# Patient Record
Sex: Female | Born: 1990
Health system: Southern US, Community
[De-identification: ages and names within clinical notes are randomized; demographics above are authoritative.]

## PROBLEM LIST (undated history)

## (undated) DIAGNOSIS — Z91018 Allergy to other foods: Secondary | ICD-10-CM

## (undated) DIAGNOSIS — E282 Polycystic ovarian syndrome: Secondary | ICD-10-CM

## (undated) DIAGNOSIS — J309 Allergic rhinitis, unspecified: Secondary | ICD-10-CM

## (undated) DIAGNOSIS — A749 Chlamydial infection, unspecified: Secondary | ICD-10-CM

## (undated) DIAGNOSIS — O139 Gestational [pregnancy-induced] hypertension without significant proteinuria, unspecified trimester: Secondary | ICD-10-CM

## (undated) DIAGNOSIS — Z789 Other specified health status: Secondary | ICD-10-CM

## (undated) DIAGNOSIS — N979 Female infertility, unspecified: Secondary | ICD-10-CM

## (undated) HISTORY — DX: Allergy to other foods: Z91.018

## (undated) HISTORY — DX: Female infertility, unspecified: N97.9

## (undated) HISTORY — DX: Allergic rhinitis, unspecified: J30.9

## (undated) HISTORY — DX: Gestational (pregnancy-induced) hypertension without significant proteinuria, unspecified trimester: O13.9

---
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
## 2014-02-21 ENCOUNTER — Encounter (HOSPITAL_COMMUNITY): Payer: Self-pay | Admitting: *Deleted

## 2014-09-28 LAB — HM PAP SMEAR: HM Pap smear: NEGATIVE

## 2015-01-02 ENCOUNTER — Other Ambulatory Visit: Payer: Self-pay | Admitting: Family Medicine

## 2015-01-02 ENCOUNTER — Telehealth: Payer: Self-pay | Admitting: Family Medicine

## 2015-01-02 DIAGNOSIS — T753XXA Motion sickness, initial encounter: Secondary | ICD-10-CM

## 2015-01-02 MED ORDER — SCOPOLAMINE 1 MG/3DAYS TD PT72: 1.0000 | MEDICATED_PATCH | TRANSDERMAL | Status: DC

## 2015-01-02 NOTE — Telephone Encounter (Signed)
Pt states she is going on a 8 day cruise next week and is requesting a Rx for the sea sick patches.  [facility] [address]  ZO#[phone]/WJ

## 2015-01-02 NOTE — Telephone Encounter (Signed)
Prescription request for motion sickness. KW

## 2015-03-22 ENCOUNTER — Telehealth: Payer: Self-pay

## 2015-03-22 NOTE — Telephone Encounter (Signed)
Patient is requesting a [facility] [address] Patient is going on a cruise on Sunday.  CB [phone]

## 2015-03-23 ENCOUNTER — Other Ambulatory Visit: Payer: Self-pay | Admitting: Family Medicine

## 2015-03-23 DIAGNOSIS — T753XXA Motion sickness, initial encounter: Secondary | ICD-10-CM

## 2015-03-23 MED ORDER — SCOPOLAMINE 1 MG/3DAYS TD PT72: 1.0000 | MEDICATED_PATCH | TRANSDERMAL | Status: DC

## 2015-03-23 NOTE — Telephone Encounter (Signed)
Refill sent in

## 2015-10-12 DIAGNOSIS — L718 Other rosacea: Secondary | ICD-10-CM | POA: Insufficient documentation

## 2015-10-12 DIAGNOSIS — J309 Allergic rhinitis, unspecified: Secondary | ICD-10-CM | POA: Insufficient documentation

## 2016-05-10 ENCOUNTER — Telehealth: Payer: Self-pay | Admitting: Family Medicine

## 2016-05-10 ENCOUNTER — Other Ambulatory Visit: Payer: Self-pay | Admitting: Family Medicine

## 2016-05-10 MED ORDER — EPINEPHRINE 0.3 MG/0.3ML IJ SOAJ: 0.3000 mg | Freq: Once | INTRAMUSCULAR | 0 refills | Status: AC

## 2016-05-10 NOTE — Telephone Encounter (Signed)
Patient was advised, patient also wants to know if she would need any "entry shots" to go to [place]?

## 2016-05-10 NOTE — Telephone Encounter (Signed)
Pt advised-aa 

## 2016-05-10 NOTE — Telephone Encounter (Signed)
Please review-aa 

## 2016-05-10 NOTE — Telephone Encounter (Signed)
Pt has not been seen since 08/11/2013.  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  Pt states she is going on her honeymoon and want to take this with her just in case she needs it.  [facility] [address]  AV#[phone]/WGCB#[phone]/MW

## 2016-05-10 NOTE — Telephone Encounter (Signed)
Refill of epipen completed. Please let patient know.

## 2016-05-10 NOTE — Telephone Encounter (Signed)
[facility] need [facility] for those answers.

## 2016-09-19 ENCOUNTER — Telehealth: Payer: Self-pay | Admitting: Family Medicine

## 2016-09-19 NOTE — Telephone Encounter (Signed)
Prefer not to accept her or new patients at this time.

## 2016-09-19 NOTE — Telephone Encounter (Signed)
Pt was last seen in our office 08/11/13 and is requesting to reestablish with Nadine CountsBob.  Pt is requesting a CPE.  Pt has [employer] insurance.  Pt has not been seen at any other office.  Please advise.  QI#[phone]/LKCB#[phone]/MW

## 2016-09-19 NOTE — Telephone Encounter (Signed)
Left message/MW °

## 2016-11-01 DIAGNOSIS — Z130 Encounter for screening for diseases of the blood and blood-forming organs and certain disorders involving the immune mechanism: Secondary | ICD-10-CM | POA: Diagnosis not present

## 2016-11-01 DIAGNOSIS — Z01419 Encounter for gynecological examination (general) (routine) without abnormal findings: Secondary | ICD-10-CM | POA: Diagnosis not present

## 2016-11-01 DIAGNOSIS — Z1389 Encounter for screening for other disorder: Secondary | ICD-10-CM | POA: Diagnosis not present

## 2016-11-01 DIAGNOSIS — Z6829 Body mass index (BMI) 29.0-29.9, adult: Secondary | ICD-10-CM | POA: Diagnosis not present

## 2016-11-01 DIAGNOSIS — Z30431 Encounter for routine checking of intrauterine contraceptive device: Secondary | ICD-10-CM | POA: Diagnosis not present

## 2016-11-04 DIAGNOSIS — M9905 Segmental and somatic dysfunction of pelvic region: Secondary | ICD-10-CM | POA: Diagnosis not present

## 2016-11-04 DIAGNOSIS — M5386 Other specified dorsopathies, lumbar region: Secondary | ICD-10-CM | POA: Diagnosis not present

## 2016-11-04 DIAGNOSIS — M9903 Segmental and somatic dysfunction of lumbar region: Secondary | ICD-10-CM | POA: Diagnosis not present

## 2016-11-04 DIAGNOSIS — M9902 Segmental and somatic dysfunction of thoracic region: Secondary | ICD-10-CM | POA: Diagnosis not present

## 2016-11-05 DIAGNOSIS — M9903 Segmental and somatic dysfunction of lumbar region: Secondary | ICD-10-CM | POA: Diagnosis not present

## 2016-11-05 DIAGNOSIS — M9902 Segmental and somatic dysfunction of thoracic region: Secondary | ICD-10-CM | POA: Diagnosis not present

## 2016-11-05 DIAGNOSIS — M9905 Segmental and somatic dysfunction of pelvic region: Secondary | ICD-10-CM | POA: Diagnosis not present

## 2016-11-05 DIAGNOSIS — M5386 Other specified dorsopathies, lumbar region: Secondary | ICD-10-CM | POA: Diagnosis not present

## 2016-11-21 DIAGNOSIS — Z209 Contact with and (suspected) exposure to unspecified communicable disease: Secondary | ICD-10-CM | POA: Diagnosis not present

## 2017-02-03 ENCOUNTER — Ambulatory Visit (INDEPENDENT_AMBULATORY_CARE_PROVIDER_SITE_OTHER): Payer: BLUE CROSS/BLUE SHIELD | Admitting: Family Medicine

## 2017-02-03 ENCOUNTER — Encounter: Payer: Self-pay | Admitting: Family Medicine

## 2017-02-03 VITALS — BP 102/70 | HR 92 | Temp 97.5°F | Resp 16 | Ht 62.0 in | Wt 169.0 lb

## 2017-02-03 DIAGNOSIS — J3089 Other allergic rhinitis: Secondary | ICD-10-CM | POA: Diagnosis not present

## 2017-02-03 DIAGNOSIS — Z7689 Persons encountering health services in other specified circumstances: Secondary | ICD-10-CM | POA: Diagnosis not present

## 2017-02-03 DIAGNOSIS — F5105 Insomnia due to other mental disorder: Secondary | ICD-10-CM

## 2017-02-03 DIAGNOSIS — F99 Mental disorder, not otherwise specified: Secondary | ICD-10-CM | POA: Diagnosis not present

## 2017-02-03 DIAGNOSIS — F411 Generalized anxiety disorder: Secondary | ICD-10-CM | POA: Diagnosis not present

## 2017-02-03 DIAGNOSIS — Z23 Encounter for immunization: Secondary | ICD-10-CM | POA: Diagnosis not present

## 2017-02-03 DIAGNOSIS — G4700 Insomnia, unspecified: Secondary | ICD-10-CM | POA: Insufficient documentation

## 2017-02-03 LAB — CBC WITH DIFFERENTIAL/PLATELET
BASOS ABS: 69 {cells}/uL (ref 0–200)
Basophils Relative: 1.3 %
EOS ABS: 265 {cells}/uL (ref 15–500)
Eosinophils Relative: 5 %
HCT: 42.1 % (ref 35.0–45.0)
HEMOGLOBIN: 14.4 g/dL (ref 11.7–15.5)
Lymphs Abs: 1776 cells/uL (ref 850–3900)
MCH: 28.5 pg (ref 27.0–33.0)
MCHC: 34.2 g/dL (ref 32.0–36.0)
MCV: 83.2 fL (ref 80.0–100.0)
MPV: 9.3 fL (ref 7.5–12.5)
Monocytes Relative: 8.6 %
NEUTROS ABS: 2735 {cells}/uL (ref 1500–7800)
NEUTROS PCT: 51.6 %
Platelets: 405 10*3/uL — ABNORMAL HIGH (ref 140–400)
RBC: 5.06 10*6/uL (ref 3.80–5.10)
RDW: 12.4 % (ref 11.0–15.0)
TOTAL LYMPHOCYTE: 33.5 %
WBC mixed population: 456 cells/uL (ref 200–950)
WBC: 5.3 10*3/uL (ref 3.8–10.8)

## 2017-02-03 LAB — COMPLETE METABOLIC PANEL WITH GFR
AG Ratio: 1.4 (calc) (ref 1.0–2.5)
ALBUMIN MSPROF: 4.4 g/dL (ref 3.6–5.1)
ALKALINE PHOSPHATASE (APISO): 51 U/L (ref 33–115)
ALT: 16 U/L (ref 6–29)
AST: 17 U/L (ref 10–30)
BILIRUBIN TOTAL: 0.6 mg/dL (ref 0.2–1.2)
BUN: 11 mg/dL (ref 7–25)
CHLORIDE: 103 mmol/L (ref 98–110)
CO2: 27 mmol/L (ref 20–32)
Calcium: 9.6 mg/dL (ref 8.6–10.2)
Creat: 0.75 mg/dL (ref 0.50–1.10)
GFR, Est African American: 129 mL/min/{1.73_m2} (ref >=60)
GFR, Est Non African American: 112 mL/min/{1.73_m2} (ref >=60)
Globulin: 3.1 g/dL (calc) (ref 1.9–3.7)
Glucose, Bld: 81 mg/dL (ref 65–99)
Potassium: 4.2 mmol/L (ref 3.5–5.3)
SODIUM: 140 mmol/L (ref 135–146)
Total Protein: 7.5 g/dL (ref 6.1–8.1)

## 2017-02-03 LAB — TSH: TSH: 2.12 m[IU]/L

## 2017-02-03 MED ORDER — SERTRALINE HCL 50 MG PO TABS: 50.0000 mg | ORAL_TABLET | Freq: Every day | ORAL | 3 refills | Status: DC

## 2017-02-03 MED ORDER — TRAZODONE HCL 50 MG PO TABS: 25.0000 mg | ORAL_TABLET | Freq: Every evening | ORAL | 3 refills | Status: DC | PRN

## 2017-02-03 NOTE — Assessment & Plan Note (Signed)
Uncontrolled and severe (see GAD score) Discussed therapy and synregistic effects with medication Discussed medication options Start SSRI - Zoloft and slowly titrate dose (see AVS) Contracted for safety Discussed potential side effects F/u in 1 month and consider further dose titration

## 2017-02-03 NOTE — Patient Instructions (Signed)
Start with Zoloft  daily and increase to  daily after 1 week  Can take Trazodone as needed for sleep.  Take 0.5-1 pill 1-2 hours before bedtime as needed.   Generalized Anxiety Disorder, Adult Generalized anxiety disorder (GAD) is a mental health disorder. People with this condition constantly worry about everyday events. Unlike normal anxiety, worry related to GAD is not triggered by a specific event. These worries also do not fade or get better with time. GAD interferes with life functions, including relationships, work, and school. GAD can vary from mild to severe. People with severe GAD can have intense waves of anxiety with physical symptoms (panic attacks). What are the causes? The exact cause of GAD is not known. What increases the risk? This condition is more likely to develop in:  Women.  People who have a family history of anxiety disorders.  People who are very shy.  People who experience very stressful life events, such as the death of a loved one.  People who have a very stressful family environment.  What are the signs or symptoms? People with GAD often worry excessively about many things in their lives, such as their health and family. They may also be overly concerned about:  Doing well at work.  Being on time.  Natural disasters.  Friendships.  Physical symptoms of GAD include:  Fatigue.  Muscle tension or having muscle twitches.  Trembling or feeling shaky.  Being easily startled.  Feeling like your heart is pounding or racing.  Feeling out of breath or like you cannot take a deep breath.  Having trouble falling asleep or staying asleep.  Sweating.  Nausea, diarrhea, or irritable bowel syndrome (IBS).  Headaches.  Trouble concentrating or remembering facts.  Restlessness.  Irritability.  How is this diagnosed? Your health care provider can diagnose GAD based on your symptoms and medical history. You will also have a physical  exam. The health care provider will ask specific questions about your symptoms, including how severe they are, when they started, and if they come and go. Your health care provider may ask you about your use of alcohol or drugs, including prescription medicines. Your health care provider may refer you to a mental health specialist for further evaluation. Your health care provider will do a thorough examination and may perform additional tests to rule out other possible causes of your symptoms. To be diagnosed with GAD, a person must have anxiety that:  Is out of his or her control.  Affects several different aspects of his or her life, such as work and relationships.  Causes distress that makes him or her unable to take part in normal activities.  Includes at least three physical symptoms of GAD, such as restlessness, fatigue, trouble concentrating, irritability, muscle tension, or sleep problems.  Before your health care provider can confirm a diagnosis of GAD, these symptoms must be present more days than they are not, and they must last for six months or longer. How is this treated? The following therapies are usually used to treat GAD:  Medicine. Antidepressant medicine is usually prescribed for long-term daily control. Antianxiety medicines may be added in severe cases, especially when panic attacks occur.  Talk therapy (psychotherapy). Certain types of talk therapy can be helpful in treating GAD by providing support, education, and guidance. Options include: ? Cognitive behavioral therapy (CBT). People learn coping skills and techniques to ease their anxiety. They learn to identify unrealistic or negative thoughts and behaviors and to replace them with  positive ones. ? Acceptance and commitment therapy (ACT). This treatment teaches people how to be mindful as a way to cope with unwanted thoughts and feelings. ? Biofeedback. This process trains you to manage your body's response  (physiological response) through breathing techniques and relaxation methods. You will work with a therapist while machines are used to monitor your physical symptoms.  Stress management techniques. These include yoga, meditation, and exercise.  A mental health specialist can help determine which treatment is best for you. Some people see improvement with one type of therapy. However, other people require a combination of therapies. Follow these instructions at home:  Take over-the-counter and prescription medicines only as told by your health care provider.  Try to maintain a normal routine.  Try to anticipate stressful situations and allow extra time to manage them.  Practice any stress management or self-calming techniques as taught by your health care provider.  Do not punish yourself for setbacks or for not making progress.  Try to recognize your accomplishments, even if they are small.  Keep all follow-up visits as told by your health care provider. This is important. Contact a health care provider if:  Your symptoms do not get better.  Your symptoms get worse.  You have signs of depression, such as: ? A persistently sad, cranky, or irritable mood. ? Loss of enjoyment in activities that used to bring you joy. ? Change in weight or eating. ? Changes in sleeping habits. ? Avoiding friends or family members. ? Loss of energy for normal tasks. ? Feelings of guilt or worthlessness. Get help right away if:  You have serious thoughts about hurting yourself or others. If you ever feel like you may hurt yourself or others, or have thoughts about taking your own life, get help right away. You can go to your nearest emergency department or call:  Your local emergency services (911 in the [place]  A suicide crisis helpline, such as the National Suicide Prevention Lifeline at 904-299-7300. This is open 24 hours a day.  Summary  Generalized anxiety disorder (GAD) is a mental  health disorder that involves worry that is not triggered by a specific event.  People with GAD often worry excessively about many things in their lives, such as their health and family.  GAD may cause physical symptoms such as restlessness, trouble concentrating, sleep problems, frequent sweating, nausea, diarrhea, headaches, and trembling or muscle twitching.  A mental health specialist can help determine which treatment is best for you. Some people see improvement with one type of therapy. However, other people require a combination of therapies. This information is not intended to replace advice given to you by your health care provider. Make sure you discuss any questions you have with your health care provider. Document Released: 08/03/2012 Document Revised: 02/27/2016 Document Reviewed: 02/27/2016 Elsevier Interactive Patient Education  [employer].

## 2017-02-03 NOTE — Assessment & Plan Note (Signed)
Most likely due to anxiety Discussed sleep hygiene Trial of trazodone prn F/u in 1 month Plan in the future to d/c trazodone after anxiety well controlled with SSRI

## 2017-02-03 NOTE — Assessment & Plan Note (Signed)
Well controlled Continue prn antihistamine

## 2017-02-03 NOTE — Progress Notes (Signed)
Patient: Samantha Bailey Female    DOB: 08/08/1990   26 y.o.   MRN: [id number] Visit Date: 02/03/2017  Today's Provider: Shirlee Latch, MD   Chief Complaint  Patient presents with  . Anxiety   Subjective:      Samantha Bailey presents to reestablish care. She previously saw Samantha Arthurs, PA for acute issues. She is c/o anxiety today.  Anxiety  Presents for initial visit. 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 The problem has been gradually worsening. Symptoms include decreased concentration, excessive worry, insomnia, irritability, nervous/anxious behavior and restlessness. Patient reports no chest pain (is c/o chest heaviness), compulsions, confusion, depressed mood, dizziness, dry mouth, feeling of choking, hyperventilation, impotence, malaise, muscle tension, nausea, obsessions, palpitations, panic, shortness of breath or suicidal ideas. The severity of symptoms is mild. Exacerbated by: nursing school stress. The quality of sleep is fair (Sometimes sleep is non-restorative).   There is no history of anemia, anxiety/panic attacks, depression or suicide attempts. Past treatments include nothing.   Feels like school is overwhelming.  Small things like dog having an accident make her cry.  Lays in bed worried about upcoming events and studying.  Feels guilty any time she is not studying.  Has always had trouble concentrating, but it has never affected her life or her grades.   No Known Allergies   Current Outpatient Prescriptions:  .  Levonorgestrel 13.5 MG IUD, 13.5 mg by Intrauterine route once., Disp: , Rfl:  .  sertraline (ZOLOFT) 50 MG tablet, Take 1 tablet (50 mg total) by mouth daily., Disp: 30 tablet, Rfl: 3 .  traZODone (DESYREL) 50 MG tablet, Take 0.5-1 tablets (25-50 mg total) by mouth at bedtime as needed for sleep., Disp: 30 tablet, Rfl: 3  Review of Systems  Constitutional: Positive for irritability. Negative for activity change, appetite change, chills,  diaphoresis, fatigue, fever and unexpected weight change.  HENT: Negative.   Eyes: Negative.   Respiratory: Negative.  Negative for shortness of breath.   Cardiovascular: Negative.  Negative for chest pain (is c/o chest heaviness) and palpitations.  Gastrointestinal: Negative.  Negative for nausea.  Endocrine: Negative.   Genitourinary: Negative.  Negative for impotence.  Musculoskeletal: Negative.   Skin: Negative.   Allergic/Immunologic: Negative.   Neurological: Negative.  Negative for dizziness.  Hematological: Negative.   Psychiatric/Behavioral: Positive for decreased concentration and sleep disturbance. Negative for agitation, behavioral problems, confusion, dysphoric mood, hallucinations, self-injury and suicidal ideas. The patient is nervous/anxious and has insomnia. The patient is not hyperactive.    Past Medical History:  Diagnosis Date  . Allergic rhinitis   . Chlamydia 2008   Past Surgical History:  Procedure Laterality Date  . WISDOM TOOTH EXTRACTION  2013   no problems with anesthesia   Family History  Problem Relation Age of Onset  . Healthy Mother   . Healthy Father   . Breast cancer Neg Hx   . Cancer - Colon Neg Hx   . Cervical cancer Neg Hx     Social History  Substance Use Topics  . Smoking status: Never Smoker  . Smokeless tobacco: Never Used  . Alcohol use No   Objective:   BP 102/70 (BP Location: Left Arm, Patient Position: Sitting, Cuff Size: Normal)   Pulse 92   Temp (!) 97.5 F (36.4 C) (Oral)   Resp 16   Ht  (1.575 m)   Wt 169 lb (76.7 kg)   Breastfeeding? Unknown   BMI 30.91 kg/m  Vitals:  02/03/17 1100  BP: 102/70  Pulse: 92  Resp: 16  Temp: (!) 97.5 F (36.4 C)  TempSrc: Oral  Weight: 169 lb (76.7 kg)  Height:  (1.575 m)     Physical Exam  Constitutional: She is oriented to person, place, and time. She appears well-developed and well-nourished. No distress.  HENT:  Head: Normocephalic and atraumatic.  Right  Ear: External ear normal.  Left Ear: External ear normal.  Nose: Nose normal.  Mouth/Throat: Oropharynx is clear and moist. No oropharyngeal exudate.  Eyes: Pupils are equal, round, and reactive to light. Conjunctivae are normal. No scleral icterus.  Neck: Neck supple. No thyromegaly present.  Cardiovascular: Normal rate, regular rhythm, normal heart sounds and intact distal pulses.   No murmur heard. Pulmonary/Chest: Effort normal and breath sounds normal. No respiratory distress. She has no wheezes. She has no rales.  Abdominal: Soft. Bowel sounds are normal. She exhibits no distension. There is no tenderness. There is no rebound and no guarding.  Musculoskeletal: She exhibits no edema or deformity.  Lymphadenopathy:    She has no cervical adenopathy.  Neurological: She is alert and oriented to person, place, and time.  Skin: Skin is warm and dry. No rash noted.  Psychiatric: Her behavior is normal.  Anxious. Appropriate groom and dress. No SI/HI.  No evidence of AVH  Vitals reviewed.    Depression screen PHQ 2/9 02/03/2017  Decreased Interest 0  Down, Depressed, Hopeless 0  PHQ - 2 Score 0    GAD 7 : Generalized Anxiety Score 02/03/2017  Nervous, Anxious, on Edge 3  Control/stop worrying 3  Worry too much - different things 3  Trouble relaxing 3  Restless 1  Easily annoyed or irritable 3  Afraid - awful might happen 3  Total GAD 7 Score 19  Anxiety Difficulty Extremely difficult        Assessment & Plan:      Problem List Items Addressed This Visit      Respiratory   Allergic rhinitis    Well controlled Continue prn antihistamine        Other   GAD (generalized anxiety disorder)    Uncontrolled and severe (see GAD score) Discussed therapy and synregistic effects with medication Discussed medication options Start SSRI - Zoloft and slowly titrate dose (see AVS) Contracted for safety Discussed potential side effects F/u in 1 month and consider further dose  titration      Relevant Medications   sertraline (ZOLOFT) 50 MG tablet   traZODone (DESYREL) 50 MG tablet   Other Relevant Orders   CBC w/Diff/Platelet   COMPLETE METABOLIC PANEL WITH GFR   TSH   Insomnia    Most likely due to anxiety Discussed sleep hygiene Trial of trazodone prn F/u in 1 month Plan in the future to d/c trazodone after anxiety well controlled with SSRI       Other Visit Diagnoses    Encounter to establish care    -  Primary   Relevant Orders   CBC w/Diff/Platelet   COMPLETE METABOLIC PANEL WITH GFR   TSH   Need for immunization against influenza       Relevant Orders   Flu Vaccine QUAD 36+ mos IM (Completed)      Return in about 4 weeks (around 03/03/2017) for anxiety.      The entirety of the information documented in the History of Present Illness, Review of Systems and Physical Exam were personally obtained by me. Portions of this information were  initially documented by Martha Clan, CMA and reviewed by me for thoroughness and accuracy.     Lavon Paganini, MD  [facility]

## 2017-02-17 ENCOUNTER — Telehealth: Payer: Self-pay | Admitting: Family Medicine

## 2017-02-17 DIAGNOSIS — F321 Major depressive disorder, single episode, moderate: Secondary | ICD-10-CM

## 2017-02-17 MED ORDER — BUPROPION HCL ER (XL) 150 MG PO TB24: 150.0000 mg | ORAL_TABLET | Freq: Every day | ORAL | 0 refills | Status: DC

## 2017-02-17 NOTE — Telephone Encounter (Signed)
Can you please review 

## 2017-02-17 NOTE — Telephone Encounter (Signed)
Left message advising pt. 

## 2017-02-17 NOTE — Telephone Encounter (Signed)
Pt requesting a change from Zoloft to Wellbutrin.  Pt states the Zoloft is making her sleepy, pt states she has tried taking in the morning and at night and there is no difference in the sleepiness.    The patient states she has not taken any of the sleeping pills that were prescribed.

## 2017-02-17 NOTE — Telephone Encounter (Signed)
I am willing to switch to wellbutrin but patient will have to monitor as it may worsen anxiety.

## 2017-03-04 ENCOUNTER — Ambulatory Visit: Payer: Self-pay | Admitting: Family Medicine

## 2017-03-06 ENCOUNTER — Encounter: Payer: Self-pay | Admitting: Emergency Medicine

## 2017-03-07 ENCOUNTER — Telehealth: Payer: Self-pay

## 2017-03-07 MED ORDER — EPINEPHRINE 0.3 MG/0.3ML IJ SOAJ: 0.3000 mg | Freq: Once | INTRAMUSCULAR | 1 refills | Status: AC

## 2017-03-07 NOTE — Telephone Encounter (Signed)
Rx sent to pharmacy   

## 2017-03-07 NOTE — Telephone Encounter (Signed)
Patient called stating that she has Epi pen - usually 2 pens per box, for shell fish allergy. Prescription expired and she wanted to see if we could refill it and [facility] [address] [place]

## 2017-07-29 ENCOUNTER — Telehealth: Payer: Self-pay | Admitting: Family Medicine

## 2017-07-29 NOTE — Telephone Encounter (Signed)
[facility]

## 2017-11-04 DIAGNOSIS — Z124 Encounter for screening for malignant neoplasm of cervix: Secondary | ICD-10-CM | POA: Diagnosis not present

## 2017-11-04 DIAGNOSIS — Z01419 Encounter for gynecological examination (general) (routine) without abnormal findings: Secondary | ICD-10-CM | POA: Diagnosis not present

## 2017-11-04 DIAGNOSIS — Z130 Encounter for screening for diseases of the blood and blood-forming organs and certain disorders involving the immune mechanism: Secondary | ICD-10-CM | POA: Diagnosis not present

## 2017-11-04 DIAGNOSIS — Z30431 Encounter for routine checking of intrauterine contraceptive device: Secondary | ICD-10-CM | POA: Diagnosis not present

## 2017-11-04 DIAGNOSIS — N393 Stress incontinence (female) (male): Secondary | ICD-10-CM | POA: Diagnosis not present

## 2017-11-04 DIAGNOSIS — Z368A Encounter for antenatal screening for other genetic defects: Secondary | ICD-10-CM | POA: Diagnosis not present

## 2017-11-04 DIAGNOSIS — Z1389 Encounter for screening for other disorder: Secondary | ICD-10-CM | POA: Diagnosis not present

## 2017-11-04 LAB — HM PAP SMEAR: HM Pap smear: NEGATIVE

## 2018-02-23 DIAGNOSIS — H6123 Impacted cerumen, bilateral: Secondary | ICD-10-CM | POA: Diagnosis not present

## 2018-02-23 DIAGNOSIS — J358 Other chronic diseases of tonsils and adenoids: Secondary | ICD-10-CM | POA: Diagnosis not present

## 2018-02-23 DIAGNOSIS — H6060 Unspecified chronic otitis externa, unspecified ear: Secondary | ICD-10-CM | POA: Diagnosis not present

## 2018-02-23 DIAGNOSIS — R42 Dizziness and giddiness: Secondary | ICD-10-CM | POA: Diagnosis not present

## 2018-03-30 ENCOUNTER — Telehealth: Payer: Self-pay | Admitting: Family Medicine

## 2018-03-30 NOTE — Telephone Encounter (Signed)
This is fine, please schedule for physical and PAP as she is due.

## 2018-03-30 NOTE — Telephone Encounter (Signed)
Pt wanting to move PCP care from Dr. Leonard SchwartzB to Osvaldo AngstAdriana Pollak.  Pt feels Dr. B wasn't a good fit for her to continue PCP care.   Pt is now needing a CPE and wants to get the appointment with Adriana.  Please advise.  Thanks, [employer]

## 2018-03-30 NOTE — Telephone Encounter (Signed)
That's fine, but Ricki Rodriguezdriana will need to confirm.  Patient was supposed to f/u in 1 month last October for anxiety, of note.  Erasmo DownerBacigalupo, Keilyn Nadal M, MD, MPH [facility] 03/30/2018 3:06 PM

## 2018-03-31 NOTE — Telephone Encounter (Signed)
Patient was advised and schedule appointment on 04/08/2018.

## 2018-04-08 ENCOUNTER — Ambulatory Visit (INDEPENDENT_AMBULATORY_CARE_PROVIDER_SITE_OTHER): Payer: BLUE CROSS/BLUE SHIELD | Admitting: Physician Assistant

## 2018-04-08 ENCOUNTER — Encounter: Payer: Self-pay | Admitting: Physician Assistant

## 2018-04-08 VITALS — BP 116/78 | HR 94 | Temp 97.9°F | Wt 177.0 lb

## 2018-04-08 DIAGNOSIS — Z131 Encounter for screening for diabetes mellitus: Secondary | ICD-10-CM

## 2018-04-08 DIAGNOSIS — Z0000 Encounter for general adult medical examination without abnormal findings: Secondary | ICD-10-CM | POA: Diagnosis not present

## 2018-04-08 DIAGNOSIS — Z13220 Encounter for screening for lipoid disorders: Secondary | ICD-10-CM

## 2018-04-08 DIAGNOSIS — Z130 Encounter for screening for diseases of the blood and blood-forming organs and certain disorders involving the immune mechanism: Secondary | ICD-10-CM | POA: Diagnosis not present

## 2018-04-08 DIAGNOSIS — Z111 Encounter for screening for respiratory tuberculosis: Secondary | ICD-10-CM

## 2018-04-08 DIAGNOSIS — T7802XD Anaphylactic reaction due to shellfish (crustaceans), subsequent encounter: Secondary | ICD-10-CM

## 2018-04-08 DIAGNOSIS — Z1329 Encounter for screening for other suspected endocrine disorder: Secondary | ICD-10-CM | POA: Diagnosis not present

## 2018-04-08 DIAGNOSIS — Z23 Encounter for immunization: Secondary | ICD-10-CM | POA: Diagnosis not present

## 2018-04-08 DIAGNOSIS — Z114 Encounter for screening for human immunodeficiency virus [HIV]: Secondary | ICD-10-CM

## 2018-04-08 MED ORDER — EPINEPHRINE 0.3 MG/0.3ML IJ SOAJ: 0.3000 mg | Freq: Once | INTRAMUSCULAR | 0 refills | Status: AC

## 2018-04-08 NOTE — Progress Notes (Signed)
Patient: Samantha Bailey, Female    DOB: 1990/06/12, 27 y.o.   MRN: [id number] Visit Date: 04/08/2018  Today's Provider: Trinna Post, PA-C   Chief Complaint  Patient presents with  . Annual Exam   Subjective:    Annual physical exam Samantha Bailey is a 27 y.o. female who presents today for health maintenance and complete physical. She feels well. She reports exercising includes walking. She reports she is sleeping well.  She is living in [place]. Has been married two years. She has no children. She has an IUD placed by [facility]. She had a PAP last year from this provider as well. She reports it was normal.   She is due for tetanus and a flu shot today.   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 She will finish in July. She needs a TB test for this.   Last year when she was very stressed with nursing school she was prescribed trazodone and wellbutrin. She reports she has not needed them and is not taking them, it was situational and she feels a great deal better now.   Reports she has an allergy to shellfish, throat becomes itchy, happened when she was a little girl. She has been allergy tested. Has expired epi-pen that she has never had to use. Reports she reached out to ENT but they told her she needed new allergy testing to renew the script. Requesting epi-pen today.   -----------------------------------------------------------------   Review of Systems  Constitutional: Negative.   HENT: Negative.   Eyes: Negative.   Respiratory: Negative.   Cardiovascular: Negative.   Gastrointestinal: Negative.   Endocrine: Negative.   Genitourinary: Negative.   Musculoskeletal: Negative.   Skin: Negative.   Allergic/Immunologic: Positive for food allergies.  Neurological: Negative.   Hematological: Negative.   Psychiatric/Behavioral: Negative.     Social History She  reports that she has  never smoked. She has never used smokeless tobacco. She reports that she does not drink alcohol or use drugs. Social History   Socioeconomic History  . Marital status: Married    Spouse name: Broadus John  . Number of children: 0  . Years of education: college  . Highest education level: Not on file  Occupational History    Employer: Bartender    Comment: [employer]  . 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 Financial resource strain: Not on file  . Food insecurity:    Worry: Not on file    Inability: Not on file  . Transportation needs:    Medical: Not on file    Non-medical: Not on file  Tobacco Use  . Smoking status: Never Smoker  . Smokeless tobacco: Never Used  Substance and Sexual Activity  . Alcohol use: No  . Drug use: No  . Sexual activity: Yes    Partners: Male    Birth control/protection: I.U.D.  Lifestyle  . Physical activity:    Days per week: Not on file    Minutes per session: Not on file  . Stress: Not on file  Relationships  . Social connections:    Talks on phone: Not on file    Gets together: Not on file    Attends religious service: Not on file    Active member of club or organization: Not on file    Attends meetings of clubs  or organizations: Not on file    Relationship status: Not on file  Other Topics Concern  . Not on file  Social History Narrative  . Not on file    Patient Active Problem List   Diagnosis Date Noted  . GAD (generalized anxiety disorder) 02/03/2017  . Insomnia 02/03/2017  . Acne rosacea, papular type 10/12/2015  . Allergic rhinitis 10/12/2015    Past Surgical History:  Procedure Laterality Date  . [place] EXTRACTION  2013   no problems with anesthesia    Family History  Family Status  Relation Name Status  . Mother  Alive  . Father  Alive  . Neg Hx  (Not Specified)   Her family history includes Healthy in her father and mother.     Allergies  Allergen  Reactions  . Shellfish Allergy Swelling    Previous Medications   LEVONORGESTREL 13.5 MG IUD    13.5 mg once by Intrauterine route. Skla Placed on 12/29/2015    Patient Care Team: Paulene Floor as PCP - General (Physician Assistant)      Objective:   Vitals: BP 116/78 (BP Location: Right Arm, Patient Position: Sitting, Cuff Size: Normal)   Pulse 94   Temp 97.9 F (36.6 C) (Oral)   Wt 177 lb (80.3 kg)   BMI 32.37 kg/m    Physical Exam Constitutional:      Appearance: Normal appearance. She is obese.  HENT:     Right Ear: Tympanic membrane and ear canal normal.     Left Ear: Tympanic membrane and ear canal normal.     Mouth/Throat:     Mouth: Mucous membranes are moist.  Neck:     Musculoskeletal: Neck supple.  Cardiovascular:     Rate and Rhythm: Normal rate and regular rhythm.     Heart sounds: Normal heart sounds.  Pulmonary:     Effort: Pulmonary effort is normal.     Breath sounds: Normal breath sounds.  Skin:    General: Skin is warm and dry.  Neurological:     General: No focal deficit present.     Mental Status: She is alert and oriented to person, place, and time.  Psychiatric:        Mood and Affect: Mood normal.        Behavior: Behavior normal.      Depression Screen PHQ 2/9 Scores 04/08/2018 02/03/2017  PHQ - 2 Score 0 0  PHQ- 9 Score 0 -      Assessment & Plan:     Routine Health Maintenance and Physical Exam  Exercise Activities and Dietary recommendations Goals   None     Immunization History  Administered Date(s) Administered  . DTaP 12/08/1992, 04/09/1993, 08/02/1993, 12/16/1996  . HPV Quadrivalent 02/24/2007, 05/11/2007, 11/17/2007  . Hepatitis B 06-16-1990, 12/08/1992, 01/14/1994  . IPV 12/08/1992, 04/09/1993, 08/02/1993, 12/16/1996  . Influenza,inj,Quad PF,6+ Mos 02/03/2017, 04/08/2018  . MMR 09/13/1993, 12/16/1996, 10/11/2016  . Meningococcal Conjugate 02/24/2007  . Td 04/08/2018  . Tdap 02/24/2007    Health  Maintenance  Topic Date Due  . HIV Screening  10/09/2007  . TETANUS/TDAP  02/23/2017  . PAP-Cervical Cytology Screening  09/27/2017  . PAP SMEAR-Modifier  09/27/2017  . INFLUENZA VACCINE  11/20/2017     Discussed health benefits of physical activity, and encouraged her to engage in regular exercise appropriate for her age and condition.   1. Annual physical exam  PAP with [facility] [address] in [place]. Requesting records  to update PAP.   2. Anaphylactic shock due to shellfish, subsequent encounter  - EPINEPHrine 0.3 mg/0.3 mL IJ SOAJ injection; Inject 0.3 mLs (0.3 mg total) into the muscle once for 1 dose.  Dispense: 1 Device; Refill: 0  3. Screening for deficiency anemia  - CBC With Differential  4. Thyroid disorder screening  - TSH  5. Screening cholesterol level  - Lipid Profile  6. Encounter for screening for HIV  - HIV antibody (with reflex)  7. Screening-pulmonary TB  - QuantiFERON-TB Gold Plus  8. Diabetes mellitus screening  - Comprehensive Metabolic Panel (CMET)  9. Need for influenza vaccination  - Flu Vaccine QUAD 36+ mos IM  10. Need for vaccine for Td (tetanus-diphtheria)  - Td vaccine greater than or equal to 7yo preservative free IM  Return in about 1 year (around 04/09/2019) for cpE.  The entirety of the information documented in the History of Present Illness, Review of Systems and Physical Exam were personally obtained by me. Portions of this information were initially documented by Hurman Horn, CMA and reviewed by me for thoroughness and accuracy.       --------------------------------------------------------------------

## 2018-04-14 LAB — LIPID PANEL
Chol/HDL Ratio: 2.4 ratio (ref 0.0–4.4)
Cholesterol, Total: 127 mg/dL (ref 100–199)
HDL: 53 mg/dL (ref >39)
LDL Calculated: 56 mg/dL (ref 0–99)
Triglycerides: 88 mg/dL (ref 0–149)
VLDL Cholesterol Cal: 18 mg/dL (ref 5–40)

## 2018-04-14 LAB — CBC WITH DIFFERENTIAL
Basophils Absolute: 0.1 10*3/uL (ref 0.0–0.2)
Basos: 1 %
EOS (ABSOLUTE): 0.4 10*3/uL (ref 0.0–0.4)
Eos: 7 %
Hematocrit: 41.9 % (ref 34.0–46.6)
Hemoglobin: 13.7 g/dL (ref 11.1–15.9)
Immature Grans (Abs): 0 10*3/uL (ref 0.0–0.1)
Immature Granulocytes: 0 %
Lymphocytes Absolute: 1.9 10*3/uL (ref 0.7–3.1)
Lymphs: 30 %
MCH: 28.3 pg (ref 26.6–33.0)
MCHC: 32.7 g/dL (ref 31.5–35.7)
MCV: 87 fL (ref 79–97)
Monocytes Absolute: 0.4 10*3/uL (ref 0.1–0.9)
Monocytes: 7 %
Neutrophils Absolute: 3.6 10*3/uL (ref 1.4–7.0)
Neutrophils: 55 %
RBC: 4.84 x10E6/uL (ref 3.77–5.28)
RDW: 12.3 % (ref 12.3–15.4)
WBC: 6.3 10*3/uL (ref 3.4–10.8)

## 2018-04-14 LAB — COMPREHENSIVE METABOLIC PANEL
ALT: 13 IU/L (ref 0–32)
AST: 16 IU/L (ref 0–40)
Albumin/Globulin Ratio: 1.8 (ref 1.2–2.2)
Albumin: 4.4 g/dL (ref 3.5–5.5)
Alkaline Phosphatase: 41 IU/L (ref 39–117)
BUN/Creatinine Ratio: 10 (ref 9–23)
BUN: 8 mg/dL (ref 6–20)
Bilirubin Total: 0.4 mg/dL (ref 0.0–1.2)
CO2: 21 mmol/L (ref 20–29)
Calcium: 9.4 mg/dL (ref 8.7–10.2)
Chloride: 103 mmol/L (ref 96–106)
Creatinine, Ser: 0.8 mg/dL (ref 0.57–1.00)
GFR calc Af Amer: 119 mL/min/{1.73_m2} (ref >59)
GFR calc non Af Amer: 103 mL/min/{1.73_m2} (ref >59)
Globulin, Total: 2.5 g/dL (ref 1.5–4.5)
Glucose: 76 mg/dL (ref 65–99)
Potassium: 3.6 mmol/L (ref 3.5–5.2)
Sodium: 139 mmol/L (ref 134–144)
Total Protein: 6.9 g/dL (ref 6.0–8.5)

## 2018-04-14 LAB — QUANTIFERON-TB GOLD PLUS
QuantiFERON Mitogen Value: >10 IU/mL
QuantiFERON Nil Value: 0.02 IU/mL
QuantiFERON TB1 Ag Value: 0.02 IU/mL
QuantiFERON TB2 Ag Value: 0.02 IU/mL
QuantiFERON-TB Gold Plus: NEGATIVE

## 2018-04-14 LAB — HIV ANTIBODY (ROUTINE TESTING W REFLEX): HIV Screen 4th Generation wRfx: NONREACTIVE

## 2018-04-14 LAB — TSH: TSH: 3.34 u[IU]/mL (ref 0.450–4.500)

## 2018-04-17 ENCOUNTER — Telehealth: Payer: Self-pay

## 2018-04-17 NOTE — Telephone Encounter (Signed)
Patient advised as directed below. 

## 2018-04-17 NOTE — Telephone Encounter (Signed)
-----   Message from Malva Limesonald E Fisher, MD sent at 04/17/2018 10:46 AM EST ----- Labs all normal. Cholesterol very good. hiv and TB screening negative.

## 2018-04-17 NOTE — Telephone Encounter (Signed)
LMTCB  Thanks,  -Lemoine Goyne 

## 2018-04-18 ENCOUNTER — Encounter: Payer: Self-pay | Admitting: Physician Assistant

## 2018-04-20 ENCOUNTER — Encounter: Payer: Self-pay | Admitting: Physician Assistant

## 2018-06-10 DIAGNOSIS — Z23 Encounter for immunization: Secondary | ICD-10-CM | POA: Diagnosis not present

## 2018-06-10 DIAGNOSIS — D239 Other benign neoplasm of skin, unspecified: Secondary | ICD-10-CM | POA: Diagnosis not present

## 2018-06-10 DIAGNOSIS — D225 Melanocytic nevi of trunk: Secondary | ICD-10-CM | POA: Diagnosis not present

## 2018-06-15 DIAGNOSIS — H6123 Impacted cerumen, bilateral: Secondary | ICD-10-CM | POA: Diagnosis not present

## 2018-06-15 DIAGNOSIS — H6060 Unspecified chronic otitis externa, unspecified ear: Secondary | ICD-10-CM | POA: Diagnosis not present

## 2018-07-02 ENCOUNTER — Ambulatory Visit
Admission: EM | Admit: 2018-07-02 | Discharge: 2018-07-02 | Disposition: A | Discharge status: Home / Self Care | Payer: BLUE CROSS/BLUE SHIELD | Attending: Family Medicine | Admitting: Family Medicine

## 2018-07-02 DIAGNOSIS — R112 Nausea with vomiting, unspecified: Secondary | ICD-10-CM | POA: Diagnosis not present

## 2018-07-02 DIAGNOSIS — R69 Illness, unspecified: Secondary | ICD-10-CM

## 2018-07-02 DIAGNOSIS — J111 Influenza due to unidentified influenza virus with other respiratory manifestations: Secondary | ICD-10-CM

## 2018-07-02 LAB — POCT INFLUENZA A/B
Influenza A, POC: NEGATIVE
Influenza B, POC: NEGATIVE

## 2018-07-02 MED ORDER — IBUPROFEN 600 MG PO TABS: 600.0000 mg | ORAL_TABLET | Freq: Four times a day (QID) | ORAL | 0 refills | Status: DC | PRN

## 2018-07-02 MED ORDER — ONDANSETRON 4 MG PO TBDP: 4.0000 mg | ORAL_TABLET | Freq: Three times a day (TID) | ORAL | 0 refills | Status: DC | PRN

## 2018-07-02 NOTE — ED Triage Notes (Signed)
Pt c/o n/v, body aches and chills since yesterday

## 2018-07-02 NOTE — Discharge Instructions (Signed)
Symptoms are most likely viral, possible flu versus viral stomach bug Please alternate Tylenol and ibuprofen every 4 hours to help with fever, body aches and chills Please use Zofran as needed every 8 hours, dissolves underneath tongue, use for nausea and vomiting Push fluids, may get Pedialyte or Gatorade if not eating a lot of solids, slowly transition to bland diet-rice toast crackers soups potatoes, avoid spicy and greasy  If developing cold symptoms may use over-the-counter Zyrtec/Claritin, Flonase or Mucinex for congestion; Delsym or Robitussin for cough  Rest, drink plenty of fluids  Follow-up if symptoms not resolving, worsening, developing persistent fevers, inability to eat or drink despite use of Zofran

## 2018-07-03 NOTE — ED Provider Notes (Addendum)
[facility]    CSN: [id number] Arrival date & time: 07/02/18  1243     History   Chief Complaint Chief Complaint  Patient presents with  . Influenza    HPI Samantha Bailey is a 28 y.o. female history of allergic rhinitis, generalized anxiety disorder presenting today for evaluation of body aches and chills.  Patient states that for the past 1 to 2 days she has had chills, body aches.  She is also had nausea and vomiting.  She had one episode of vomiting this morning.  She has tried an over-the-counter nausea medicine without relief.  Patient denies any URI symptoms of congestion cough or sore throat.  Patient does states that she is currently in clinicals for nursing school and was exposed to flu earlier in the week.  She has not checked her temperature at home.  Patient does not have regular menstrual cycles that she has an IUD in place.  Denies urinary symptoms or pelvic symptoms.  Denies vaginal discharge.  Denies diarrhea or change in bowel movements.  Denies straining. Denies any recent travel, denies any known exposure to COVID-19.    HPI  Past Medical History:  Diagnosis Date  . Allergic rhinitis   . Chlamydia 2008    Patient Active Problem List   Diagnosis Date Noted  . GAD (generalized anxiety disorder) 02/03/2017  . Insomnia 02/03/2017  . Acne rosacea, papular type 10/12/2015  . Allergic rhinitis 10/12/2015    Past Surgical History:  Procedure Laterality Date  . WISDOM TOOTH EXTRACTION  2013   no problems with anesthesia    OB History    Gravida  1   Para      Term      Preterm      AB  1   Living  0     SAB  1   TAB      Ectopic      Multiple      Live Births               Home Medications    Prior to Admission medications   Medication Sig Start Date End Date Taking? Authorizing Provider  ibuprofen (ADVIL,MOTRIN) 600 MG tablet Take 1 tablet (600 mg total) by mouth every 6 (six) hours as needed. 07/02/18   Zacari Stiff  C, PA-C  Levonorgestrel 13.5 MG IUD 13.5 mg once by Intrauterine route. Skla Placed on 12/29/2015 01/22/16   [provider]  ondansetron (ZOFRAN-ODT) 4 MG disintegrating tablet Take 1 tablet (4 mg total) by mouth every 8 (eight) hours as needed for nausea or vomiting. 07/02/18   Tzion Wedel, Junius Creamer, PA-C    Family History Family History  Problem Relation Age of Onset  . Healthy Mother   . Healthy Father   . Breast cancer Neg Hx   . Cancer - Colon Neg Hx   . Cervical cancer Neg Hx     Social History Social History   Tobacco Use  . Smoking status: Never Smoker  . Smokeless tobacco: Never Used  Substance Use Topics  . Alcohol use: No  . Drug use: No     Allergies   Shellfish allergy   Review of Systems Review of Systems  Constitutional: Positive for appetite change, chills and fatigue. Negative for activity change and fever.  HENT: Negative for congestion, ear pain, rhinorrhea, sinus pressure, sore throat and trouble swallowing.   Eyes: Negative for discharge and redness.  Respiratory: Negative for cough, chest tightness  and shortness of breath.   Cardiovascular: Negative for chest pain.  Gastrointestinal: Positive for nausea and vomiting. Negative for abdominal pain and diarrhea.  Musculoskeletal: Positive for myalgias.  Skin: Negative for rash.  Neurological: Negative for dizziness, light-headedness and headaches.     Physical Exam Triage Vital Signs ED Triage Vitals [07/02/18 1302]  Enc Vitals Group     BP (!) 147/84     Pulse Rate 100     Resp 18     Temp 99.7 F (37.6 C)     Temp Source Oral     SpO2 96 %     Weight      Height      Head Circumference      Peak Flow      Pain Score 0     Pain Loc      Pain Edu?      Excl. in GC?    No data found.  Updated Vital Signs BP (!) 147/84 (BP Location: Left Arm)   Pulse 100   Temp 99.7 F (37.6 C) (Oral)   Resp 18   SpO2 96%   Visual Acuity Right Eye Distance:   Left Eye Distance:    Bilateral Distance:    Right Eye Near:   Left Eye Near:    Bilateral Near:     Physical Exam Vitals signs and nursing note reviewed.  Constitutional:      General: She is not in acute distress.    Appearance: She is well-developed.  HENT:     Head: Normocephalic and atraumatic.     Ears:     Comments: Bilateral ears without tenderness to palpation of external auricle, tragus and mastoid, EAC's without erythema or swelling, TM's with good bony landmarks and cone of light. Non erythematous.     Nose:     Comments: Nasal mucosa pink without swollen turbinates    Mouth/Throat:     Comments: Oral mucosa pink and moist, no tonsillar enlargement or exudate. Posterior pharynx patent and nonerythematous, no uvula deviation or swelling. Normal phonation.  Eyes:     Conjunctiva/sclera: Conjunctivae normal.  Neck:     Musculoskeletal: Neck supple.  Cardiovascular:     Rate and Rhythm: Normal rate and regular rhythm.     Heart sounds: No murmur.  Pulmonary:     Effort: Pulmonary effort is normal. No respiratory distress.     Breath sounds: Normal breath sounds.     Comments: Breathing comfortably at rest, CTABL, no wheezing, rales or other adventitious sounds auscultated Abdominal:     Palpations: Abdomen is soft.     Tenderness: There is abdominal tenderness.     Comments: Generalized tenderness throughout abdomen, no focal tenderness, negative rebound, negative Rovsing, negative McBurney's, negative Murphy's  Skin:    General: Skin is warm and dry.  Neurological:     Mental Status: She is alert.      UC Treatments / Results  Labs (all labs ordered are listed, but only abnormal results are displayed) Labs Reviewed  POCT INFLUENZA A/B - Normal    EKG None  Radiology No results found.  Procedures Procedures (including critical care time)  Medications Ordered in UC Medications - No data to display  Initial Impression / Assessment and Plan / UC Course  I have reviewed  the triage vital signs and the nursing notes.  Pertinent labs & imaging results that were available during my care of the patient were reviewed by me and considered in my  medical decision making (see chart for details).     Patient with influenza versus viral gastroenteritis.  No focal tenderness on abdominal exam, negative peritoneal signs, do not suspect underlying abdominal emergency.  Will continue to monitor.  Flu test negative, still possible if patient starts to develop more URI symptoms over the next few days as she is early in the course of her presentation.  Will treat symptomatically and supportively.  Tylenol and ibuprofen for fevers.  Zofran as needed for nausea and vomiting.  Push fluids, bland diet.Discussed strict return precautions. Patient verbalized understanding and is agreeable with plan.  Final Clinical Impressions(s) / UC Diagnoses   Final diagnoses:  Influenza-like illness  Nausea and vomiting, intractability of vomiting not specified, unspecified vomiting type     Discharge Instructions     Symptoms are most likely viral, possible flu versus viral stomach bug Please alternate Tylenol and ibuprofen every 4 hours to help with fever, body aches and chills Please use Zofran as needed every 8 hours, dissolves underneath tongue, use for nausea and vomiting Push fluids, may get Pedialyte or Gatorade if not eating a lot of solids, slowly transition to bland diet-rice toast crackers soups potatoes, avoid spicy and greasy  If developing cold symptoms may use over-the-counter Zyrtec/Claritin, Flonase or Mucinex for congestion; Delsym or Robitussin for cough  Rest, drink plenty of fluids  Follow-up if symptoms not resolving, worsening, developing persistent fevers, inability to eat or drink despite use of Zofran   ED Prescriptions    Medication Sig Dispense Auth. Provider   ondansetron (ZOFRAN-ODT) 4 MG disintegrating tablet Take 1 tablet (4 mg total) by mouth every 8  (eight) hours as needed for nausea or vomiting. 20 tablet Margie Urbanowicz C, PA-C   ibuprofen (ADVIL,MOTRIN) 600 MG tablet Take 1 tablet (600 mg total) by mouth every 6 (six) hours as needed. 30 tablet Allyssia Skluzacek, PaceHallie C, PA-C     Controlled Substance Prescriptions [place] Controlled Substance Registry consulted? Not Applicable   Lew DawesWieters, Sereniti Wan C, PA-C 07/03/18 1129    Abner Ardis, [place]sbury[place]ie C, [place] JerseyPA-C 07/03/18 1130

## 2018-09-16 DIAGNOSIS — M9902 Segmental and somatic dysfunction of thoracic region: Secondary | ICD-10-CM | POA: Diagnosis not present

## 2018-09-16 DIAGNOSIS — M9903 Segmental and somatic dysfunction of lumbar region: Secondary | ICD-10-CM | POA: Diagnosis not present

## 2018-09-16 DIAGNOSIS — M5386 Other specified dorsopathies, lumbar region: Secondary | ICD-10-CM | POA: Diagnosis not present

## 2018-09-16 DIAGNOSIS — M9905 Segmental and somatic dysfunction of pelvic region: Secondary | ICD-10-CM | POA: Diagnosis not present

## 2018-09-21 DIAGNOSIS — M9902 Segmental and somatic dysfunction of thoracic region: Secondary | ICD-10-CM | POA: Diagnosis not present

## 2018-09-21 DIAGNOSIS — M9905 Segmental and somatic dysfunction of pelvic region: Secondary | ICD-10-CM | POA: Diagnosis not present

## 2018-09-21 DIAGNOSIS — M9903 Segmental and somatic dysfunction of lumbar region: Secondary | ICD-10-CM | POA: Diagnosis not present

## 2018-09-21 DIAGNOSIS — M5386 Other specified dorsopathies, lumbar region: Secondary | ICD-10-CM | POA: Diagnosis not present

## 2018-09-22 DIAGNOSIS — H6123 Impacted cerumen, bilateral: Secondary | ICD-10-CM | POA: Diagnosis not present

## 2018-09-22 DIAGNOSIS — H93299 Other abnormal auditory perceptions, unspecified ear: Secondary | ICD-10-CM | POA: Diagnosis not present

## 2018-09-23 DIAGNOSIS — L813 Cafe au lait spots: Secondary | ICD-10-CM | POA: Diagnosis not present

## 2018-09-23 DIAGNOSIS — D2261 Melanocytic nevi of right upper limb, including shoulder: Secondary | ICD-10-CM | POA: Diagnosis not present

## 2018-09-23 DIAGNOSIS — D485 Neoplasm of uncertain behavior of skin: Secondary | ICD-10-CM | POA: Diagnosis not present

## 2018-09-23 DIAGNOSIS — L814 Other melanin hyperpigmentation: Secondary | ICD-10-CM | POA: Diagnosis not present

## 2018-09-23 DIAGNOSIS — D225 Melanocytic nevi of trunk: Secondary | ICD-10-CM | POA: Diagnosis not present

## 2018-09-23 DIAGNOSIS — D2239 Melanocytic nevi of other parts of face: Secondary | ICD-10-CM | POA: Diagnosis not present

## 2018-09-28 DIAGNOSIS — M9902 Segmental and somatic dysfunction of thoracic region: Secondary | ICD-10-CM | POA: Diagnosis not present

## 2018-09-28 DIAGNOSIS — M5386 Other specified dorsopathies, lumbar region: Secondary | ICD-10-CM | POA: Diagnosis not present

## 2018-09-28 DIAGNOSIS — M9905 Segmental and somatic dysfunction of pelvic region: Secondary | ICD-10-CM | POA: Diagnosis not present

## 2018-09-28 DIAGNOSIS — M9903 Segmental and somatic dysfunction of lumbar region: Secondary | ICD-10-CM | POA: Diagnosis not present

## 2018-10-05 DIAGNOSIS — Z130 Encounter for screening for diseases of the blood and blood-forming organs and certain disorders involving the immune mechanism: Secondary | ICD-10-CM | POA: Diagnosis not present

## 2018-10-05 DIAGNOSIS — R32 Unspecified urinary incontinence: Secondary | ICD-10-CM | POA: Diagnosis not present

## 2018-10-05 DIAGNOSIS — Z30431 Encounter for routine checking of intrauterine contraceptive device: Secondary | ICD-10-CM | POA: Diagnosis not present

## 2018-10-05 DIAGNOSIS — Z01419 Encounter for gynecological examination (general) (routine) without abnormal findings: Secondary | ICD-10-CM | POA: Diagnosis not present

## 2018-10-05 DIAGNOSIS — Z6831 Body mass index (BMI) 31.0-31.9, adult: Secondary | ICD-10-CM | POA: Diagnosis not present

## 2018-10-05 DIAGNOSIS — Z113 Encounter for screening for infections with a predominantly sexual mode of transmission: Secondary | ICD-10-CM | POA: Diagnosis not present

## 2018-10-06 ENCOUNTER — Telehealth: Payer: Self-pay | Admitting: Physician Assistant

## 2018-10-06 NOTE — Telephone Encounter (Signed)
-----   Message from Neva Seat sent at 10/06/2018  1:28 PM EDT ----- Please review incoming fax.

## 2018-10-06 NOTE — Telephone Encounter (Signed)
Pap results requested from [place]. They are faxing them today.

## 2018-10-06 NOTE — Telephone Encounter (Signed)
Can we please request her last 2019 PAP from [facility] to update our records? Thanks.

## 2018-11-09 ENCOUNTER — Telehealth: Payer: Self-pay | Admitting: Physician Assistant

## 2018-11-09 NOTE — Telephone Encounter (Signed)
pt called saying she had covid testing last Monday in [facility] testing site.  She has not heard anything back from them and has left messages.  She wants to know what she needs to do as far as staying quarantined  CB#  [phone]  [facility]

## 2018-11-10 NOTE — Telephone Encounter (Signed)
Patient was advised via voicemail to self quarantine for 14 days and if  [facility] department give her different advice to follow what they say to her. FYI

## 2018-11-10 NOTE — Telephone Encounter (Signed)
Agreed, thanks

## 2019-02-21 ENCOUNTER — Encounter: Payer: Self-pay | Admitting: Physician Assistant

## 2019-02-21 DIAGNOSIS — J3089 Other allergic rhinitis: Secondary | ICD-10-CM

## 2019-02-21 DIAGNOSIS — Z91013 Allergy to seafood: Secondary | ICD-10-CM

## 2019-02-22 MED ORDER — EPINEPHRINE 0.3 MG/0.3ML IJ SOAJ: 0.3000 mg | INTRAMUSCULAR | 1 refills | Status: DC | PRN

## 2019-02-23 MED FILL — EPINEPHRINE 0.3 MG AUTO-INJ: 0.3 | 60 days supply | Qty: 4 | Fill #0

## 2019-03-03 MED ORDER — LORATADINE 10 MG PO TABS: 10.0000 mg | ORAL_TABLET | Freq: Every day | ORAL | 1 refills | Status: DC

## 2019-03-03 NOTE — Addendum Note (Signed)
Addended by: Trinna Post on: 03/03/2019 08:34 AM   Modules accepted: Orders

## 2019-04-26 ENCOUNTER — Encounter: Payer: Self-pay | Admitting: Physician Assistant

## 2019-04-26 DIAGNOSIS — H9209 Otalgia, unspecified ear: Secondary | ICD-10-CM

## 2019-06-01 ENCOUNTER — Ambulatory Visit: Payer: Self-pay | Admitting: *Deleted

## 2019-06-01 NOTE — Telephone Encounter (Signed)
Patient called c/o intermittent chest pain x 3 days with one episode of heart fluttering in her chest.  Patient completely denies any chest pain, heart fluttering, nausea, SOB, sweating.   Patient calling from work to hopefully get an appointment to get checked out in the office.  All Covid [employer] questions answered by patient.  Note:  Admits to positive COVID test 10/2018.   Appointment scheduled for February 11th at 8:20 a.m., which is the earliest patient is available.   Care Advice per protocol.  Patient verbalized understanding.     Reason for Disposition . [1] Chest pain lasting < 5 minutes AND [2] NO chest pain or cardiac symptoms (e.g., breathing difficulty, sweating) now  Answer Assessment - Initial Assessment Questions 1. LOCATION: "Where does it hurt?"       Mid sternum and above.  In between the breasts.   2. RADIATION: "Does the pain go anywhere else?" (e.g., into neck, jaw, arms, back)     no 3. ONSET: "When did the chest pain begin?" (Minutes, hours or days)      Started three days ago - intermittent 4. PATTERN "Does the pain come and go, or has it been constant since it started?"  "Does it get worse with exertion?"      Intermittent.  Patient was at work when the chest pain started 5. DURATION: "How long does it last" (e.g., seconds, minutes, hours)     Intermittent - couple of minutes each time.   6. SEVERITY: "How bad is the pain?"  (e.g., Scale 1-10; mild, moderate, or severe)    - MILD (1-3): doesn't interfere with normal activities     - MODERATE (4-7): interferes with normal activities or awakens from sleep    - SEVERE (8-10): excruciating pain, unable to do any normal activities       6 was the highest pain level 7. CARDIAC RISK FACTORS: "Do you have any history of heart problems or risk factors for heart disease?" (e.g., angina, prior heart attack; diabetes, high blood pressure, high cholesterol, smoker, or strong family history of heart disease)     No,  nonsmoker, Dad has high blood pressure.  Patient's BP always runs low 8. PULMONARY RISK FACTORS: "Do you have any history of lung disease?"  (e.g., blood clots in lung, asthma, emphysema, birth control pills)     No lung disease; BCPs - no  9. CAUSE: "What do you think is causing the chest pain?"     "I do not think it is anything serious."   10. OTHER SYMPTOMS: "Do you have any other symptoms?" (e.g., dizziness, nausea, vomiting, sweating, fever, difficulty breathing, cough)       NONE - palpitations - heart fluttering x 1 during this 3 day period.   11. PREGNANCY: "Is there any chance you are pregnant?" "When was your last menstrual period?"       No  Protocols used: CHEST PAIN-A-AH

## 2019-06-03 ENCOUNTER — Other Ambulatory Visit: Payer: Self-pay

## 2019-06-03 ENCOUNTER — Ambulatory Visit (INDEPENDENT_AMBULATORY_CARE_PROVIDER_SITE_OTHER): Payer: No Typology Code available for payment source | Admitting: Physician Assistant

## 2019-06-03 ENCOUNTER — Encounter: Payer: Self-pay | Admitting: Physician Assistant

## 2019-06-03 VITALS — BP 120/76 | HR 97 | Temp 97.1°F | Wt 162.4 lb

## 2019-06-03 DIAGNOSIS — R079 Chest pain, unspecified: Secondary | ICD-10-CM | POA: Diagnosis not present

## 2019-06-03 NOTE — Progress Notes (Signed)
Patient: Samantha Bailey Female    DOB: 04/08/1991   29 y.o.   MRN: [id number] Visit Date: 06/03/2019  Today's Provider: Trinna Post, PA-C   Chief Complaint  Patient presents with  . Chest Pain   Subjective:    I, Porsha McClurkin,CMA am acting as a Education administrator for [employer].  Chest Pain  This is a new problem. The current episode started in the past 7 days. The problem occurs rarely. The pain is at a severity of 0/10. The patient is experiencing no pain. Associated symptoms include irregular heartbeat and palpitations. Pertinent negatives include no dizziness, headaches, numbness, shortness of breath or weakness.   Had several instances of squeezing chest pain on Friday and yesterday for several seconds. This happens when she was just standing there. She does not have worsening pain with activity and it does not improve with rest. She denies SOB, nausea, vomiting, radiation of pain.   Wt Readings from Last 3 Encounters:  06/03/19 162 lb 6.4 oz (73.7 kg)  04/08/18 177 lb (80.3 kg)  02/03/17 169 lb (76.7 kg)      Allergies  Allergen Reactions  . Shellfish Allergy Swelling     Current Outpatient Medications:  .  EPINEPHrine 0.3 mg/0.3 mL IJ SOAJ injection, Inject 0.3 mLs (0.3 mg total) into the muscle as needed for up to 4 doses for anaphylaxis., Disp: 4 each, Rfl: 1 .  ibuprofen (ADVIL,MOTRIN) 600 MG tablet, Take 1 tablet (600 mg total) by mouth every 6 (six) hours as needed., Disp: 30 tablet, Rfl: 0 .  Levonorgestrel 13.5 MG IUD, 13.5 mg once by Intrauterine route. Skla Placed on 12/29/2015, Disp: , Rfl:  .  loratadine (CLARITIN) 10 MG tablet, Take 1 tablet (10 mg total) by mouth daily., Disp: 90 tablet, Rfl: 1 .  ondansetron (ZOFRAN-ODT) 4 MG disintegrating tablet, Take 1 tablet (4 mg total) by mouth every 8 (eight) hours as needed for nausea or vomiting., Disp: 20 tablet, Rfl: 0  Review of Systems  Constitutional: Negative.   Respiratory: Negative for  shortness of breath.   Cardiovascular: Positive for chest pain and palpitations.  Musculoskeletal: Negative.   Neurological: Negative.  Negative for dizziness, weakness, numbness and headaches.    Social History   Tobacco Use  . Smoking status: Never Smoker  . Smokeless tobacco: Never Used  Substance Use Topics  . Alcohol use: No      Objective:   BP 120/76 (BP Location: Right Arm, Patient Position: Sitting, Cuff Size: Normal)   Pulse 97   Temp (!) 97.1 F (36.2 C) (Temporal)   Wt 162 lb 6.4 oz (73.7 kg)   BMI 29.70 kg/m  Vitals:   06/03/19 0844  BP: 120/76  Pulse: 97  Temp: (!) 97.1 F (36.2 C)  TempSrc: Temporal  Weight: 162 lb 6.4 oz (73.7 kg)  Body mass index is 29.7 kg/m.   Physical Exam Constitutional:      Appearance: She is well-developed.  Cardiovascular:     Rate and Rhythm: Normal rate and regular rhythm.     Heart sounds: Normal heart sounds.  Pulmonary:     Effort: Pulmonary effort is normal.     Breath sounds: Normal breath sounds.  Skin:    General: Skin is warm and dry.  Neurological:     Mental Status: She is alert and oriented to person, place, and time.  Psychiatric:        Mood and Affect: Mood normal.  Behavior: Behavior normal.      No results found for any visits on 06/03/19.     Assessment & Plan    1. Chest pain, unspecified type  EKG with possible RBB but patient without history of CVD, arrhythmia and EKG is otherwise normal.   - EKG 12-Lead      Trey Sailors, PA-C  [facility]

## 2019-06-03 NOTE — Patient Instructions (Signed)
Palpitations Palpitations are feelings that your heartbeat is not normal. Your heartbeat may feel like it is:  Uneven.  Faster than normal.  Fluttering.  Skipping a beat. This is usually not a serious problem. In some cases, you may need tests to rule out any serious problems. Follow these instructions at home: Pay attention to any changes in your condition. Take these actions to help manage your symptoms: Eating and drinking  Avoid: ? Coffee, tea, soft drinks, and energy drinks. ? Chocolate. ? Alcohol. ? Diet pills. Lifestyle   Try to lower your stress. These things can help you relax: ? Yoga. ? Deep breathing and meditation. ? Exercise. ? Using words and images to create positive thoughts (guided imagery). ? Using your mind to control things in your body (biofeedback).  Do not use drugs.  Get plenty of rest and sleep. Keep a regular bed time. General instructions   Take over-the-counter and prescription medicines only as told by your doctor.  Do not use any products that contain nicotine or tobacco, such as cigarettes and e-cigarettes. If you need help quitting, ask your doctor.  Keep all follow-up visits as told by your doctor. This is important. You may need more tests if palpitations do not go away or get worse. Contact a doctor if:  Your symptoms last more than 24 hours.  Your symptoms occur more often. Get help right away if you:  Have chest pain.  Feel short of breath.  Have a very bad headache.  Feel dizzy.  Pass out (faint). Summary  Palpitations are feelings that your heartbeat is uneven or faster than normal. It may feel like your heart is fluttering or skipping a beat.  Avoid food and drinks that may cause palpitations. These include caffeine, chocolate, and alcohol.  Try to lower your stress. Do not smoke or use drugs.  Get help right away if you faint or have chest pain, shortness of breath, a severe headache, or dizziness. This  information is not intended to replace advice given to you by your health care provider. Make sure you discuss any questions you have with your health care provider. Document Revised: 05/21/2017 Document Reviewed: 05/21/2017 Elsevier Patient Education  2020 Elsevier Inc.  

## 2019-06-11 MED FILL — medroxyPROGESTERone ACETATE: 150 | 90 days supply | Qty: 1 | Fill #0

## 2019-06-20 ENCOUNTER — Inpatient Hospital Stay (HOSPITAL_COMMUNITY): Payer: No Typology Code available for payment source

## 2019-06-20 ENCOUNTER — Other Ambulatory Visit: Payer: Self-pay

## 2019-06-20 ENCOUNTER — Emergency Department (HOSPITAL_COMMUNITY): Payer: No Typology Code available for payment source

## 2019-06-20 ENCOUNTER — Encounter (HOSPITAL_COMMUNITY): Payer: Self-pay | Admitting: *Deleted

## 2019-06-20 ENCOUNTER — Inpatient Hospital Stay (HOSPITAL_COMMUNITY)
Admission: EM | Admit: 2019-06-20 | Discharge: 2019-06-23 | DRG: 957 | Disposition: A | Discharge status: Home / Self Care | Payer: No Typology Code available for payment source | Attending: General Surgery | Admitting: General Surgery

## 2019-06-20 DIAGNOSIS — F10129 Alcohol abuse with intoxication, unspecified: Secondary | ICD-10-CM | POA: Diagnosis present

## 2019-06-20 DIAGNOSIS — S32019A Unspecified fracture of first lumbar vertebra, initial encounter for closed fracture: Secondary | ICD-10-CM | POA: Diagnosis present

## 2019-06-20 DIAGNOSIS — Z7982 Long term (current) use of aspirin: Secondary | ICD-10-CM | POA: Diagnosis not present

## 2019-06-20 DIAGNOSIS — Z20822 Contact with and (suspected) exposure to covid-19: Secondary | ICD-10-CM | POA: Diagnosis present

## 2019-06-20 DIAGNOSIS — S270XXA Traumatic pneumothorax, initial encounter: Secondary | ICD-10-CM | POA: Diagnosis present

## 2019-06-20 DIAGNOSIS — S37812A Contusion of adrenal gland, initial encounter: Secondary | ICD-10-CM | POA: Diagnosis present

## 2019-06-20 DIAGNOSIS — Y906 Blood alcohol level of 120-199 mg/100 ml: Secondary | ICD-10-CM | POA: Diagnosis present

## 2019-06-20 DIAGNOSIS — S32029A Unspecified fracture of second lumbar vertebra, initial encounter for closed fracture: Secondary | ICD-10-CM | POA: Diagnosis present

## 2019-06-20 DIAGNOSIS — S42111A Displaced fracture of body of scapula, right shoulder, initial encounter for closed fracture: Secondary | ICD-10-CM | POA: Diagnosis present

## 2019-06-20 DIAGNOSIS — E2749 Other adrenocortical insufficiency: Secondary | ICD-10-CM | POA: Diagnosis present

## 2019-06-20 DIAGNOSIS — R58 Hemorrhage, not elsewhere classified: Secondary | ICD-10-CM

## 2019-06-20 DIAGNOSIS — I959 Hypotension, unspecified: Secondary | ICD-10-CM | POA: Diagnosis present

## 2019-06-20 DIAGNOSIS — S27321A Contusion of lung, unilateral, initial encounter: Secondary | ICD-10-CM | POA: Diagnosis present

## 2019-06-20 DIAGNOSIS — T402X5A Adverse effect of other opioids, initial encounter: Secondary | ICD-10-CM | POA: Diagnosis present

## 2019-06-20 DIAGNOSIS — S32039A Unspecified fracture of third lumbar vertebra, initial encounter for closed fracture: Secondary | ICD-10-CM | POA: Diagnosis present

## 2019-06-20 DIAGNOSIS — K661 Hemoperitoneum: Secondary | ICD-10-CM | POA: Diagnosis present

## 2019-06-20 DIAGNOSIS — S32009A Unspecified fracture of unspecified lumbar vertebra, initial encounter for closed fracture: Secondary | ICD-10-CM

## 2019-06-20 DIAGNOSIS — S36113A Laceration of liver, unspecified degree, initial encounter: Secondary | ICD-10-CM | POA: Diagnosis present

## 2019-06-20 DIAGNOSIS — S2242XA Multiple fractures of ribs, left side, initial encounter for closed fracture: Secondary | ICD-10-CM

## 2019-06-20 DIAGNOSIS — R110 Nausea: Secondary | ICD-10-CM | POA: Diagnosis present

## 2019-06-20 DIAGNOSIS — S2232XA Fracture of one rib, left side, initial encounter for closed fracture: Secondary | ICD-10-CM | POA: Diagnosis present

## 2019-06-20 DIAGNOSIS — S36116A Major laceration of liver, initial encounter: Principal | ICD-10-CM | POA: Diagnosis present

## 2019-06-20 DIAGNOSIS — D62 Acute posthemorrhagic anemia: Secondary | ICD-10-CM | POA: Diagnosis not present

## 2019-06-20 DIAGNOSIS — S32049A Unspecified fracture of fourth lumbar vertebra, initial encounter for closed fracture: Secondary | ICD-10-CM | POA: Diagnosis present

## 2019-06-20 DIAGNOSIS — Z91013 Allergy to seafood: Secondary | ICD-10-CM

## 2019-06-20 DIAGNOSIS — J939 Pneumothorax, unspecified: Secondary | ICD-10-CM

## 2019-06-20 HISTORY — PX: IR EMBO ART  VEN HEMORR LYMPH EXTRAV  INC GUIDE ROADMAPPING: IMG5450

## 2019-06-20 HISTORY — PX: IR US GUIDE VASC ACCESS RIGHT: IMG2390

## 2019-06-20 HISTORY — PX: IR ANGIOGRAM VISCERAL SELECTIVE: IMG657

## 2019-06-20 HISTORY — PX: IR ANGIOGRAM SELECTIVE EACH ADDITIONAL VESSEL: IMG667

## 2019-06-20 LAB — I-STAT CHEM 8, ED
BUN: 9 mg/dL (ref 6–20)
BUN: 7 mg/dL (ref 6–20)
Calcium, Ion: 1.12 mmol/L — ABNORMAL LOW (ref 1.15–1.40)
Calcium, Ion: 1.02 mmol/L — ABNORMAL LOW (ref 1.15–1.40)
Chloride: 105 mmol/L (ref 98–111)
Chloride: 111 mmol/L (ref 98–111)
Creatinine, Ser: 0.8 mg/dL (ref 0.44–1.00)
Creatinine, Ser: 0.6 mg/dL (ref 0.44–1.00)
Glucose, Bld: 164 mg/dL — ABNORMAL HIGH (ref 70–99)
Glucose, Bld: 135 mg/dL — ABNORMAL HIGH (ref 70–99)
HCT: 37 % (ref 36.0–46.0)
HCT: 27 % — ABNORMAL LOW (ref 36.0–46.0)
Hemoglobin: 12.6 g/dL (ref 12.0–15.0)
Hemoglobin: 9.2 g/dL — ABNORMAL LOW (ref 12.0–15.0)
Potassium: 2.6 mmol/L — CL (ref 3.5–5.1)
Potassium: 3.1 mmol/L — ABNORMAL LOW (ref 3.5–5.1)
Sodium: 143 mmol/L (ref 135–145)
Sodium: 143 mmol/L (ref 135–145)
TCO2: 22 mmol/L (ref 22–32)
TCO2: 18 mmol/L — ABNORMAL LOW (ref 22–32)

## 2019-06-20 LAB — COMPREHENSIVE METABOLIC PANEL
ALT: 633 U/L — ABNORMAL HIGH (ref 0–44)
AST: 608 U/L — ABNORMAL HIGH (ref 15–41)
Albumin: 3.8 g/dL (ref 3.5–5.0)
Alkaline Phosphatase: 41 U/L (ref 38–126)
Anion gap: 12 (ref 5–15)
BUN: 10 mg/dL (ref 6–20)
CO2: 20 mmol/L — ABNORMAL LOW (ref 22–32)
Calcium: 8.5 mg/dL — ABNORMAL LOW (ref 8.9–10.3)
Chloride: 109 mmol/L (ref 98–111)
Creatinine, Ser: 0.77 mg/dL (ref 0.44–1.00)
GFR calc Af Amer: >60 mL/min (ref >60)
GFR calc non Af Amer: >60 mL/min (ref >60)
Glucose, Bld: 173 mg/dL — ABNORMAL HIGH (ref 70–99)
Potassium: 2.8 mmol/L — ABNORMAL LOW (ref 3.5–5.1)
Sodium: 141 mmol/L (ref 135–145)
Total Bilirubin: 0.4 mg/dL (ref 0.3–1.2)
Total Protein: 6.7 g/dL (ref 6.5–8.1)

## 2019-06-20 LAB — CBC
HCT: 38.5 % (ref 36.0–46.0)
HCT: 40.8 % (ref 36.0–46.0)
Hemoglobin: 12.4 g/dL (ref 12.0–15.0)
Hemoglobin: 13.4 g/dL (ref 12.0–15.0)
MCH: 28.1 pg (ref 26.0–34.0)
MCH: 28.3 pg (ref 26.0–34.0)
MCHC: 32.2 g/dL (ref 30.0–36.0)
MCHC: 32.8 g/dL (ref 30.0–36.0)
MCV: 87.3 fL (ref 80.0–100.0)
MCV: 86.3 fL (ref 80.0–100.0)
Platelets: 449 10*3/uL — ABNORMAL HIGH (ref 150–400)
Platelets: 148 10*3/uL — ABNORMAL LOW (ref 150–400)
RBC: 4.41 MIL/uL (ref 3.87–5.11)
RBC: 4.73 MIL/uL (ref 3.87–5.11)
RDW: 12.2 % (ref 11.5–15.5)
RDW: 12.7 % (ref 11.5–15.5)
WBC: 15.2 10*3/uL — ABNORMAL HIGH (ref 4.0–10.5)
WBC: 14.1 10*3/uL — ABNORMAL HIGH (ref 4.0–10.5)
nRBC: 0 % (ref 0.0–0.2)
nRBC: 0 % (ref 0.0–0.2)

## 2019-06-20 LAB — PROTIME-INR
INR: 1.2 (ref 0.8–1.2)
Prothrombin Time: 15.4 seconds — ABNORMAL HIGH (ref 11.4–15.2)

## 2019-06-20 LAB — URINALYSIS, ROUTINE W REFLEX MICROSCOPIC
Bacteria, UA: NONE SEEN
Bilirubin Urine: NEGATIVE
Glucose, UA: NEGATIVE mg/dL
Ketones, ur: NEGATIVE mg/dL
Leukocytes,Ua: NEGATIVE
Nitrite: NEGATIVE
Protein, ur: NEGATIVE mg/dL
Specific Gravity, Urine: 1.038 — ABNORMAL HIGH (ref 1.005–1.030)
pH: 5 (ref 5.0–8.0)

## 2019-06-20 LAB — MRSA PCR SCREENING: MRSA by PCR: NEGATIVE

## 2019-06-20 LAB — RESPIRATORY PANEL BY RT PCR (FLU A&B, COVID)
Influenza A by PCR: NEGATIVE
Influenza B by PCR: NEGATIVE
SARS Coronavirus 2 by RT PCR: NEGATIVE

## 2019-06-20 LAB — ETHANOL: Alcohol, Ethyl (B): 134 mg/dL — ABNORMAL HIGH (ref <10)

## 2019-06-20 LAB — LACTIC ACID, PLASMA: Lactic Acid, Venous: 3.6 mmol/L (ref 0.5–1.9)

## 2019-06-20 LAB — SAMPLE TO BLOOD BANK

## 2019-06-20 LAB — PREPARE RBC (CROSSMATCH)

## 2019-06-20 LAB — I-STAT BETA HCG BLOOD, ED (MC, WL, AP ONLY): I-stat hCG, quantitative: <5 m[IU]/mL (ref <5)

## 2019-06-20 LAB — CDS SEROLOGY

## 2019-06-20 LAB — ABO/RH: ABO/RH(D): A POS

## 2019-06-20 LAB — HIV ANTIBODY (ROUTINE TESTING W REFLEX): HIV Screen 4th Generation wRfx: NONREACTIVE

## 2019-06-20 MED ORDER — KCL IN DEXTROSE-NACL 40-5-0.45 MEQ/L-%-% IV SOLN
INTRAVENOUS | Status: DC
  Filled 2019-06-20 (×4): qty 1000

## 2019-06-20 MED ORDER — FENTANYL CITRATE (PF) 100 MCG/2ML IJ SOLN
INTRAMUSCULAR | Status: AC
  Filled 2019-06-20: qty 2

## 2019-06-20 MED ORDER — GELATIN ABSORBABLE 12-7 MM EX MISC
CUTANEOUS | Status: AC
  Filled 2019-06-20: qty 2

## 2019-06-20 MED ORDER — ONDANSETRON HCL 4 MG/2ML IJ SOLN
INTRAMUSCULAR | Status: AC | PRN
  Administered 2019-06-20: 4 mg via INTRAVENOUS

## 2019-06-20 MED ORDER — SODIUM CHLORIDE 0.9 % IV BOLUS
1000.0000 mL | Freq: Once | INTRAVENOUS | Status: AC
  Administered 2019-06-20: 04:00:00 1000 mL via INTRAVENOUS

## 2019-06-20 MED ORDER — SODIUM CHLORIDE 0.9 % IV SOLN: INTRAVENOUS | Status: DC

## 2019-06-20 MED ORDER — IOHEXOL 300 MG/ML  SOLN: 150.0000 mL | Freq: Once | INTRAMUSCULAR | Status: DC | PRN

## 2019-06-20 MED ORDER — MIDAZOLAM HCL 2 MG/2ML IJ SOLN
INTRAMUSCULAR | Status: AC
  Filled 2019-06-20: qty 6

## 2019-06-20 MED ORDER — FENTANYL CITRATE (PF) 100 MCG/2ML IJ SOLN
25.0000 ug | INTRAMUSCULAR | Status: DC | PRN
  Administered 2019-06-20 – 2019-06-21 (×3): 50 ug via INTRAVENOUS
  Administered 2019-06-21 – 2019-06-22 (×3): 25 ug via INTRAVENOUS
  Filled 2019-06-20 (×6): qty 2

## 2019-06-20 MED ORDER — FENTANYL CITRATE (PF) 100 MCG/2ML IJ SOLN
INTRAMUSCULAR | Status: AC | PRN
  Administered 2019-06-20: 50 ug via INTRAVENOUS

## 2019-06-20 MED ORDER — ONDANSETRON HCL 4 MG/2ML IJ SOLN
INTRAMUSCULAR | Status: AC
  Filled 2019-06-20: qty 2

## 2019-06-20 MED ORDER — PANTOPRAZOLE SODIUM 40 MG IV SOLR
40.0000 mg | Freq: Every day | INTRAVENOUS | Status: DC
  Administered 2019-06-20: 12:00:00 40 mg via INTRAVENOUS
  Filled 2019-06-20: qty 40

## 2019-06-20 MED ORDER — SODIUM CHLORIDE 0.9% IV SOLUTION: Freq: Once | INTRAVENOUS | Status: DC

## 2019-06-20 MED ORDER — IOHEXOL 300 MG/ML  SOLN
150.0000 mL | Freq: Once | INTRAMUSCULAR | Status: AC | PRN
  Administered 2019-06-20: 09:00:00 75 mL via INTRA_ARTERIAL

## 2019-06-20 MED ORDER — LIDOCAINE HCL 1 % IJ SOLN
INTRAMUSCULAR | Status: AC
  Filled 2019-06-20: qty 20

## 2019-06-20 MED ORDER — IOHEXOL 300 MG/ML  SOLN
100.0000 mL | Freq: Once | INTRAMUSCULAR | Status: AC | PRN
  Administered 2019-06-20: 100 mL via INTRAVENOUS

## 2019-06-20 MED ORDER — FENTANYL CITRATE (PF) 100 MCG/2ML IJ SOLN
INTRAMUSCULAR | Status: AC | PRN
  Administered 2019-06-20 (×2): 25 ug via INTRAVENOUS

## 2019-06-20 MED ORDER — FENTANYL CITRATE (PF) 100 MCG/2ML IJ SOLN
75.0000 ug | Freq: Once | INTRAMUSCULAR | Status: AC
  Administered 2019-06-20: 06:00:00 75 ug via INTRAVENOUS
  Filled 2019-06-20: qty 2

## 2019-06-20 MED ORDER — POTASSIUM CHLORIDE 10 MEQ/100ML IV SOLN
10.0000 meq | Freq: Once | INTRAVENOUS | Status: AC
  Administered 2019-06-20: 07:00:00 10 meq via INTRAVENOUS
  Filled 2019-06-20: qty 100

## 2019-06-20 MED ORDER — MIDAZOLAM HCL 2 MG/2ML IJ SOLN
INTRAMUSCULAR | Status: AC | PRN
  Administered 2019-06-20 (×2): 0.5 mg via INTRAVENOUS
  Administered 2019-06-20: 1 mg via INTRAVENOUS

## 2019-06-20 MED ORDER — ONDANSETRON HCL 4 MG/2ML IJ SOLN
4.0000 mg | Freq: Four times a day (QID) | INTRAMUSCULAR | Status: DC | PRN
  Administered 2019-06-20 – 2019-06-22 (×5): 4 mg via INTRAVENOUS
  Filled 2019-06-20 (×5): qty 2

## 2019-06-20 MED ORDER — SODIUM CHLORIDE 0.9 % IV BOLUS
1000.0000 mL | Freq: Once | INTRAVENOUS | Status: AC
  Administered 2019-06-20: 06:00:00 1000 mL via INTRAVENOUS

## 2019-06-20 MED ORDER — PANTOPRAZOLE SODIUM 40 MG PO TBEC
40.0000 mg | DELAYED_RELEASE_TABLET | Freq: Every day | ORAL | Status: DC
  Administered 2019-06-21 – 2019-06-23 (×3): 40 mg via ORAL
  Filled 2019-06-20 (×3): qty 1

## 2019-06-20 MED ORDER — CHLORHEXIDINE GLUCONATE CLOTH 2 % EX PADS
6.0000 | MEDICATED_PAD | Freq: Every day | CUTANEOUS | Status: DC
Start: 2019-06-20 — End: 2019-06-23
  Administered 2019-06-20: 17:00:00 6 via TOPICAL

## 2019-06-20 MED ORDER — ONDANSETRON HCL 4 MG/2ML IJ SOLN
4.0000 mg | Freq: Once | INTRAMUSCULAR | Status: AC
  Administered 2019-06-20: 06:00:00 4 mg via INTRAVENOUS

## 2019-06-20 MED ORDER — HYDROMORPHONE HCL 1 MG/ML IJ SOLN
1.0000 mg | INTRAMUSCULAR | Status: DC | PRN
  Administered 2019-06-20 (×3): 1 mg via INTRAVENOUS
  Filled 2019-06-20 (×3): qty 1

## 2019-06-20 MED ORDER — MIDAZOLAM HCL 2 MG/2ML IJ SOLN
INTRAMUSCULAR | Status: AC | PRN
  Administered 2019-06-20: 1 mg via INTRAVENOUS

## 2019-06-20 MED ORDER — ONDANSETRON 4 MG PO TBDP
4.0000 mg | ORAL_TABLET | Freq: Four times a day (QID) | ORAL | Status: DC | PRN
  Administered 2019-06-21 – 2019-06-22 (×2): 4 mg via ORAL
  Filled 2019-06-20 (×2): qty 1

## 2019-06-20 NOTE — ED Notes (Signed)
Please call husband Para March at [phone] with update

## 2019-06-20 NOTE — ED Notes (Signed)
Pt updated her husband Para March via telephone.

## 2019-06-20 NOTE — ED Triage Notes (Signed)
[place] gems [place]  atv came to rest on the pts aBD AND CHEST  SHE ARRIVED ALERT AND ORIENTED  SKIN WARM AND DRY  C/O ABD AND LOWER SPINE PAIN  LMP LAST MONTH  NO IV ONJE PLACED ON ARRIVAL

## 2019-06-20 NOTE — ED Notes (Signed)
Paged (IR) Dr Loreta Ave to Dr Ascencion Dike

## 2019-06-20 NOTE — ED Notes (Signed)
PT RIDING ASTX AT 0230 WHEN THE ACCIDENT OCCURRED  NO HELMET WORN

## 2019-06-20 NOTE — ED Notes (Signed)
Dr Harlon Flor at bedside

## 2019-06-20 NOTE — Procedures (Signed)
Interventional Radiology Procedure Note  Procedure:  US guided right CFA access for mesenteric angiogram. Coil embolization of 3 caudate lobe branches, with the 2 branches from right Hepatic artery corresponding to the findings on the CT scan.   Exoseal for hemostasis.  .  Complications: None  Recommendations:  - right hip straight x 4 hours - routine wound care - Do not submerge for 7 days - VIR to follow   Signed,  Yvone Neu. Loreta Ave, DO
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
## 2019-06-20 NOTE — Sedation Documentation (Signed)
5Fr sheath removed from RIGHT femoral artery by Dr. Loreta Ave. Hemostasis achieved using Exoseal closure device. Groin level 0, RDP D+, RPT D+.

## 2019-06-20 NOTE — ED Notes (Signed)
Pt speaking with husband on the telephone at this time
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
## 2019-06-20 NOTE — ED Notes (Signed)
Pts husband Para March [phone]

## 2019-06-20 NOTE — ED Notes (Signed)
PORTABLE XRAYS  OM ARRIVAL

## 2019-06-20 NOTE — ED Notes (Signed)
To ct
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
## 2019-06-20 NOTE — ED Notes (Signed)
Fentanyl 50 mcg iv per aujtumn rn

## 2019-06-21 ENCOUNTER — Inpatient Hospital Stay (HOSPITAL_COMMUNITY): Payer: No Typology Code available for payment source

## 2019-06-21 LAB — TYPE AND SCREEN
ABO/RH(D): A POS
Antibody Screen: NEGATIVE
Unit division: 0
Unit division: 0

## 2019-06-21 LAB — BPAM RBC
Blood Product Expiration Date: 202103232359
Blood Product Expiration Date: 202103242359
ISSUE DATE / TIME: 202102280726
ISSUE DATE / TIME: 202102281015
Unit Type and Rh: 6200
Unit Type and Rh: 6200

## 2019-06-21 LAB — CBC
HCT: 34.4 % — ABNORMAL LOW (ref 36.0–46.0)
HCT: 34.9 % — ABNORMAL LOW (ref 36.0–46.0)
Hemoglobin: 11.4 g/dL — ABNORMAL LOW (ref 12.0–15.0)
Hemoglobin: 11.5 g/dL — ABNORMAL LOW (ref 12.0–15.0)
MCH: 28.2 pg (ref 26.0–34.0)
MCH: 28.6 pg (ref 26.0–34.0)
MCHC: 33.1 g/dL (ref 30.0–36.0)
MCHC: 33 g/dL (ref 30.0–36.0)
MCV: 85.1 fL (ref 80.0–100.0)
MCV: 86.8 fL (ref 80.0–100.0)
Platelets: 232 10*3/uL (ref 150–400)
Platelets: 237 10*3/uL (ref 150–400)
RBC: 4.04 MIL/uL (ref 3.87–5.11)
RBC: 4.02 MIL/uL (ref 3.87–5.11)
RDW: 13.2 % (ref 11.5–15.5)
RDW: 13.2 % (ref 11.5–15.5)
WBC: 11.5 10*3/uL — ABNORMAL HIGH (ref 4.0–10.5)
WBC: 10.7 10*3/uL — ABNORMAL HIGH (ref 4.0–10.5)
nRBC: 0 % (ref 0.0–0.2)
nRBC: 0 % (ref 0.0–0.2)

## 2019-06-21 LAB — COMPREHENSIVE METABOLIC PANEL
ALT: 527 U/L — ABNORMAL HIGH (ref 0–44)
AST: 262 U/L — ABNORMAL HIGH (ref 15–41)
Albumin: 3.2 g/dL — ABNORMAL LOW (ref 3.5–5.0)
Alkaline Phosphatase: 43 U/L (ref 38–126)
Anion gap: 9 (ref 5–15)
BUN: <5 mg/dL — ABNORMAL LOW (ref 6–20)
CO2: 22 mmol/L (ref 22–32)
Calcium: 8.1 mg/dL — ABNORMAL LOW (ref 8.9–10.3)
Chloride: 107 mmol/L (ref 98–111)
Creatinine, Ser: 0.54 mg/dL (ref 0.44–1.00)
GFR calc Af Amer: >60 mL/min (ref >60)
GFR calc non Af Amer: >60 mL/min (ref >60)
Glucose, Bld: 98 mg/dL (ref 70–99)
Potassium: 3.7 mmol/L (ref 3.5–5.1)
Sodium: 138 mmol/L (ref 135–145)
Total Bilirubin: 1 mg/dL (ref 0.3–1.2)
Total Protein: 5.8 g/dL — ABNORMAL LOW (ref 6.5–8.1)

## 2019-06-21 LAB — PROTIME-INR
INR: 1.1 (ref 0.8–1.2)
Prothrombin Time: 14.2 seconds (ref 11.4–15.2)

## 2019-06-21 MED ORDER — OXYCODONE HCL 5 MG PO TABS
5.0000 mg | ORAL_TABLET | ORAL | Status: DC | PRN
  Administered 2019-06-21: 5 mg via ORAL
  Administered 2019-06-21 – 2019-06-22 (×3): 10 mg via ORAL
  Administered 2019-06-22: 5 mg via ORAL
  Administered 2019-06-22 – 2019-06-23 (×3): 10 mg via ORAL
  Administered 2019-06-23: 09:00:00 5 mg via ORAL
  Filled 2019-06-21: qty 1
  Filled 2019-06-21 (×8): qty 2

## 2019-06-21 MED ORDER — ACETAMINOPHEN 325 MG PO TABS
650.0000 mg | ORAL_TABLET | Freq: Four times a day (QID) | ORAL | Status: DC | PRN
  Administered 2019-06-21: 650 mg via ORAL
  Filled 2019-06-21: qty 2

## 2019-06-21 MED ORDER — METHOCARBAMOL 1000 MG/10ML IJ SOLN
1000.0000 mg | Freq: Three times a day (TID) | INTRAVENOUS | Status: DC | PRN
  Administered 2019-06-21: 1000 mg via INTRAVENOUS
  Filled 2019-06-21 (×2): qty 10
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
## 2019-06-22 LAB — BASIC METABOLIC PANEL
Anion gap: 8 (ref 5–15)
BUN: <5 mg/dL — ABNORMAL LOW (ref 6–20)
CO2: 25 mmol/L (ref 22–32)
Calcium: 8.6 mg/dL — ABNORMAL LOW (ref 8.9–10.3)
Chloride: 104 mmol/L (ref 98–111)
Creatinine, Ser: 0.71 mg/dL (ref 0.44–1.00)
GFR calc Af Amer: >60 mL/min (ref >60)
GFR calc non Af Amer: >60 mL/min (ref >60)
Glucose, Bld: 87 mg/dL (ref 70–99)
Potassium: 3.7 mmol/L (ref 3.5–5.1)
Sodium: 137 mmol/L (ref 135–145)

## 2019-06-22 LAB — CBC
HCT: 33.2 % — ABNORMAL LOW (ref 36.0–46.0)
Hemoglobin: 10.8 g/dL — ABNORMAL LOW (ref 12.0–15.0)
MCH: 28.3 pg (ref 26.0–34.0)
MCHC: 32.5 g/dL (ref 30.0–36.0)
MCV: 87.1 fL (ref 80.0–100.0)
Platelets: 224 10*3/uL (ref 150–400)
RBC: 3.81 MIL/uL — ABNORMAL LOW (ref 3.87–5.11)
RDW: 12.9 % (ref 11.5–15.5)
WBC: 7.4 10*3/uL (ref 4.0–10.5)
nRBC: 0 % (ref 0.0–0.2)

## 2019-06-22 MED ORDER — MORPHINE SULFATE (PF) 2 MG/ML IV SOLN: 2.0000 mg | INTRAVENOUS | Status: DC | PRN

## 2019-06-22 MED ORDER — ACETAMINOPHEN 325 MG PO TABS
650.0000 mg | ORAL_TABLET | Freq: Four times a day (QID) | ORAL | Status: DC
  Administered 2019-06-22 – 2019-06-23 (×4): 650 mg via ORAL
  Filled 2019-06-22 (×4): qty 2

## 2019-06-22 MED ORDER — POLYETHYLENE GLYCOL 3350 17 G PO PACK
17.0000 g | PACK | Freq: Every day | ORAL | Status: DC | PRN
  Administered 2019-06-22 – 2019-06-23 (×2): 17 g via ORAL
  Filled 2019-06-22 (×2): qty 1

## 2019-06-22 MED ORDER — METHOCARBAMOL 750 MG PO TABS
750.0000 mg | ORAL_TABLET | Freq: Four times a day (QID) | ORAL | Status: DC
  Administered 2019-06-22 – 2019-06-23 (×5): 750 mg via ORAL
  Filled 2019-06-22 (×5): qty 1

## 2019-06-22 MED ORDER — DOCUSATE SODIUM 100 MG PO CAPS
100.0000 mg | ORAL_CAPSULE | Freq: Two times a day (BID) | ORAL | Status: DC
  Administered 2019-06-22 – 2019-06-23 (×3): 100 mg via ORAL
  Filled 2019-06-22 (×3): qty 1
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
## 2019-06-22 NOTE — Plan of Care (Signed)
  Problem: Education: Goal: Knowledge of General Education information will improve Outcome: Progressing   Problem: Clinical Measurements: Goal: Respiratory complications will improve Outcome: Progressing   Problem: Nutrition: Goal: Adequate nutrition will be maintained Outcome: Progressing   Problem: Pain Managment: Goal: General experience of comfort will improve Outcome: Progressing   Problem: Skin Integrity: Goal: Risk for impaired skin integrity will decrease Outcome: Progressing
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
## 2019-06-23 LAB — CBC
HCT: 34.6 % — ABNORMAL LOW (ref 36.0–46.0)
Hemoglobin: 11.2 g/dL — ABNORMAL LOW (ref 12.0–15.0)
MCH: 28.2 pg (ref 26.0–34.0)
MCHC: 32.4 g/dL (ref 30.0–36.0)
MCV: 87.2 fL (ref 80.0–100.0)
Platelets: 280 10*3/uL (ref 150–400)
RBC: 3.97 MIL/uL (ref 3.87–5.11)
RDW: 12.6 % (ref 11.5–15.5)
WBC: 8.7 10*3/uL (ref 4.0–10.5)
nRBC: 0 % (ref 0.0–0.2)

## 2019-06-23 MED ORDER — METHOCARBAMOL 750 MG PO TABS: 750.0000 mg | ORAL_TABLET | Freq: Four times a day (QID) | ORAL | 0 refills | Status: DC | PRN

## 2019-06-23 MED ORDER — ACETAMINOPHEN 325 MG PO TABS: 650.0000 mg | ORAL_TABLET | Freq: Four times a day (QID) | ORAL | Status: DC | PRN

## 2019-06-23 MED ORDER — OXYCODONE HCL 5 MG PO TABS: 5.0000 mg | ORAL_TABLET | Freq: Four times a day (QID) | ORAL | 0 refills | Status: DC | PRN

## 2019-06-23 MED ORDER — ONDANSETRON 4 MG PO TBDP: 4.0000 mg | ORAL_TABLET | Freq: Four times a day (QID) | ORAL | 0 refills | Status: DC | PRN

## 2019-06-23 MED ORDER — DOCUSATE SODIUM 100 MG PO CAPS: 100.0000 mg | ORAL_CAPSULE | Freq: Two times a day (BID) | ORAL | 0 refills | Status: DC | PRN

## 2019-06-23 MED FILL — METHOCARBAMOL 750 MG TABS: 750 | 11 days supply | Qty: 45 | Fill #0

## 2019-06-23 MED FILL — ONDANSETRON ODT 4 MG TABLET: 4 | 5 days supply | Qty: 20 | Fill #0

## 2019-06-23 MED FILL — oxyCODONE HCL 5 MG TABS: 5 | 4 days supply | Qty: 15 | Fill #0
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
## 2019-06-23 NOTE — Progress Notes (Signed)
DC instructions given to pt and friend who is providing ride home. They both verbally acknowledged understanding of instructions. PT taken to pov via wc. Mayford Knife RN

## 2019-06-23 NOTE — Discharge Instructions (Signed)
RIB FRACTURES  HOME INSTRUCTIONS   1. PAIN CONTROL:  1. Pain is best controlled by a usual combination of three different methods TOGETHER:  i. Ice/Heat ii. Over the counter pain medication iii. Prescription pain medication 2. You may experience some swelling and bruising in area of broken ribs. Ice packs or heating pads (30-60 minutes up to 6 times a day) will help. Use ice for the first few days to help decrease swelling and bruising, then switch to heat to help relax tight/sore spots and speed recovery. Some people prefer to use ice alone, heat alone, alternating between ice & heat. Experiment to what works for you. Swelling and bruising can take several weeks to resolve.  3. It is helpful to take an over-the-counter pain medication regularly for the first few weeks. Choose one of the following that works best for you:  i. Naproxen (Aleve, etc) Two 220mg  tabs twice a day ii. Ibuprofen (Advil, etc) Three 200mg  tabs four times a day (every meal & bedtime) iii. Acetaminophen (Tylenol, etc) 500-650mg  four times a day (every meal & bedtime) 4. A prescription for pain medication (such as oxycodone, hydrocodone, etc) may be given to you upon discharge. Take your pain medication as prescribed.  i. If you are having problems/concerns with the prescription medicine (does not control pain, nausea, vomiting, rash, itching, etc), please call us [phone] to see if we need to switch you to a different pain medicine that will work better for you and/or control your side effect better. ii. If you need a refill on your pain medication, please contact your pharmacy. They will contact our office to request authorization. Prescriptions will not be filled after 5 pm or on week-ends. 1. Avoid getting constipated. When taking pain medications, it is common to experience some constipation. Increasing fluid intake and taking a fiber supplement (such as Metamucil, Citrucel, FiberCon, MiraLax, etc) 1-2 times a day  regularly will usually help prevent this problem from occurring. A mild laxative (prune juice, Milk of Magnesia, MiraLax, etc) should be taken according to package directions if there are no bowel movements after 48 hours.  2. Watch out for diarrhea. If you have many loose bowel movements, simplify your diet to bland foods & liquids for a few days. Stop any stool softeners and decrease your fiber supplement. Switching to mild anti-diarrheal medications (Kayopectate, Pepto Bismol) can help. If this worsens or does not improve, please call us. 3. FOLLOW UP  a. If a follow up appointment is needed one will be scheduled for you. If none is needed with our trauma team, please follow up with your primary care provider within 2-3 weeks from discharge. Please call [facility] at [phone] if you have any questions about follow up.  b. If you have any orthopedic or other injuries you will need to follow up as outlined in your follow up instructions.   WHEN TO CALL US [phone]:  1. Poor pain control 2. Reactions / problems with new medications (rash/itching, nausea, etc)  3. Fever over 101.5 F (38.5 C) 4. Worsening swelling or bruising 5. Worsening pain, productive cough, difficulty breathing or any other concerning symptoms  The clinic staff is available to answer your questions during regular business hours (8:30am-5pm). Please don't hesitate to call and ask to speak to one of our nurses for clinical concerns.  If you have a medical emergency, go to the nearest emergency room or call 911.  A surgeon from [facility] is always on  call at the [facility]   [facility] [place]  [address], [address], [place], [place] [address] ?  MAIN: ([phone] ? TOLL FREE: [phone] ?  FAX [phone]) [phone]  [email or web address]      Information on Rib Fractures  A rib fracture is a break or crack in one of the bones of the ribs. The ribs are long, curved bones that  wrap around your chest and attach to your spine and your breastbone. The ribs protect your heart, lungs, and other organs in the chest. A broken or cracked rib is often painful but is not usually serious. Most rib fractures heal on their own over time. However, rib fractures can be more serious if multiple ribs are broken or if broken ribs move out of place and push against other structures or organs. What are the causes? This condition is caused by:  Repetitive movements with high force, such as pitching a baseball or having severe coughing spells.  A direct blow to the chest, such as a sports injury, a car accident, or a fall.  Cancer that has spread to the bones, which can weaken bones and cause them to break. What are the signs or symptoms? Symptoms of this condition include:  Pain when you breathe in or cough.  Pain when someone presses on the injured area.  Feeling short of breath. How is this diagnosed? This condition is diagnosed with a physical exam and medical history. Imaging tests may also be done, such as:  Chest X-ray.  CT scan.  MRI.  Bone scan.  Chest ultrasound. How is this treated? Treatment for this condition depends on the severity of the fracture. Most rib fractures usually heal on their own in 1-3 months. Sometimes healing takes longer if there is a cough that does not stop or if there are other activities that make the injury worse (aggravating factors). While you heal, you will be given medicines to control the pain. You will also be taught deep breathing exercises. Severe injuries may require hospitalization or surgery. Follow these instructions at home: Managing pain, stiffness, and swelling  If directed, apply ice to the injured area. ? Put ice in a plastic bag. ? Place a towel between your skin and the bag. ? Leave the ice on for 20 minutes, 2-3 times a day.  Take over-the-counter and prescription medicines only as told by your health care  provider. Activity  Avoid a lot of activity and any activities or movements that cause pain. Be careful during activities and avoid bumping the injured rib.  Slowly increase your activity as told by your health care provider. General instructions  Do deep breathing exercises as told by your health care provider. This helps prevent pneumonia, which is a common complication of a broken rib. Your health care provider may instruct you to: ? Take deep breaths several times a day. ? Try to cough several times a day, holding a pillow against the injured area. ? Use a device called incentive spirometer to practice deep breathing several times a day.  Drink enough fluid to keep your urine pale yellow.  Do not wear a rib belt or binder. These restrict breathing, which can lead to pneumonia.  Keep all follow-up visits as told by your health care provider. This is important. Contact a health care provider if:  You have a fever. Get help right away if:  You have difficulty breathing or you are short of breath.  You develop a cough that  does not stop, or you cough up thick or bloody sputum.  You have nausea, vomiting, or pain in your abdomen.  Your pain gets worse and medicine does not help. Summary  A rib fracture is a break or crack in one of the bones of the ribs.  A broken or cracked rib is often painful but is not usually serious.  Most rib fractures heal on their own over time.  Treatment for this condition depends on the severity of the fracture.  Avoid a lot of activity and any activities or movements that cause pain. This information is not intended to replace advice given to you by your health care provider. Make sure you discuss any questions you have with your health care provider. Document Released: 04/08/2005 Document Revised: 07/08/2016 Document Reviewed: 07/08/2016 Elsevier Interactive Patient Education  2019 [employer].  Liver Laceration Please do not lift anything  greater than 10 pounds for the next 6 weeks.  Please avoid any activity that can cause trauma to your abdomen (i.e. riding a bike, contact sports, riding a horse etc.) A liver laceration is a tear or a cut in the liver. The liver is an organ that is involved in many important bodily functions. Sometimes, a liver laceration can be a very serious injury. It can cause a lot of bleeding, and surgery may be needed. Other times, a liver laceration may be minor, and bed rest may be all that is needed. Either way, treatment in a hospital is almost always required. Liver lacerations are categorized in grades from 1 to 5. Low numbers identify lacerations that are less severe than lacerations with high numbers.  Grade 1: This is a tear in the outer lining of the liver. It is less than  inch (1 cm) deep.  Grade 2: This is a tear that is about  inch to 1 inch (1 to 3 cm) deep. It is less than 4 inches (10 cm) long.  Grade 3: This is a tear that is slightly more than 1 inch (3 cm) deep.  Grades 4 and 5: These lacerations are very deep. They affect a large part of the liver. What are the causes? This condition may be caused by:  A forceful hit to the area around the liver (blunt trauma), such as in a car crash. Blunt trauma can tear the liver even though it does not break the skin.  An injury in which an object goes through the skin and into the liver (penetrating injury), such as a stab or gunshot wound. What are the signs or symptoms? Common symptoms of this condition include:  A swollen and firm abdomen.  Pain in the abdomen.  Tenderness when pressing on the right side of the abdomen. Other symptoms include:  Bleeding from a penetrating wound.  Bruises on the abdomen.  A fast heartbeat.  Taking quick breaths.  Feeling weak and dizzy. How is this diagnosed? To diagnose this condition, your health care provider will do a physical exam and ask about any injuries to the right side of your  abdomen. You may have various tests, such as:  Blood tests. Your blood may be tested every few hours. This will show whether you are losing blood.  CT scan. This test is done to check for laceration or bleeding.  Laparoscopy. This involves placing a small camera into the abdomen and looking directly at the surface of the liver. How is this treated? Treatment depends on how deep the laceration is and how much  bleeding you have. Treatment options include:  Monitoring and bed rest at the hospital. You will have tests often.  Receiving donated blood through an IV tube (transfusion) to replace blood that you have lost. You may need several transfusions.  Surgery to pack gauze pads or special material around the laceration to help it heal or to repair the laceration. Follow these instructions at home:  Take over-the-counter and prescription medicines only as told by your health care provider. Do not take any other medicines unless you ask your health care provider about them first.  Do not drive or use heavy machinery while taking prescription pain medicines.  Rest and limit your activity as told by your health care provider. It may be several months before you can return to your usual routine. Do not participate in activities that involve physical contact or require extra energy until your health care provider approves.  Keep all follow-up visits as told by your health care provider. This is important. Contact a health care provider if:  Your abdominal pain does not go away.  You feel more weak and tired than usual. Get help right away if:  Your abdominal pain gets worse.  You have a cut on your skin that: ? Has more redness, swelling, or pain around it. ? Has more fluid or blood coming from it. ? Feels warm to the touch. ? Has pus or a bad smell coming from it.  You feel dizzy or very weak.  You have trouble breathing.  You have a fever. This information is not intended to  replace advice given to you by your health care provider. Make sure you discuss any questions you have with your health care provider. Document Revised: 03/21/2017 Document Reviewed: 11/24/2015 Elsevier Patient Education  2020 Elsevier Inc.   Scapular Fracture Per Ortho - You can wear the sling for comfort only.  You may range the right shoulder as tolerated and may lift as tolerated with the right arm.  You are okay to weight-bear through the right arm  A scapular fracture is a break in the large, triangular bone behind your shoulder (shoulder blade or scapula). This bone makes up the socket joint of your shoulder. The scapula is well protected by muscles, so scapular fractures are unusual injuries. They often involve a lot of force. People who have a scapular fracture often have other injuries as well. These may be injuries to the lung, spine, head, shoulder, or ribs. What are the causes? Common causes of this condition include:  A fall from a great height.  A car or motorcycle accident.  A heavy, direct blow to the scapula. What are the signs or symptoms? The main symptom of a scapular fracture is severe pain when you try to move your arm. Other signs and symptoms include:  Swelling behind the shoulder.  Bruising.  Holding the arm still and close to the body. How is this diagnosed? This condition may be diagnosed based on:  Your symptoms and the details of a recent injury.  A physical exam.  X-ray or CT scan to confirm the diagnosis and to check for other injuries. How is this treated? This condition may be treated with:  Immobilization. Your arm is put in a sling. A support bandage may be wrapped around your chest. The health care provider will explain how to move your shoulder for the first week after your injury in order to prevent pain and stiffness. The sling can be removed as your movement increases  and your pain decreases.  Physical therapy. A physical therapist will  teach you exercises to stretch and strengthen your shoulder. The goal is to keep your shoulder from getting stiff or frozen. You may need to do these exercises for 6-12 months.  Surgery. You may need surgery if the bone pieces are out of place (displaced fracture). You may also need surgery if the fracture causes the bone to be deformed. In this case, the broken scapula will be put back into position and held in place with a surgical plate and screws. Surgery is rarely done for this condition. Follow these instructions at home:  Medicines  Take over-the-counter and prescription medicines only as told by your health care provider.  Do not drive or use heavy machinery while taking prescription pain medicine. If you have a splint and a wrap:  Wear the splint and the wrap as told by your health care provider. Remove them only as told by your health care provider.  Loosen them if your fingers or toes tingle, become numb, or turn cold and blue.  Keep them clean.  If they are not waterproof: ? Do not let them get wet. ? Cover them with a watertight covering when you take a bath or a shower. Managing pain, stiffness, and swelling  Apply ice to the back of your shoulder: ? If you have a removable splint or wrap, remove it as told by your health care provider. ? Put ice in a plastic bag. ? Place a towel between your skin and the bag. ? Leave the ice on for 20 minutes, 2-3 times per day.  Do not lift anything that is heavier than 10 lbs. (4.5 kg), or the limit that your health care provider tells you, until he or she says that it is safe.  Avoid activities that make your symptoms worse for 4-6 weeks, or as long as directed. General instructions  Ask your health care provider when it is safe for you to drive.  Do not use any products that contain nicotine or tobacco, such as cigarettes and e-cigarettes. These can delay bone healing. If you need help quitting, ask your health care  provider.  Drink enough fluid to keep your urine pale yellow.  Do physical therapy exercises as told by your health care provider.  Return to your normal activities as told by your health care provider. Ask your health care provider what activities are safe for you.  Keep all follow-up visits as told by your health care provider. This is important. Contact a health care provider if:  You have pain that is not relieved by medicine.  You are unable to do your physical therapy because of pain or stiffness. Get help right away if:  You are short of breath.  You cough up blood.  You cannot move your arm or your fingers. Summary  A scapular fracture is a break in the large, triangular bone behind your shoulder (shoulder blade or scapula).  The scapula is well protected by muscles, so scapular fractures are unusual injuries. They often involve a lot of force.  The main symptom of a scapular fracture is severe pain when you try to move your arm.  Immobilization, physical therapy, and surgery are used to treat this injury. Surgery is rarely done.  Follow your health care provider's instructions on taking medicines, using a wrap and splint, putting ice on the injured area, and resting from regular activities. This information is not intended to replace advice given to  you by your health care provider. Make sure you discuss any questions you have with your health care provider. Document Revised: 06/20/2017 Document Reviewed: 05/20/2017 Elsevier Patient Education  2020 [employer].   How To Use a Sling A sling is a type of hanging bandage that is worn around the neck to protect an injured arm, shoulder, or other body part. A sling may be needed to prevent movement of (to immobilize) the injured body part while it heals. Keeping the injured body part still can lessen pain and speed up healing. A health care provider may recommend using a sling:  To treat a broken arm or collarbone.  To  treat a shoulder injury.  After surgery. What are the risks? In general, wearing a sling helps with safe healing. However, in some cases, wearing a sling the wrong way can:  Make the injury worse.  Cause stiffness or numbness.  Affect blood flow (circulation) in the arm and hand. This can cause tingling or numbness in the fingers or hands. How to use a sling Follow instructions from your health care provider about how and when to wear your sling. Your health care provider will show you or tell you:  How to put on the sling.  How to adjust the sling.  When and how often to wear the sling.  How to remove the sling. The way that you need to use a sling depends on your injury. Unless your health care provider gives you different instructions, you should:  Wear the sling so that your elbow bends to the shape of a capital letter "L" (90 degrees, or a right angle).  Make sure the sling supports your wrist and your hand.  Adjust the sling if your fingers or hand start to tingle or feel numb. Follow these instructions at home:  Try to avoid moving your arm.  Do not twist, lift, or move your arm in a way that could make your injury worse.  Do not lean on your arm while wearing a sling.  Do not lift anything with the hand or arm that is in the sling. Contact a health care provider if:  You have bruising, swelling, or pain that gets worse.  You have pain that does not get better with medicine.  You have a fever.  Your sling is not supporting your arm properly.  Your sling gets damaged. Get help right away if you:  Have numbness or tingling in your fingers.  Notice that your fingers turn blue or feel cold to the touch.  Cannot control bleeding from your injury.  Have shortness of breath. Summary  A sling is a type of hanging bandage that is worn around the neck to protect an injured arm, shoulder, or other body part. A sling may be needed to prevent movement of (to  immobilize) the injured body part while it heals.  The way that you use a sling depends on your injury. Carefully follow instructions from your health care provider.  A good general rule is to wear the sling so that your arms bends 90 degrees (at a right angle) at the elbow. That is like the shape of a capital letter "L."  Make sure you know which problems should cause you to contact your health care provider or get help right away. This information is not intended to replace advice given to you by your health care provider. Make sure you discuss any questions you have with your health care provider. Document Revised: 03/21/2017  Document Reviewed: 02/27/2017 Elsevier Patient Education  [employer].

## 2019-06-24 ENCOUNTER — Other Ambulatory Visit: Payer: Self-pay | Admitting: *Deleted

## 2019-06-24 ENCOUNTER — Encounter: Payer: Self-pay | Admitting: Physician Assistant
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
## 2019-06-28 ENCOUNTER — Encounter: Payer: No Typology Code available for payment source | Admitting: Physician Assistant

## 2019-06-29 ENCOUNTER — Encounter: Payer: Self-pay | Admitting: *Deleted

## 2019-06-29 ENCOUNTER — Other Ambulatory Visit: Payer: Self-pay | Admitting: *Deleted
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
## 2019-06-30 ENCOUNTER — Encounter: Payer: Self-pay | Admitting: Physician Assistant

## 2019-06-30 DIAGNOSIS — S36113S Laceration of liver, unspecified degree, sequela: Secondary | ICD-10-CM

## 2019-07-01 NOTE — Addendum Note (Signed)
Addended by: Trey Sailors on: 07/01/2019 02:43 PM   Modules accepted: Orders

## 2019-07-06 DIAGNOSIS — S42111A Displaced fracture of body of scapula, right shoulder, initial encounter for closed fracture: Secondary | ICD-10-CM | POA: Insufficient documentation

## 2019-07-16 ENCOUNTER — Encounter: Payer: No Typology Code available for payment source | Admitting: Physician Assistant

## 2019-07-16 MED FILL — METHOCARBAMOL 500 MG TABS: 500 | 4 days supply | Qty: 30 | Fill #0

## 2019-07-19 ENCOUNTER — Ambulatory Visit (INDEPENDENT_AMBULATORY_CARE_PROVIDER_SITE_OTHER): Payer: No Typology Code available for payment source | Admitting: Physician Assistant

## 2019-07-19 ENCOUNTER — Other Ambulatory Visit: Payer: Self-pay

## 2019-07-19 ENCOUNTER — Encounter: Payer: Self-pay | Admitting: Physician Assistant

## 2019-07-19 VITALS — BP 118/66 | HR 99 | Temp 97.1°F | Ht 62.0 in | Wt 164.6 lb

## 2019-07-19 DIAGNOSIS — Z0000 Encounter for general adult medical examination without abnormal findings: Secondary | ICD-10-CM | POA: Diagnosis not present

## 2019-07-19 DIAGNOSIS — R748 Abnormal levels of other serum enzymes: Secondary | ICD-10-CM

## 2019-07-19 DIAGNOSIS — Z23 Encounter for immunization: Secondary | ICD-10-CM
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
## 2019-07-20 ENCOUNTER — Encounter: Payer: Self-pay | Admitting: Physician Assistant

## 2019-07-21 ENCOUNTER — Telehealth: Payer: Self-pay | Admitting: Physician Assistant

## 2019-07-21 LAB — COMPREHENSIVE METABOLIC PANEL
ALT: 57 IU/L — ABNORMAL HIGH (ref 0–32)
AST: 29 IU/L (ref 0–40)
Albumin/Globulin Ratio: 1.7 (ref 1.2–2.2)
Albumin: 4.9 g/dL (ref 3.9–5.0)
Alkaline Phosphatase: 97 IU/L (ref 39–117)
BUN/Creatinine Ratio: 12 (ref 9–23)
BUN: 10 mg/dL (ref 6–20)
Bilirubin Total: 0.3 mg/dL (ref 0.0–1.2)
CO2: 23 mmol/L (ref 20–29)
Calcium: 9.8 mg/dL (ref 8.7–10.2)
Chloride: 103 mmol/L (ref 96–106)
Creatinine, Ser: 0.81 mg/dL (ref 0.57–1.00)
GFR calc Af Amer: 116 mL/min/{1.73_m2} (ref >59)
GFR calc non Af Amer: 101 mL/min/{1.73_m2} (ref >59)
Globulin, Total: 2.9 g/dL (ref 1.5–4.5)
Glucose: 84 mg/dL (ref 65–99)
Potassium: 4.3 mmol/L (ref 3.5–5.2)
Sodium: 141 mmol/L (ref 134–144)
Total Protein: 7.8 g/dL (ref 6.0–8.5)

## 2019-07-21 LAB — QUANTIFERON-TB GOLD PLUS
QuantiFERON Mitogen Value: >10 IU/mL
QuantiFERON Nil Value: 0.02 IU/mL
QuantiFERON TB1 Ag Value: 0.03 IU/mL
QuantiFERON TB2 Ag Value: 0.03 IU/mL
QuantiFERON-TB Gold Plus: NEGATIVE

## 2019-07-21 LAB — MEASLES/MUMPS/RUBELLA IMMUNITY
MUMPS ABS, IGG: 40.2 AU/mL (ref >10.9)
RUBEOLA AB, IGG: 82.3 AU/mL (ref >16.4)
Rubella Antibodies, IGG: 6.84 index (ref >0.99)

## 2019-07-21 LAB — VARICELLA ZOSTER ABS, IGG/IGM
Varicella IgM: <0.91 index (ref 0.00–0.90)
Varicella zoster IgG: 1015 index (ref >165)

## 2019-07-21 LAB — HEPATITIS B SURFACE ANTIBODY,QUALITATIVE: Hep B Surface Ab, Qual: REACTIVE

## 2019-07-21 NOTE — Telephone Encounter (Signed)
Patient was advised and will come pick up her paperwork tomorrow,07/22/2019.

## 2019-07-21 NOTE — Telephone Encounter (Signed)
Completed school form. Can we please print out TB testing and also vaccine record and it will be ready for her to pick up.

## 2019-10-06 ENCOUNTER — Encounter: Payer: Self-pay | Admitting: Physician Assistant

## 2019-10-06 DIAGNOSIS — Z1283 Encounter for screening for malignant neoplasm of skin: Secondary | ICD-10-CM

## 2019-11-28 ENCOUNTER — Encounter: Payer: Self-pay | Admitting: Physician Assistant

## 2019-11-29 ENCOUNTER — Telehealth: Payer: Self-pay

## 2019-11-29 DIAGNOSIS — Z91013 Allergy to seafood: Secondary | ICD-10-CM

## 2019-11-29 MED ORDER — EPINEPHRINE 0.3 MG/0.3ML IJ SOAJ: 0.3000 mg | INTRAMUSCULAR | 1 refills | Status: DC | PRN

## 2019-11-29 MED FILL — EPINEPHRINE 0.3 MG AUTO-INJ: 0.3 | 30 days supply | Qty: 4 | Fill #0

## 2019-11-29 NOTE — Telephone Encounter (Signed)
Patient send a MyChart message requesting a refill on [place] E[place]Pens [place] Refill request was send into the pharmacy. L.O.V. was on 07/19/2019.

## 2019-12-31 ENCOUNTER — Telehealth: Payer: Self-pay

## 2019-12-31 NOTE — Telephone Encounter (Signed)
If she can get here at 10 I'll see her, you can double book my 10.

## 2019-12-31 NOTE — Telephone Encounter (Signed)
Is there an appt time today that you can squeeze patient in for an office visit, there is no available appointments today with any provider? KW

## 2019-12-31 NOTE — Telephone Encounter (Signed)
Pt returned message and said that she will see her OB for this issue.

## 2019-12-31 NOTE — Telephone Encounter (Signed)
Copied from CRM [id number]. Topic: General - Inquiry >> Dec 30, 2019  3:47 PM Samantha Bailey wrote: Reason for CRM: pt called in and state she has had Bailey few very faint positive pregnancy test and would like to know if she could come this afternoon for some blood work to verify?  Best number [phone].  She has to work the next to week and can only do it today

## 2019-12-31 NOTE — Telephone Encounter (Signed)
Called patient and no answer or could not leave a VM for patient. I send a MyChart message for patient to return cal if she could make it by 10:00 AM.

## 2020-01-01 ENCOUNTER — Encounter: Payer: Self-pay | Admitting: Physician Assistant

## 2020-01-13 NOTE — Progress Notes (Signed)
Established patient visit   Patient: Samantha Bailey   DOB: 02/13/1991   29 y.o. Female  MRN: [id number] Visit Date: 01/14/2020  Today's healthcare provider: Trey Sailors, PA-C   Chief Complaint  Patient presents with  . Labs Only   Subjective    HPI   Patient presents today in office to have a fertility blood work panel done, patient would also like to discuss ovulation medications. Reports she got off birth control this past July and has not had a menstrual cycle. She also has not ovulated according to her at home testing. She is wondering if she may have PCOS.  Prior to this she has not had irregular periods. No excess hair growth on chin or hair. No acne. No history of ultrasound with cysts. Interested in testing for this. Also interested in medications to induce ovulation. She has an appointment with her OBGYN in November but wanted to be treated sooner.      Medications: Outpatient Medications Prior to Visit  Medication Sig  . acetaminophen (TYLENOL) 325 MG tablet Take 2 tablets (650 mg total) by mouth every 6 (six) hours as needed.  Marland Kitchen EPINEPHrine 0.3 mg/0.3 mL IJ SOAJ injection Inject 0.3 mLs (0.3 mg total) into the muscle as needed for up to 4 doses for anaphylaxis.  Marland Kitchen ibuprofen (ADVIL,MOTRIN) 600 MG tablet Take 1 tablet (600 mg total) by mouth every 6 (six) hours as needed.  . loratadine (CLARITIN) 10 MG tablet Take 1 tablet (10 mg total) by mouth daily.  . ondansetron (ZOFRAN-ODT) 4 MG disintegrating tablet Take 1 tablet (4 mg total) by mouth every 8 (eight) hours as needed for nausea or vomiting.  . ondansetron (ZOFRAN-ODT) 4 MG disintegrating tablet Take 1 tablet (4 mg total) by mouth every 6 (six) hours as needed for nausea.  Marland Kitchen docusate sodium (COLACE) 100 MG capsule Take 1 capsule (100 mg total) by mouth 2 (two) times daily as needed for mild constipation.  . Levonorgestrel 13.5 MG IUD 13.5 mg once by Intrauterine route. Skla Placed on 12/29/2015  .  methocarbamol (ROBAXIN) 750 MG tablet Take 1 tablet (750 mg total) by mouth every 6 (six) hours as needed for muscle spasms. (Patient not taking: Reported on 01/14/2020)  . oxyCODONE (OXY IR/ROXICODONE) 5 MG immediate release tablet Take 1 tablet (5 mg total) by mouth every 6 (six) hours as needed for breakthrough pain.   No facility-administered medications prior to visit.    Review of Systems    Objective    BP 104/74 (BP Location: Right Arm, Patient Position: Sitting, Cuff Size: Large)   Pulse 83   Temp 98.1 F (36.7 C) (Oral)   Wt 163 lb 3.2 oz (74 kg)   BMI 29.85 kg/m    Physical Exam Constitutional:      Appearance: Normal appearance.  Cardiovascular:     Rate and Rhythm: Normal rate and regular rhythm.     Heart sounds: Normal heart sounds.  Pulmonary:     Effort: Pulmonary effort is normal.     Breath sounds: Normal breath sounds.  Skin:    General: Skin is warm and dry.  Neurological:     Mental Status: She is alert and oriented to person, place, and time. Mental status is at baseline.  Psychiatric:        Mood and Affect: Mood normal.        Behavior: Behavior normal.       No results found for any visits on  01/14/20.  Assessment & Plan    1. Amenorrhea  No clinical signs of PCOS but will test as below. Advised patient to follow up with OBGYN for ovulation concerns.   - Beta HCG, Quant - Testosterone,Free and Total - 17-Hydroxyprogesterone    Return if symptoms worsen or fail to improve.      ITrey Sailors, PA-C, have reviewed all documentation for this visit. The documentation on 01/14/20 for the exam, diagnosis, procedures, and orders are all accurate and complete.  The entirety of the information documented in the History of Present Illness, Review of Systems and Physical Exam were personally obtained by me. Portions of this information were initially documented by Angelica Ran, CMA and reviewed by me for thoroughness and accuracy.   I  spent 20 minutes dedicated to the care of this patient on the date of this encounter to include pre-visit review of records, face-to-face time with the patient discussing infertility, and post visit ordering of testing.    Maryella Shivers  [facility] [phone] (phone) [phone] (fax)  [facility]

## 2020-01-14 ENCOUNTER — Encounter: Payer: Self-pay | Admitting: Physician Assistant

## 2020-01-14 ENCOUNTER — Other Ambulatory Visit: Payer: Self-pay

## 2020-01-14 ENCOUNTER — Ambulatory Visit (INDEPENDENT_AMBULATORY_CARE_PROVIDER_SITE_OTHER): Payer: No Typology Code available for payment source | Admitting: Physician Assistant

## 2020-01-14 VITALS — BP 104/74 | HR 83 | Temp 98.1°F | Wt 163.2 lb

## 2020-01-14 DIAGNOSIS — N912 Amenorrhea, unspecified: Secondary | ICD-10-CM | POA: Diagnosis not present

## 2020-01-14 NOTE — Patient Instructions (Signed)

## 2020-01-20 LAB — TESTOSTERONE,FREE AND TOTAL
Testosterone, Free: 5.6 pg/mL — ABNORMAL HIGH (ref 0.0–4.2)
Testosterone: 43 ng/dL (ref 13–71)

## 2020-01-20 LAB — 17-HYDROXYPROGESTERONE: 17-Hydroxyprogesterone: 54 ng/dL

## 2020-01-20 LAB — BETA HCG QUANT (REF LAB): hCG Quant: <1 m[IU]/mL

## 2020-01-27 ENCOUNTER — Other Ambulatory Visit (HOSPITAL_COMMUNITY): Payer: Self-pay | Admitting: Obstetrics and Gynecology

## 2020-01-27 MED FILL — LETROZOLE 2.5 MG TABS: 2.5 | 5 days supply | Qty: 5 | Fill #0

## 2020-01-28 ENCOUNTER — Other Ambulatory Visit (HOSPITAL_COMMUNITY): Payer: Self-pay | Admitting: Obstetrics and Gynecology

## 2020-01-31 MED FILL — LETROZOLE 2.5 MG TABS: 2.5 | 1 days supply | Qty: 1 | Fill #0

## 2020-02-18 ENCOUNTER — Other Ambulatory Visit (HOSPITAL_COMMUNITY): Payer: Self-pay | Admitting: Obstetrics and Gynecology

## 2020-02-18 MED FILL — LETROZOLE 2.5 MG TABS: 2.5 | 5 days supply | Qty: 10 | Fill #0

## 2020-02-29 ENCOUNTER — Encounter: Payer: Self-pay | Admitting: Physician Assistant

## 2020-02-29 DIAGNOSIS — N912 Amenorrhea, unspecified: Secondary | ICD-10-CM

## 2020-04-25 ENCOUNTER — Other Ambulatory Visit (HOSPITAL_COMMUNITY): Payer: Self-pay | Admitting: Reproductive Endocrinology and Infertility

## 2020-04-25 MED FILL — METFORMIN HCL 1000 MG TABS: 1000 | 30 days supply | Qty: 60 | Fill #0

## 2020-04-25 MED FILL — LETROZOLE 2.5 MG TABS: 2.5 | 5 days supply | Qty: 15 | Fill #0

## 2020-04-25 MED FILL — OVIDREL 250 MCG/0.5 ML SYRG: 250 | 30 days supply | Qty: 1 | Fill #0

## 2020-05-02 ENCOUNTER — Other Ambulatory Visit (HOSPITAL_COMMUNITY): Payer: Self-pay | Admitting: Obstetrics and Gynecology

## 2020-05-04 MED FILL — MEDROXYPROGESTERONE 10 MG T: 10 | 10 days supply | Qty: 10 | Fill #0

## 2020-06-05 ENCOUNTER — Encounter: Payer: Self-pay | Admitting: Physician Assistant

## 2020-06-05 DIAGNOSIS — J329 Chronic sinusitis, unspecified: Secondary | ICD-10-CM

## 2020-06-28 MED FILL — OVIDREL 250 MCG/0.5 ML SYRG: 250 | 30 days supply | Qty: 1 | Fill #1

## 2020-06-28 MED FILL — LETROZOLE 2.5 MG TABS: 2.5 | 5 days supply | Qty: 15 | Fill #1

## 2020-07-09 ENCOUNTER — Encounter: Payer: Self-pay | Admitting: Physician Assistant

## 2020-07-10 ENCOUNTER — Other Ambulatory Visit: Payer: Self-pay

## 2020-07-10 DIAGNOSIS — Z91013 Allergy to seafood: Secondary | ICD-10-CM

## 2020-07-10 MED ORDER — EPINEPHRINE 0.3 MG/0.3ML IJ SOAJ: 0.3000 mg | INTRAMUSCULAR | 1 refills | Status: DC | PRN

## 2020-07-10 MED FILL — EPINEPHRINE 0.3 MG AUTO-INJ: 0.3 | 14 days supply | Qty: 4 | Fill #0

## 2020-07-24 ENCOUNTER — Other Ambulatory Visit (HOSPITAL_COMMUNITY): Payer: Self-pay

## 2020-07-24 MED FILL — Choriogonadotropin Alfa Inj 250 MCG/0.5ML: SUBCUTANEOUS | 30 days supply | Qty: 0.5 | Fill #0 | Status: AC

## 2020-07-24 MED FILL — Choriogonadotropin Alfa Inj 250 MCG/0.5ML: SUBCUTANEOUS | 1 days supply | Qty: 0.5 | Fill #0 | Status: CN

## 2020-07-24 MED FILL — Letrozole Tab 2.5 MG: ORAL | 5 days supply | Qty: 15 | Fill #0 | Status: AC

## 2020-07-30 MED FILL — Metformin HCl Tab 1000 MG: ORAL | 30 days supply | Qty: 60 | Fill #0 | Status: AC

## 2020-07-31 ENCOUNTER — Other Ambulatory Visit (HOSPITAL_COMMUNITY): Payer: Self-pay

## 2020-08-16 ENCOUNTER — Other Ambulatory Visit (HOSPITAL_COMMUNITY): Payer: Self-pay

## 2020-08-16 MED FILL — Choriogonadotropin Alfa Inj 250 MCG/0.5ML: SUBCUTANEOUS | 1 days supply | Qty: 0.5 | Fill #1 | Status: CN

## 2020-08-16 MED FILL — Letrozole Tab 2.5 MG: ORAL | 5 days supply | Qty: 15 | Fill #1 | Status: AC

## 2020-08-17 ENCOUNTER — Other Ambulatory Visit (HOSPITAL_COMMUNITY): Payer: Self-pay

## 2020-08-25 ENCOUNTER — Other Ambulatory Visit (HOSPITAL_COMMUNITY): Payer: Self-pay

## 2020-08-27 MED FILL — Choriogonadotropin Alfa Inj 250 MCG/0.5ML: SUBCUTANEOUS | 30 days supply | Qty: 0.5 | Fill #1 | Status: AC

## 2020-08-27 MED FILL — Metformin HCl Tab 1000 MG: ORAL | 30 days supply | Qty: 60 | Fill #1 | Status: AC

## 2020-08-28 ENCOUNTER — Other Ambulatory Visit (HOSPITAL_COMMUNITY): Payer: Self-pay

## 2020-09-21 ENCOUNTER — Other Ambulatory Visit (HOSPITAL_COMMUNITY): Payer: Self-pay

## 2020-09-21 MED ORDER — CETROTIDE 0.25 MG ~~LOC~~ KIT
PACK | SUBCUTANEOUS | 1 refills | Status: DC
  Filled 2020-09-21: qty 5, 5d supply, fill #0

## 2020-09-21 MED ORDER — "BD DISP NEEDLES 22G X 1-1/2"" MISC"
2 refills | Status: DC
  Filled 2020-09-21: qty 35, 30d supply, fill #0
  Filled 2021-01-23 – 2021-01-25 (×2): qty 35, 30d supply, fill #1

## 2020-09-21 MED ORDER — "NEEDLE (DISP) 27G X 1/2"" MISC"
1 refills | Status: DC
  Filled 2020-09-21: qty 20, 30d supply, fill #0

## 2020-09-21 MED ORDER — PROGESTERONE 50 MG/ML IM OIL
50.0000 mg | TOPICAL_OIL | Freq: Every day | INTRAMUSCULAR | 2 refills | Status: DC
  Filled 2020-09-21: qty 30, 30d supply, fill #0
  Filled 2021-01-23: qty 30, 30d supply, fill #1

## 2020-09-21 MED ORDER — LEUPROLIDE ACETATE 1 MG/0.2ML IJ KIT
PACK | Freq: Every day | INTRAMUSCULAR | 1 refills | Status: DC
  Filled 2020-09-21: qty 1, 1d supply, fill #0
  Filled 2020-09-22: qty 1, 14d supply, fill #0
  Filled 2020-09-26: qty 1, 10d supply, fill #0
  Filled 2020-09-28: qty 1, 1d supply, fill #0

## 2020-09-21 MED ORDER — DOXYCYCLINE HYCLATE 100 MG PO TABS
100.0000 mg | ORAL_TABLET | Freq: Two times a day (BID) | ORAL | 0 refills | Status: DC
  Filled 2020-09-21: qty 40, 20d supply, fill #0

## 2020-09-21 MED ORDER — "BD LUER-LOK SYRINGE 18G X 1-1/2"" 3 ML MISC"
2 refills | Status: DC
  Filled 2020-09-21 – 2021-01-23 (×2): qty 60, 30d supply, fill #0

## 2020-09-21 MED ORDER — MENOTROPINS 75 UNITS ~~LOC~~ SOLR
75.0000 [IU] | Freq: Every day | SUBCUTANEOUS | 1 refills | Status: DC
  Filled 2020-09-21: qty 10, 10d supply, fill #0

## 2020-09-21 MED ORDER — CHORIONIC GONADOTROPIN 10000 UNITS IM SOLR
INTRAMUSCULAR | 0 refills | Status: DC
  Filled 2020-09-21: qty 1, 1d supply, fill #0

## 2020-09-21 MED ORDER — METHYLPREDNISOLONE 4 MG PO TABS
8.0000 mg | ORAL_TABLET | Freq: Two times a day (BID) | ORAL | 0 refills | Status: DC
  Filled 2020-09-21: qty 16, 4d supply, fill #0

## 2020-09-21 MED ORDER — ESTRADIOL 2 MG PO TABS
2.0000 mg | ORAL_TABLET | Freq: Two times a day (BID) | ORAL | 2 refills | Status: DC
  Filled 2020-09-21: qty 60, 30d supply, fill #0
  Filled 2020-12-03: qty 60, 30d supply, fill #1

## 2020-09-22 ENCOUNTER — Other Ambulatory Visit (HOSPITAL_COMMUNITY): Payer: Self-pay

## 2020-09-22 MED ORDER — ESTRADIOL 0.1 MG/24HR TD PTTW
1.0000 | MEDICATED_PATCH | TRANSDERMAL | 3 refills | Status: DC
  Filled 2020-09-22: qty 8, 28d supply, fill #0
  Filled 2020-11-08: qty 8, 28d supply, fill #1

## 2020-09-22 MED ORDER — FOLLITROPIN ALFA 1050 UNITS IJ SOLR
INTRAMUSCULAR | 1 refills | Status: DC
  Filled 2020-09-22: qty 2, 8d supply, fill #0
  Filled 2020-09-22: qty 2, 2d supply, fill #0

## 2020-09-22 MED ORDER — "SYRINGE/NEEDLE (DISP) 21G X 1-1/2"" 3 ML MISC"
2 refills | Status: DC
  Filled 2020-09-22: qty 60, 30d supply, fill #0

## 2020-09-22 MED FILL — Metformin HCl Tab 1000 MG: ORAL | 30 days supply | Qty: 60 | Fill #2 | Status: AC

## 2020-09-25 ENCOUNTER — Other Ambulatory Visit (HOSPITAL_COMMUNITY): Payer: Self-pay

## 2020-09-26 ENCOUNTER — Other Ambulatory Visit (HOSPITAL_COMMUNITY): Payer: Self-pay

## 2020-09-28 ENCOUNTER — Other Ambulatory Visit (HOSPITAL_COMMUNITY): Payer: Self-pay

## 2020-09-29 ENCOUNTER — Other Ambulatory Visit (HOSPITAL_COMMUNITY): Payer: Self-pay

## 2020-10-02 ENCOUNTER — Other Ambulatory Visit (HOSPITAL_COMMUNITY): Payer: Self-pay

## 2020-10-03 ENCOUNTER — Other Ambulatory Visit (HOSPITAL_COMMUNITY): Payer: Self-pay

## 2020-10-12 LAB — HM PAP SMEAR: HM Pap smear: NORMAL

## 2020-10-12 LAB — RESULTS CONSOLE HPV: CHL HPV: NEGATIVE

## 2020-10-20 ENCOUNTER — Other Ambulatory Visit (HOSPITAL_COMMUNITY): Payer: Self-pay

## 2020-10-20 MED ORDER — FOLLITROPIN ALFA 900 UNIT/1.5ML ~~LOC~~ SOPN
300.0000 [IU] | PEN_INJECTOR | Freq: Every day | SUBCUTANEOUS | 1 refills | Status: DC
  Filled 2020-10-20: qty 1.5, 3d supply, fill #0

## 2020-10-24 ENCOUNTER — Other Ambulatory Visit (HOSPITAL_COMMUNITY): Payer: Self-pay

## 2020-10-24 MED FILL — Metformin HCl Tab 1000 MG: ORAL | 30 days supply | Qty: 60 | Fill #3 | Status: AC

## 2020-11-01 ENCOUNTER — Ambulatory Visit (INDEPENDENT_AMBULATORY_CARE_PROVIDER_SITE_OTHER): Payer: No Typology Code available for payment source | Admitting: Family Medicine

## 2020-11-01 ENCOUNTER — Encounter: Payer: Self-pay | Admitting: Family Medicine

## 2020-11-01 ENCOUNTER — Other Ambulatory Visit: Payer: Self-pay

## 2020-11-01 VITALS — BP 108/72 | HR 105 | Ht 62.0 in | Wt 175.0 lb

## 2020-11-01 DIAGNOSIS — Z0000 Encounter for general adult medical examination without abnormal findings: Secondary | ICD-10-CM | POA: Diagnosis not present

## 2020-11-01 NOTE — Patient Instructions (Addendum)
Please review the attached list of medications and notify my office if there are any errors.   I strongly recommend at least one booster dose (a third shot) of the Covid vaccine for all adults. People over the age of 63 or at high risk for serious Covid infections should have a second booster dose (a fourth shot) 4-6 months after the first booster.    [name], NP will be joining [facility] in July 2022, and [name], [place] will be joining in October 2022. You can call [facility] at [phone] at to schedule a follow up appointment with [name] or [name].

## 2020-11-01 NOTE — Progress Notes (Signed)
Complete physical exam   Patient: Samantha Bailey   DOB: Jul 03, 1990   30 y.o. Female  MRN: [id number] Visit Date: 11/01/2020  Today's healthcare provider: Lelon Huh, MD   Chief Complaint  Patient presents with   Annual Exam   Subjective    Samantha Bailey is a 30 y.o. female who presents today for a complete physical exam.  She reports consuming a general diet.  Exercises regularly  She generally feels well. She reports sleeping well. She does not have additional problems to discuss today.  Has been undergoing fertility treatment. Had pap/pelvic HPV last month with her Gyn in [place].    Past Medical History:  Diagnosis Date   Allergic rhinitis    Chlamydia 2008   Past Surgical History:  Procedure Laterality Date   IR ANGIOGRAM SELECTIVE EACH ADDITIONAL VESSEL  06/20/2019   IR ANGIOGRAM SELECTIVE EACH ADDITIONAL VESSEL  06/20/2019   IR ANGIOGRAM SELECTIVE EACH ADDITIONAL VESSEL  06/20/2019   IR ANGIOGRAM SELECTIVE EACH ADDITIONAL VESSEL  06/20/2019   IR ANGIOGRAM SELECTIVE EACH ADDITIONAL VESSEL  06/20/2019   IR ANGIOGRAM SELECTIVE EACH ADDITIONAL VESSEL  06/20/2019   IR ANGIOGRAM SELECTIVE EACH ADDITIONAL VESSEL  06/20/2019   IR ANGIOGRAM VISCERAL SELECTIVE  06/20/2019   IR EMBO ART  VEN HEMORR LYMPH EXTRAV  INC GUIDE ROADMAPPING  06/20/2019   IR US GUIDE VASC ACCESS RIGHT  06/20/2019   WISDOM TOOTH EXTRACTION  2013   no problems with anesthesia   Social History   Socioeconomic History   Marital status: Married    Spouse name: Broadus John   Number of children: 0   Years of education: college   Highest education level: Not on file  Occupational History    Employer: Bartender    Comment: [employer]   Occupation: student    Comment: Nursing School at [employer] Use   Smoking status: Never   Smokeless tobacco: Never  Vaping Use   Vaping Use: Never used  Substance and Sexual Activity   Alcohol use: No   Drug use: No   Sexual  activity: Yes    Partners: Male    Birth control/protection: I.U.D.  Other Topics Concern   Not on file  Social History Narrative   ** Merged History Encounter **       Social Determinants of Health   Financial Resource Strain: Not on file  Food Insecurity: Not on file  Transportation Needs: Not on file  Physical Activity: Not on file  Stress: Not on file  Social Connections: Not on file  Intimate Partner Violence: Not on file   Family Status  Relation Name Status   Mother  Alive   Father  Alive   MGM  Alive   MGF  Alive   PGM  Alive   PGF  Alive   Neg Hx  (Not Specified)   Family History  Problem Relation Age of Onset   Healthy Mother    Healthy Father    Healthy Maternal Grandmother    Healthy Maternal Grandfather    Healthy Paternal Grandmother    Healthy Paternal Grandfather    Breast cancer Neg Hx    Cancer - Colon Neg Hx    Cervical cancer Neg Hx    Colon cancer Neg Hx    Ovarian cancer Neg Hx    Allergies  Allergen Reactions   Shellfish Allergy Anaphylaxis   Shellfish Allergy Swelling    Patient Care Team: Carles Collet  Curt Jews as PCP - General (Physician Assistant) Paulene Floor (Physician Assistant) Meisinger, Sherren Mocha, MD as Consulting Physician (Obstetrics and Gynecology)   Medications: Outpatient Medications Prior to Visit  Medication Sig   acetaminophen (TYLENOL) 325 MG tablet Take 2 tablets (650 mg total) by mouth every 6 (six) hours as needed.   Cetrorelix Acetate (CETROTIDE) 0.25 MG KIT Reconstitute and inject 1 syringe daily as directed   doxycycline (VIBRA-TABS) 100 MG tablet Take 1 tablet (100 mg total) by mouth 2 (two) times daily.   EPINEPHrine 0.3 mg/0.3 mL IJ SOAJ injection INJECT 0.3 MG INTO THE MUSCLE AS NEEDED FOR UP TO 4 DOSES FOR ANAPHYLAXIS.   estradiol (ESTRACE) 2 MG tablet Take 1 tablet (2 mg total) by mouth 2 (two) times daily.   estradiol (VIVELLE-DOT) 0.1 MG/24HR patch Place 1 patch (0.1 mg total) onto the skin  every 3 (three) days.   Follitropin Alfa 1050 units SOLR Inject 262 units subcutaneously daily as directed   Follitropin Alfa 900 UNIT/1.5ML SOPN Inject 300 Units into the skin daily as directed.   human chorionic gonadotropin (PREGNYL/NOVAREL) 10000 units injection Mix with 1 cc of diluent & inject at exact time given as directed   ibuprofen (ADVIL,MOTRIN) 600 MG tablet Take 1 tablet (600 mg total) by mouth every 6 (six) hours as needed.   letrozole (FEMARA) 2.5 MG tablet Take 3 tablets (7.5 mg total) by mouth daily on menstrual cycle days 3-7   letrozole (FEMARA) 2.5 MG tablet TAKE 2 TABLETS EVERY DAY BY MOUTH FOR 5 DAYS   letrozole (FEMARA) 2.5 MG tablet TAKE 1 TABLET BY MOUTH FOR 1 DAY   letrozole (FEMARA) 2.5 MG tablet TAKE 1 TABLET BY MOUTH ONCE DAILY FOR 5 DAYS   leuprolide (LUPRON) 1 MG/0.2ML injection Inject 80 units into the skin daily.   medroxyPROGESTERone (PROVERA) 10 MG tablet TAKE 1 TABLET BY MOUTH ONCE A DAY FOR 10 DAYS   Menotropins 75 units SOLR Inject 75 Units into the skin daily.   metFORMIN (GLUCOPHAGE) 1000 MG tablet DAYS 1-3: 1/2 TAB BY MOUTH IN THE AM. DAYS 4-6: 1/2 IN THE AM AND 1/2 IN THE PM. DAYS 7-9: 1 IN AM AND 1/2 IN PM. DAY 10: 1 IN AM & 1 IN PM AND CONTINUE   methocarbamol (ROBAXIN) 750 MG tablet Take 1 tablet (750 mg total) by mouth every 6 (six) hours as needed for muscle spasms.   methylPREDNISolone (MEDROL) 4 MG tablet Take 2 tablets (8 mg total) by mouth 2 (two) times daily.   NEEDLE, DISP, 22 G (BD DISP NEEDLES) 22G X 1-1/2" MISC use as directed   NEEDLE, DISP, 27 G 27G X 1/2" MISC Use as directed   ondansetron (ZOFRAN-ODT) 4 MG disintegrating tablet Take 1 tablet (4 mg total) by mouth every 8 (eight) hours as needed for nausea or vomiting.   ondansetron (ZOFRAN-ODT) 4 MG disintegrating tablet Take 1 tablet (4 mg total) by mouth every 6 (six) hours as needed for nausea.   progesterone 50 MG/ML injection Inject 1 mL (50 mg total) into the muscle daily.    SYRINGE-NEEDLE, DISP, 3 ML (B-D 3CC LUER-LOK SYR 18GX1-1/2) 18G X 1-1/2" 3 ML MISC use as directed   SYRINGE-NEEDLE, DISP, 3 ML 21G X 1-1/2" 3 ML MISC Use as directed   docusate sodium (COLACE) 100 MG capsule Take 1 capsule (100 mg total) by mouth 2 (two) times daily as needed for mild constipation.   Levonorgestrel 13.5 MG IUD 13.5 mg once by Intrauterine  route. [place] Placed on 12/29/2015   loratadine (CLARITIN) 10 MG tablet Take 1 tablet (10 mg total) by mouth daily.   oxyCODONE (OXY IR/ROXICODONE) 5 MG immediate release tablet Take 1 tablet (5 mg total) by mouth every 6 (six) hours as needed for breakthrough pain.   No facility-administered medications prior to visit.     Objective    BP 108/72 (BP Location: Right Arm, Patient Position: Sitting, Cuff Size: Large)   Pulse (!) 105   Ht 5' 2"  (5.329 m)   Wt 175 lb (79.4 kg)   SpO2 98%   BMI 32.01 kg/m    Physical Exam   General Appearance:    Mildly obese female. Alert, cooperative, in no acute distress, appears stated age   Head:    Normocephalic, without obvious abnormality, atraumatic  Eyes:    PERRL, conjunctiva/corneas clear, EOM's intact, fundi    benign, both eyes  Ears:    Normal TM's and external ear canals, both ears  Neck:   Supple, symmetrical, trachea midline, no adenopathy;    thyroid:  no enlargement/tenderness/nodules; no carotid   bruit or JVD  Back:     Symmetric, no curvature, ROM normal, no CVA tenderness  Lungs:     Clear to auscultation bilaterally, respirations unlabored  Chest Wall:    No tenderness or deformity   Heart:    Tachycardic. Normal rhythm. No murmurs, rubs, or gallops.   Breast Exam:    deferred  Abdomen:     Soft, non-tender, bowel sounds active all four quadrants,    no masses, no organomegaly  Pelvic:    deferred  Extremities:   All extremities are intact. No cyanosis or edema  Pulses:   2+ and symmetric all extremities  Skin:   Skin color, texture, turgor normal, no rashes or lesions   Lymph nodes:   Cervical, supraclavicular, and axillary nodes normal  Neurologic:   CNII-XII intact, normal strength, sensation and reflexes    throughout     Last depression screening scores PHQ 2/9 Scores 07/19/2019 04/08/2018 02/03/2017  PHQ - 2 Score 0 0 0  PHQ- 9 Score 0 0 -   Last fall risk screening Fall Risk  07/19/2019  Falls in the past year? 0  Number falls in past yr: 0  Injury with Fall? 0   Last Audit-C alcohol use screening Alcohol Use Disorder Test (AUDIT) 07/19/2019  1. How often do you have a drink containing alcohol? 1  2. How many drinks containing alcohol do you have on a typical day when you are drinking? 1  3. How often do you have six or more drinks on one occasion? 0  AUDIT-C Score 2   A score of 3 or more in women, and 4 or more in men indicates increased risk for alcohol abuse, EXCEPT if all of the points are from question 1   No results found for any visits on 11/01/20.  Assessment & Plan    Routine Health Maintenance and Physical Exam  Exercise Activities and Dietary recommendations  Goals   None     Immunization History  Administered Date(s) Administered   DTaP 12/08/1992, 04/09/1993, 08/02/1993, 12/16/1996   HPV Quadrivalent 02/24/2007, 05/11/2007, 11/17/2007   Hepatitis B 05-24-1992, 12/08/1992, 01/14/1994   IPV 12/08/1992, 04/09/1993, 08/02/1993, 12/16/1996   Influenza,inj,Quad PF,6+ Mos 02/03/2017, 04/08/2018   MMR 09/13/1993, 12/16/1996, 10/11/2016   Meningococcal Conjugate 02/24/2007   PPD Test 12/08/2015   Td 04/08/2018   Tdap 02/24/2007, 07/19/2019    Health  Maintenance  Topic Date Due   COVID-19 Vaccine (1) Never done   Hepatitis C Screening  Never done   PAP-Cervical Cytology Screening  11/04/2020   PAP SMEAR-Modifier  11/04/2020   INFLUENZA VACCINE  11/20/2020   TETANUS/TDAP  07/18/2029   HPV VACCINES  Completed   HIV Screening  Completed   Pneumococcal Vaccine 70-31 Years old  Aged Out    Discussed health benefits  of physical activity, and encouraged her to engage in regular exercise appropriate for her age and condition.  1. Annual physical exam Mildly obese, otherwise normal exam. Breast/pap/pelvic deferred to gyn.   - Comprehensive metabolic panel - CBC - Lipid panel  Recommended she get covid booster.       The entirety of the information documented in the History of Present Illness, Review of Systems and Physical Exam were personally obtained by me. Portions of this information were initially documented by the CMA and reviewed by me for thoroughness and accuracy.     Lelon Huh, MD  [facility] [phone] (phone) [phone] (fax)  [place]

## 2020-11-08 ENCOUNTER — Other Ambulatory Visit (HOSPITAL_COMMUNITY): Payer: Self-pay

## 2020-11-18 ENCOUNTER — Encounter: Payer: Self-pay | Admitting: Family Medicine

## 2020-11-18 DIAGNOSIS — T753XXA Motion sickness, initial encounter: Secondary | ICD-10-CM

## 2020-11-20 ENCOUNTER — Other Ambulatory Visit (HOSPITAL_COMMUNITY): Payer: Self-pay

## 2020-11-20 MED ORDER — ESTRADIOL 0.1 MG/24HR TD PTTW
2.0000 | MEDICATED_PATCH | TRANSDERMAL | 2 refills | Status: DC
  Filled 2020-11-20: qty 8, 12d supply, fill #0
  Filled 2020-11-21: qty 16, 28d supply, fill #0
  Filled 2021-01-18: qty 16, 28d supply, fill #1

## 2020-11-21 ENCOUNTER — Other Ambulatory Visit (HOSPITAL_COMMUNITY): Payer: Self-pay

## 2020-11-21 MED ORDER — SCOPOLAMINE 1 MG/3DAYS TD PT72
1.0000 | MEDICATED_PATCH | TRANSDERMAL | 0 refills | Status: DC
  Filled 2020-11-21: qty 4, 12d supply, fill #0

## 2020-11-22 ENCOUNTER — Other Ambulatory Visit (HOSPITAL_COMMUNITY): Payer: Self-pay

## 2020-11-22 MED FILL — Metformin HCl Tab 1000 MG: ORAL | 30 days supply | Qty: 60 | Fill #4 | Status: AC

## 2020-11-23 ENCOUNTER — Other Ambulatory Visit (HOSPITAL_COMMUNITY): Payer: Self-pay

## 2020-11-23 MED ORDER — MEDROXYPROGESTERONE ACETATE 10 MG PO TABS
10.0000 mg | ORAL_TABLET | Freq: Every day | ORAL | 0 refills | Status: DC
  Filled 2020-11-23: qty 5, 5d supply, fill #0

## 2020-12-04 ENCOUNTER — Other Ambulatory Visit (HOSPITAL_COMMUNITY): Payer: Self-pay

## 2020-12-19 ENCOUNTER — Other Ambulatory Visit (HOSPITAL_COMMUNITY): Payer: Self-pay

## 2020-12-20 ENCOUNTER — Other Ambulatory Visit (HOSPITAL_COMMUNITY): Payer: Self-pay

## 2020-12-20 MED ORDER — MEDROXYPROGESTERONE ACETATE 10 MG PO TABS
10.0000 mg | ORAL_TABLET | Freq: Every day | ORAL | 2 refills | Status: DC
  Filled 2020-12-20: qty 5, 5d supply, fill #0

## 2020-12-21 ENCOUNTER — Other Ambulatory Visit (HOSPITAL_COMMUNITY): Payer: Self-pay

## 2020-12-21 MED ORDER — CHORIOGONADOTROPIN ALFA 250 MCG/0.5ML ~~LOC~~ INJ
INJECTION | SUBCUTANEOUS | 1 refills | Status: DC
  Filled 2020-12-21: qty 0.5, 30d supply, fill #0

## 2020-12-21 MED ORDER — FOLLITROPIN ALFA 900 UNIT/1.5ML ~~LOC~~ SOPN
PEN_INJECTOR | SUBCUTANEOUS | 1 refills | Status: DC
  Filled 2020-12-21: qty 1.5, 30d supply, fill #0

## 2020-12-21 MED ORDER — CETROTIDE 0.25 MG ~~LOC~~ KIT
PACK | SUBCUTANEOUS | 1 refills | Status: DC
  Filled 2020-12-21: qty 4, 30d supply, fill #0

## 2020-12-26 ENCOUNTER — Other Ambulatory Visit (HOSPITAL_COMMUNITY): Payer: Self-pay

## 2020-12-26 MED ORDER — GANIRELIX ACETATE 250 MCG/0.5ML ~~LOC~~ SOSY
PREFILLED_SYRINGE | SUBCUTANEOUS | 1 refills | Status: DC
  Filled 2020-12-26: qty 2, 4d supply, fill #0

## 2020-12-27 ENCOUNTER — Other Ambulatory Visit (HOSPITAL_COMMUNITY): Payer: Self-pay

## 2020-12-28 ENCOUNTER — Other Ambulatory Visit (HOSPITAL_COMMUNITY): Payer: Self-pay

## 2020-12-28 MED FILL — Metformin HCl Tab 1000 MG: ORAL | 30 days supply | Qty: 60 | Fill #5 | Status: AC

## 2021-01-18 ENCOUNTER — Other Ambulatory Visit (HOSPITAL_COMMUNITY): Payer: Self-pay

## 2021-01-24 ENCOUNTER — Other Ambulatory Visit (HOSPITAL_COMMUNITY): Payer: Self-pay

## 2021-01-25 ENCOUNTER — Other Ambulatory Visit (HOSPITAL_COMMUNITY): Payer: Self-pay

## 2021-01-25 MED ORDER — "NEEDLE (DISP) 22G X 1"" MISC"
1 refills | Status: DC
  Filled 2021-01-25: qty 35, 35d supply, fill #0

## 2021-02-24 ENCOUNTER — Inpatient Hospital Stay (HOSPITAL_COMMUNITY): Payer: No Typology Code available for payment source

## 2021-02-24 ENCOUNTER — Inpatient Hospital Stay (HOSPITAL_COMMUNITY)
Admission: AD | Admit: 2021-02-24 | Discharge: 2021-02-24 | Disposition: A | Discharge status: Home / Self Care | Payer: No Typology Code available for payment source | Attending: Obstetrics and Gynecology | Admitting: Obstetrics and Gynecology

## 2021-02-24 ENCOUNTER — Encounter (HOSPITAL_COMMUNITY): Payer: Self-pay | Admitting: Obstetrics and Gynecology

## 2021-02-24 ENCOUNTER — Other Ambulatory Visit: Payer: Self-pay

## 2021-02-24 DIAGNOSIS — O09811 Supervision of pregnancy resulting from assisted reproductive technology, first trimester: Secondary | ICD-10-CM | POA: Insufficient documentation

## 2021-02-24 DIAGNOSIS — O4691 Antepartum hemorrhage, unspecified, first trimester: Secondary | ICD-10-CM | POA: Diagnosis not present

## 2021-02-24 DIAGNOSIS — Z3A08 8 weeks gestation of pregnancy: Secondary | ICD-10-CM | POA: Diagnosis not present

## 2021-02-24 DIAGNOSIS — Z6790 Unspecified blood type, Rh positive: Secondary | ICD-10-CM

## 2021-02-24 DIAGNOSIS — O209 Hemorrhage in early pregnancy, unspecified: Secondary | ICD-10-CM | POA: Diagnosis present

## 2021-02-24 DIAGNOSIS — Z3491 Encounter for supervision of normal pregnancy, unspecified, first trimester: Secondary | ICD-10-CM

## 2021-02-24 HISTORY — DX: Other specified health status: Z78.9

## 2021-02-24 NOTE — MAU Provider Note (Signed)
Chief Complaint: Vaginal Bleeding   Event Date/Time   First Provider Initiated Contact with Patient 02/24/21 2246      SUBJECTIVE HPI: Samantha Bailey is a 30 y.o. G2P0010 at 73w5dby date of embryo transfer who presents to maternity admissions reporting abdominal cramping and vaginal bleeding. She was cleared by fertility/IVF practice to begin OB  care after a normal ultrasound yesterday, 02/23/21, but then started having sharp lower abdominal cramping followed by light bleeding last night.  She reports the bleeding continued today and got a little heavier with some small clots/tissue seen.     Location: lower abdomen Quality: cramping Severity: 3/10 on pain scale Duration: 1 day Timing: intermittent Modifying factors: none Associated signs and symptoms: vaginal bleeding  HPI  Past Medical History:  Diagnosis Date   Allergic rhinitis    Chlamydia 2008   Medical history non-contributory    Past Surgical History:  Procedure Laterality Date   IR ANGIOGRAM SELECTIVE EACH ADDITIONAL VESSEL  06/20/2019   IR ANGIOGRAM SELECTIVE EACH ADDITIONAL VESSEL  06/20/2019   IR ANGIOGRAM SELECTIVE EACH ADDITIONAL VESSEL  06/20/2019   IR ANGIOGRAM SELECTIVE EACH ADDITIONAL VESSEL  06/20/2019   IR ANGIOGRAM SELECTIVE EACH ADDITIONAL VESSEL  06/20/2019   IR ANGIOGRAM SELECTIVE EACH ADDITIONAL VESSEL  06/20/2019   IR ANGIOGRAM SELECTIVE EACH ADDITIONAL VESSEL  06/20/2019   IR ANGIOGRAM VISCERAL SELECTIVE  06/20/2019   IR EMBO ART  VEN HEMORR LYMPH EXTRAV  INC GUIDE ROADMAPPING  06/20/2019   IR [place] V[place] ACCESS RIGHT  06/20/2019   WISDOM TOOTH EXTRACTION  2013   no problems with anesthesia   Social History   Socioeconomic History   Marital status: Married    Spouse name: JBroadus John  Number of children: 0   Years of education: college   Highest education level: Not on file  Occupational History    Employer: Bartender    Comment: [employer]  Occupation: student    Comment: Nursing  [place]   Smoking status: Never   Smokeless tobacco: Never  Vaping Use   Vaping Use: Never used  Substance and Sexual Activity   Alcohol use: No   Drug use: No   Sexual activity: Yes    Partners: Male    Birth control/protection: I.U.D.  Other Topics Concern   Not on file  Social History Narrative   ** Merged History Encounter **       Social Determinants of Health   Financial Resource Strain: Not on file  Food Insecurity: Not on file  Transportation Needs: Not on file  Physical Activity: Not on file  Stress: Not on file  Social Connections: Not on file  Intimate Partner Violence: Not on file   No current facility-administered medications on file prior to encounter.   Current Outpatient Medications on File Prior to Encounter  Medication Sig Dispense Refill   acetaminophen (TYLENOL) 325 MG tablet Take 2 tablets (650 mg total) by mouth every 6 (six) hours as needed.     estradiol (VIVELLE-DOT) 0.1 MG/24HR patch Place 1 patch (0.1 mg total) onto the skin every 3 (three) days. 8 patch 3   progesterone 50 MG/ML injection Inject 1 mL (50 mg total) into the muscle daily. 30 mL 2   Choriogonadotropin Alfa 250 MCG/0.5ML injection Inject 1 syringe at exact time indicated as trigger 0.5 mL 1   EPINEPHrine 0.3 mg/0.3 mL IJ SOAJ injection INJECT 0.3 MG INTO THE MUSCLE AS NEEDED FOR  UP TO 4 DOSES FOR ANAPHYLAXIS. 4 each 1   estradiol (ESTRACE) 2 MG tablet Take 1 tablet (2 mg total) by mouth 2 (two) times daily. 60 tablet 2   estradiol (VIVELLE-DOT) 0.1 MG/24HR patch Place 2 patches (0.2 mg total) onto the skin every 3 (three) days. 16 patch 2   Follitropin Alfa 1050 units SOLR Inject 262 units subcutaneously daily as directed 2 each 1   Follitropin Alfa 900 UNIT/1.5ML SOPN Inject 300 Units into the skin daily as directed. 1.5 mL 1   Follitropin Alfa 900 UNIT/1.5ML SOPN Inject 100 units daily subcutaneously or as directed 1.5 mL 1   Ganirelix Acetate  (FYREMADEL) 250 MCG/0.5ML SOSY Inject 1 syringe under the skin daily as directed 2 mL 1   human chorionic gonadotropin (PREGNYL/NOVAREL) 10000 units injection Mix with 1 cc of diluent & inject at exact time given as directed 1 each 0   medroxyPROGESTERone (PROVERA) 10 MG tablet TAKE 1 TABLET BY MOUTH ONCE A DAY FOR 10 DAYS 10 tablet 6   medroxyPROGESTERone (PROVERA) 10 MG tablet Take 1 tablet (10 mg total) by mouth daily for 5 days. 5 tablet 0   medroxyPROGESTERone (PROVERA) 10 MG tablet Take 1 tablet (10 mg total) by mouth daily. 5 tablet 2   Menotropins 75 units SOLR Inject 75 Units into the skin daily. 10 each 1   metFORMIN (GLUCOPHAGE) 1000 MG tablet DAYS 1-3: 1/2 TAB BY MOUTH IN THE AM. DAYS 4-6: 1/2 IN THE AM AND 1/2 IN THE PM. DAYS 7-9: 1 IN AM AND 1/2 IN PM. DAY 10: 1 IN AM & 1 IN PM AND CONTINUE 60 tablet 11   methocarbamol (ROBAXIN) 750 MG tablet Take 1 tablet (750 mg total) by mouth every 6 (six) hours as needed for muscle spasms. 45 tablet 0   methylPREDNISolone (MEDROL) 4 MG tablet Take 2 tablets (8 mg total) by mouth 2 (two) times daily. 16 tablet 0   NEEDLE, DISP, 22 G 22G X 1" MISC Use as directed 35 each 1   NEEDLE, DISP, 27 G 27G X 1/2" MISC Use as directed 20 each 1   ondansetron (ZOFRAN-ODT) 4 MG disintegrating tablet Take 1 tablet (4 mg total) by mouth every 8 (eight) hours as needed for nausea or vomiting. 20 tablet 0   ondansetron (ZOFRAN-ODT) 4 MG disintegrating tablet Take 1 tablet (4 mg total) by mouth every 6 (six) hours as needed for nausea. 20 tablet 0   scopolamine (TRANSDERM-SCOP, 1.5 MG,) 1 MG/3DAYS Place 1 patch (1.5 mg total) onto the skin every 3 (three) days. 4 patch 0   SYRINGE-NEEDLE, DISP, 3 ML (B-D 3CC LUER-LOK SYR 18GX1-1/2) 18G X 1-1/2" 3 ML MISC use as directed 60 each 2   SYRINGE-NEEDLE, DISP, 3 ML 21G X 1-1/2" 3 ML MISC Use as directed 60 each 2   Allergies  Allergen Reactions   Shellfish Allergy Anaphylaxis   Shellfish Allergy Swelling    ROS:   Review of Systems  Constitutional:  Negative for chills, fatigue and fever.  Respiratory:  Negative for shortness of breath.   Cardiovascular:  Negative for chest pain.  Gastrointestinal:  Positive for abdominal pain. Negative for nausea and vomiting.  Genitourinary:  Positive for vaginal bleeding. Negative for difficulty urinating, dysuria, flank pain, pelvic pain, vaginal discharge and vaginal pain.  Neurological:  Negative for dizziness and headaches.  Psychiatric/Behavioral: Negative.      I have reviewed patient's Past Medical Hx, Surgical Hx, Family Hx, Social Hx, medications and allergies.  Physical Exam  Patient Vitals for the past 24 hrs:  BP Temp Temp src Pulse Resp SpO2 Height Weight  02/24/21 2351 116/77 -- -- (!) 106 -- -- -- --  02/24/21 2204 122/81 98.1 F (36.7 C) Oral 99 20 -- -- --  02/24/21 2149 139/79 98.2 F (36.8 C) Oral (!) 101 17 100 % 5' 2"  (1.575 m) 78 kg   Constitutional: Well-developed, well-nourished female in no acute distress.  Cardiovascular: normal rate Respiratory: normal effort GI: Abd soft, non-tender. Pos BS x 4 MS: Extremities nontender, no edema, normal ROM Neurologic: Alert and oriented x 4.  GU: Neg CVAT.  PELVIC EXAM: Cervix pink, visually closed, without lesion, scant dark blood, vaginal walls and external genitalia normal   LAB RESULTS No results found for this or any previous visit (from the past 24 hour(s)).     IMAGING US OB Comp Less 14 Wks  Addendum Date: 02/24/2021   ADDENDUM REPORT: 02/24/2021 23:33 ADDENDUM: Voice recognition error occurred on the original dictation. The fetal heart rate should read 165 bpm. Electronically Signed   By: Inez Catalina M.D.   On: 02/24/2021 23:33   Result Date: 02/24/2021 CLINICAL DATA:  Positive pregnancy test with vaginal bleeding pelvic pain, initial encounter EXAM: OBSTETRIC <14 WK ULTRASOUND TECHNIQUE: Transabdominal ultrasound was performed for evaluation of the gestation as well as  the maternal uterus and adnexal regions. COMPARISON:  None. FINDINGS: Intrauterine gestational sac: Present Yolk sac:  Present Embryo:  Present Cardiac Activity: Present Heart Rate: 65 bpm CRL:   22.3 mm   8 w 6 d                  [place] EDC: 09/20/2021 Subchorionic hemorrhage:  None visualized. Maternal uterus/adnexae: Corpus luteum cyst is noted on the right. Left ovary is not well visualized. No free fluid is noted. IMPRESSION: Single live intrauterine gestation at 8 weeks 6 days. No acute abnormality noted. Electronically Signed: By: Inez Catalina M.D. On: 02/24/2021 23:14    MAU Management/MDM: Orders Placed This Encounter  Procedures   US OB Comp Less 14 Wks   Discharge patient    No orders of the defined types were placed in this encounter.   US showing viable pregnancy, with normal fetal heartrate, without evidence of bleeding. Reassurance provided to pt.  Bleeding precautions/reasons to return to MAU reviewed. Pt to [facility] as planned.   ASSESSMENT 1. Normal IUP (intrauterine pregnancy) on prenatal ultrasound, first trimester   2. Vaginal bleeding in pregnancy, first trimester   3. Blood type, Rh positive   4. [redacted] weeks gestation of pregnancy   5. Pregnancy resulting from in vitro fertilization, first trimester     PLAN Discharge home with return precautions Allergies as of 02/24/2021       Reactions   Shellfish Allergy Anaphylaxis   Shellfish Allergy Swelling        Medication List     STOP taking these medications    Cetrotide 0.25 MG Kit Generic drug: Cetrorelix Acetate   doxycycline 100 MG tablet Commonly known as: VIBRA-TABS   ibuprofen 600 MG tablet Commonly known as: ADVIL   letrozole 2.5 MG tablet Commonly known as: FEMARA   leuprolide 1 MG/0.2ML injection Commonly known as: LUPRON       TAKE these medications    acetaminophen 325 MG tablet Commonly known as: TYLENOL Take 2 tablets (650 mg total) by mouth every 6  (six) hours as needed.   B-D  3CC LUER-LOK SYR 18GX1-1/2 18G X 1-1/2" 3 ML Misc Generic drug: SYRINGE-NEEDLE (DISP) 3 ML use as directed   B-D 3CC LUER-LOK SYR 21GX1-1/2 21G X 1-1/2" 3 ML Misc Generic drug: SYRINGE-NEEDLE (DISP) 3 ML Use as directed   B-D HYPODERMIC NEEDLE 22GX1" 22G X 1" Misc Generic drug: NEEDLE (DISP) 22 G Use as directed   BD Disp Needles 27G X 1/2" Misc Generic drug: NEEDLE (DISP) 27 G Use as directed   EPINEPHrine 0.3 mg/0.3 mL Soaj injection Commonly known as: EPI-PEN INJECT 0.3 MG INTO THE MUSCLE AS NEEDED FOR UP TO 4 DOSES FOR ANAPHYLAXIS.   estradiol 2 MG tablet Commonly known as: ESTRACE Take 1 tablet (2 mg total) by mouth 2 (two) times daily.   estradiol 0.1 MG/24HR patch Commonly known as: VIVELLE-DOT Place 1 patch (0.1 mg total) onto the skin every 3 (three) days.   estradiol 0.1 MG/24HR patch Commonly known as: Vivelle-Dot Place 2 patches (0.2 mg total) onto the skin every 3 (three) days.   Ganirelix Acetate 250 MCG/0.5ML Sosy Commonly known as: Fyremadel Inject 1 syringe under the skin daily as directed   Gonal-f 1050 units Solr Generic drug: Follitropin Alfa Inject 262 units subcutaneously daily as directed   Gonal-f RFF Rediject 900 UNIT/1.5ML Sopn Generic drug: Follitropin Alfa Inject 300 Units into the skin daily as directed.   Gonal-f RFF Rediject 900 UNIT/1.5ML Sopn Generic drug: Follitropin Alfa Inject 100 units daily subcutaneously or as directed   medroxyPROGESTERone 10 MG tablet Commonly known as: PROVERA TAKE 1 TABLET BY MOUTH ONCE A DAY FOR 10 DAYS   medroxyPROGESTERone 10 MG tablet Commonly known as: Provera Take 1 tablet (10 mg total) by mouth daily for 5 days.   medroxyPROGESTERone 10 MG tablet Commonly known as: PROVERA Take 1 tablet (10 mg total) by mouth daily.   Menopur 75 units Solr Generic drug: Menotropins Inject 75 Units into the skin daily.   metFORMIN 1000 MG tablet Commonly known as:  GLUCOPHAGE DAYS 1-3: 1/2 TAB BY MOUTH IN THE AM. DAYS 4-6: 1/2 IN THE AM AND 1/2 IN THE PM. DAYS 7-9: 1 IN AM AND 1/2 IN PM. DAY 10: 1 IN AM & 1 IN PM AND CONTINUE   methocarbamol 750 MG tablet Commonly known as: ROBAXIN Take 1 tablet (750 mg total) by mouth every 6 (six) hours as needed for muscle spasms.   methylPREDNISolone 4 MG tablet Commonly known as: MEDROL Take 2 tablets (8 mg total) by mouth 2 (two) times daily.   ondansetron 4 MG disintegrating tablet Commonly known as: ZOFRAN-ODT Take 1 tablet (4 mg total) by mouth every 8 (eight) hours as needed for nausea or vomiting.   ondansetron 4 MG disintegrating tablet Commonly known as: ZOFRAN-ODT Take 1 tablet (4 mg total) by mouth every 6 (six) hours as needed for nausea.   Ovidrel 250 MCG/0.5ML injection Generic drug: Choriogonadotropin Alfa Inject 1 syringe at exact time indicated as trigger   Pregnyl 10000 units injection Generic drug: human chorionic gonadotropin Mix with 1 cc of diluent & inject at exact time given as directed   progesterone 50 MG/ML injection Inject 1 mL (50 mg total) into the muscle daily.   scopolamine 1 MG/3DAYS Commonly known as: Transderm-Scop (1.5 MG) Place 1 patch (1.5 mg total) onto the skin every 3 (three) days.        Follow-up Information     Meisinger, Sherren Mocha, MD Follow up.   Specialty: Obstetrics and Gynecology Why: As scheduled Contact information: [place]  [address], [address] [place] [address] [phone]         [facility] Follow up.   Specialty: Obstetrics and Gynecology Why: As needed for emergencies Contact information: [address] [phone] [place] Certified Nurse-Midwife 02/25/2021  1:42 AM

## 2021-02-24 NOTE — MAU Note (Signed)
Received 30 y/o, G1P0 8 weeks, pt c/o vaginal bleeding x1 day but also reported that bleeding is minimal at this time, pt reports lower abdominal cramping which started today, 3/10 scale, safety maintained.

## 2021-02-24 NOTE — MAU Note (Addendum)
Samantha Bailey is a 30 y.o. at here in MAU reporting: finished IVF clinic yesterday signed off for 8.5 weeks-last night had sharp shooting pain with vaginal bleeding without cramping- then became cramping with bleeding and tissue like clumps of gray colored substance. Now still cramping and light bleeding has been brown to bright red in color. EDC: 10/01/2021  Pain score: 3 Vitals:   02/24/21 2149  BP: 139/79  Pulse: (!) 101  Resp: 17  Temp: 98.2 F (36.8 C)  SpO2: 100%

## 2021-03-01 ENCOUNTER — Other Ambulatory Visit (HOSPITAL_COMMUNITY): Payer: Self-pay

## 2021-03-01 MED ORDER — PROGESTERONE 200 MG PO CAPS
200.0000 mg | ORAL_CAPSULE | ORAL | 0 refills | Status: DC
  Filled 2021-03-01: qty 14, 14d supply, fill #0

## 2021-03-30 LAB — OB RESULTS CONSOLE GC/CHLAMYDIA
Chlamydia: NEGATIVE
Neisseria Gonorrhea: NEGATIVE

## 2021-03-30 LAB — OB RESULTS CONSOLE HEPATITIS B SURFACE ANTIGEN: Hepatitis B Surface Ag: NEGATIVE

## 2021-03-30 LAB — HEPATITIS C ANTIBODY: HCV Ab: NEGATIVE

## 2021-03-30 LAB — OB RESULTS CONSOLE ANTIBODY SCREEN: Antibody Screen: NEGATIVE

## 2021-03-30 LAB — OB RESULTS CONSOLE ABO/RH: RH Type: POSITIVE

## 2021-03-30 LAB — OB RESULTS CONSOLE HIV ANTIBODY (ROUTINE TESTING): HIV: NONREACTIVE

## 2021-03-30 LAB — OB RESULTS CONSOLE RUBELLA ANTIBODY, IGM: Rubella: IMMUNE

## 2021-03-30 LAB — OB RESULTS CONSOLE RPR: RPR: NONREACTIVE

## 2021-04-22 NOTE — L&D Delivery Note (Signed)
Delivery Note At 4:08 PM a viable female was delivered via Vaginal, Spontaneous (Presentation: Right Occiput Anterior).  APGAR: 8, 9; weight  pending.   Placenta status: Spontaneous, Intact.  Cord: 3 vessels with the following complications: None.  Cord pH:  not sent  Anesthesia: Epidural;Local Episiotomy: None Lacerations: 1st degree Suture Repair: 2.0 vicryl Est. Blood Loss (mL): 250   It's a boy - "Mosetta Putt"!!   Mom to postpartum.  Baby to Couplet care / Skin to Skin.  Ranae Pila 09/19/2021, 4:27 PM

## 2021-07-05 ENCOUNTER — Other Ambulatory Visit (HOSPITAL_COMMUNITY): Payer: Self-pay

## 2021-07-05 MED FILL — Epinephrine Solution Auto-injector 0.3 MG/0.3ML (1:1000): INTRAMUSCULAR | 30 days supply | Qty: 4 | Fill #0 | Status: CN

## 2021-07-18 ENCOUNTER — Other Ambulatory Visit: Payer: Self-pay | Admitting: Family Medicine

## 2021-07-18 ENCOUNTER — Other Ambulatory Visit (HOSPITAL_COMMUNITY): Payer: Self-pay

## 2021-07-18 DIAGNOSIS — Z91013 Allergy to seafood: Secondary | ICD-10-CM

## 2021-07-18 MED ORDER — EPINEPHRINE 0.3 MG/0.3ML IJ SOAJ
0.3000 mg | INTRAMUSCULAR | 1 refills | Status: DC
  Filled 2021-07-18: qty 4, 30d supply, fill #0

## 2021-07-19 ENCOUNTER — Other Ambulatory Visit (HOSPITAL_COMMUNITY): Payer: Self-pay

## 2021-08-14 ENCOUNTER — Other Ambulatory Visit (HOSPITAL_COMMUNITY): Payer: Self-pay

## 2021-08-14 MED ORDER — TRIAMCINOLONE ACETONIDE 0.1 % EX OINT
1.0000 "application " | TOPICAL_OINTMENT | Freq: Two times a day (BID) | CUTANEOUS | 0 refills | Status: DC
  Filled 2021-08-14: qty 45, 23d supply, fill #0

## 2021-08-16 ENCOUNTER — Other Ambulatory Visit (HOSPITAL_COMMUNITY): Payer: Self-pay

## 2021-08-16 MED ORDER — HYDROXYZINE HCL 25 MG PO TABS
25.0000 mg | ORAL_TABLET | Freq: Three times a day (TID) | ORAL | 1 refills | Status: DC | PRN
  Filled 2021-08-16: qty 30, 10d supply, fill #0

## 2021-08-27 ENCOUNTER — Other Ambulatory Visit (HOSPITAL_COMMUNITY): Payer: Self-pay

## 2021-08-27 MED ORDER — ONDANSETRON HCL 8 MG PO TABS
8.0000 mg | ORAL_TABLET | Freq: Two times a day (BID) | ORAL | 0 refills | Status: DC
  Filled 2021-08-27: qty 15, 8d supply, fill #0

## 2021-08-30 ENCOUNTER — Other Ambulatory Visit (HOSPITAL_COMMUNITY): Payer: Self-pay

## 2021-09-04 LAB — OB RESULTS CONSOLE GBS: GBS: POSITIVE

## 2021-09-07 ENCOUNTER — Encounter: Payer: Self-pay | Admitting: Family Medicine

## 2021-09-10 ENCOUNTER — Inpatient Hospital Stay (HOSPITAL_COMMUNITY)
Admission: AD | Admit: 2021-09-10 | Payer: No Typology Code available for payment source | Source: Home / Self Care | Admitting: Internal Medicine

## 2021-09-10 ENCOUNTER — Telehealth (HOSPITAL_COMMUNITY): Payer: Self-pay | Admitting: *Deleted

## 2021-09-10 DIAGNOSIS — Z22330 Carrier of Group B streptococcus: Secondary | ICD-10-CM | POA: Insufficient documentation

## 2021-09-10 NOTE — Telephone Encounter (Signed)
Preadmission screen  

## 2021-09-11 ENCOUNTER — Telehealth (HOSPITAL_COMMUNITY): Payer: Self-pay | Admitting: *Deleted

## 2021-09-11 ENCOUNTER — Encounter (HOSPITAL_COMMUNITY): Payer: Self-pay

## 2021-09-11 NOTE — Telephone Encounter (Signed)
Preadmission screen  

## 2021-09-13 ENCOUNTER — Encounter (HOSPITAL_COMMUNITY): Payer: Self-pay | Admitting: *Deleted

## 2021-09-13 ENCOUNTER — Telehealth (HOSPITAL_COMMUNITY): Payer: Self-pay | Admitting: *Deleted

## 2021-09-13 NOTE — Telephone Encounter (Signed)
Preadmission screen  

## 2021-09-18 ENCOUNTER — Encounter (HOSPITAL_COMMUNITY): Payer: Self-pay | Admitting: Obstetrics and Gynecology

## 2021-09-18 ENCOUNTER — Other Ambulatory Visit: Payer: Self-pay

## 2021-09-18 ENCOUNTER — Inpatient Hospital Stay (HOSPITAL_COMMUNITY)
Admission: AD | Admit: 2021-09-18 | Discharge: 2021-09-21 | DRG: 807 | Disposition: A | Discharge status: Home / Self Care | Payer: No Typology Code available for payment source | Attending: Obstetrics and Gynecology | Admitting: Obstetrics and Gynecology

## 2021-09-18 DIAGNOSIS — Z3A38 38 weeks gestation of pregnancy: Secondary | ICD-10-CM | POA: Diagnosis not present

## 2021-09-18 DIAGNOSIS — O99824 Streptococcus B carrier state complicating childbirth: Secondary | ICD-10-CM | POA: Diagnosis present

## 2021-09-18 DIAGNOSIS — O133 Gestational [pregnancy-induced] hypertension without significant proteinuria, third trimester: Secondary | ICD-10-CM | POA: Diagnosis not present

## 2021-09-18 DIAGNOSIS — O139 Gestational [pregnancy-induced] hypertension without significant proteinuria, unspecified trimester: Secondary | ICD-10-CM | POA: Diagnosis present

## 2021-09-18 DIAGNOSIS — O134 Gestational [pregnancy-induced] hypertension without significant proteinuria, complicating childbirth: Principal | ICD-10-CM | POA: Diagnosis present

## 2021-09-18 HISTORY — DX: Polycystic ovarian syndrome: E28.2

## 2021-09-18 LAB — CBC
HCT: 33.8 % — ABNORMAL LOW (ref 36.0–46.0)
Hemoglobin: 11.5 g/dL — ABNORMAL LOW (ref 12.0–15.0)
MCH: 28.7 pg (ref 26.0–34.0)
MCHC: 34 g/dL (ref 30.0–36.0)
MCV: 84.3 fL (ref 80.0–100.0)
Platelets: 379 10*3/uL (ref 150–400)
RBC: 4.01 MIL/uL (ref 3.87–5.11)
RDW: 13 % (ref 11.5–15.5)
WBC: 7.9 10*3/uL (ref 4.0–10.5)
nRBC: 0 % (ref 0.0–0.2)

## 2021-09-18 LAB — COMPREHENSIVE METABOLIC PANEL
ALT: 26 U/L (ref 0–44)
AST: 22 U/L (ref 15–41)
Albumin: 2.8 g/dL — ABNORMAL LOW (ref 3.5–5.0)
Alkaline Phosphatase: 126 U/L (ref 38–126)
Anion gap: 9 (ref 5–15)
BUN: 7 mg/dL (ref 6–20)
CO2: 20 mmol/L — ABNORMAL LOW (ref 22–32)
Calcium: 9 mg/dL (ref 8.9–10.3)
Chloride: 108 mmol/L (ref 98–111)
Creatinine, Ser: 0.53 mg/dL (ref 0.44–1.00)
GFR, Estimated: >60 mL/min (ref >60)
Glucose, Bld: 85 mg/dL (ref 70–99)
Potassium: 3.9 mmol/L (ref 3.5–5.1)
Sodium: 137 mmol/L (ref 135–145)
Total Bilirubin: 0.2 mg/dL — ABNORMAL LOW (ref 0.3–1.2)
Total Protein: 6.2 g/dL — ABNORMAL LOW (ref 6.5–8.1)

## 2021-09-18 LAB — PROTEIN / CREATININE RATIO, URINE
Creatinine, Urine: 211.21 mg/dL
Protein Creatinine Ratio: 0.09 mg/mg{Cre} (ref 0.00–0.15)
Total Protein, Urine: 19 mg/dL

## 2021-09-18 MED ORDER — HYDROXYZINE HCL 50 MG PO TABS: 50.0000 mg | ORAL_TABLET | Freq: Four times a day (QID) | ORAL | Status: DC | PRN

## 2021-09-18 MED ORDER — ONDANSETRON HCL 4 MG/2ML IJ SOLN
4.0000 mg | Freq: Four times a day (QID) | INTRAMUSCULAR | Status: DC | PRN
  Administered 2021-09-19: 4 mg via INTRAVENOUS
  Filled 2021-09-18: qty 2

## 2021-09-18 MED ORDER — LIDOCAINE HCL (PF) 1 % IJ SOLN
30.0000 mL | INTRAMUSCULAR | Status: AC | PRN
  Administered 2021-09-19: 30 mL via SUBCUTANEOUS
  Filled 2021-09-18: qty 30

## 2021-09-18 MED ORDER — PENICILLIN G POT IN DEXTROSE 60000 UNIT/ML IV SOLN
3.0000 10*6.[IU] | INTRAVENOUS | Status: DC
  Administered 2021-09-18 – 2021-09-19 (×4): 3 10*6.[IU] via INTRAVENOUS
  Filled 2021-09-18 (×4): qty 50

## 2021-09-18 MED ORDER — LACTATED RINGERS IV SOLN: INTRAVENOUS | Status: DC

## 2021-09-18 MED ORDER — OXYTOCIN BOLUS FROM INFUSION
333.0000 mL | Freq: Once | INTRAVENOUS | Status: AC
  Administered 2021-09-19: 333 mL via INTRAVENOUS

## 2021-09-18 MED ORDER — OXYTOCIN-SODIUM CHLORIDE 30-0.9 UT/500ML-% IV SOLN: 2.5000 [IU]/h | INTRAVENOUS | Status: DC

## 2021-09-18 MED ORDER — SODIUM CHLORIDE 0.9 % IV SOLN
5.0000 10*6.[IU] | Freq: Once | INTRAVENOUS | Status: AC
  Administered 2021-09-18: 5 10*6.[IU] via INTRAVENOUS
  Filled 2021-09-18: qty 5

## 2021-09-18 MED ORDER — SOD CITRATE-CITRIC ACID 500-334 MG/5ML PO SOLN
ORAL | Status: AC
  Filled 2021-09-18: qty 30

## 2021-09-18 MED ORDER — TERBUTALINE SULFATE 1 MG/ML IJ SOLN: 0.2500 mg | Freq: Once | INTRAMUSCULAR | Status: DC | PRN

## 2021-09-18 MED ORDER — LACTATED RINGERS IV SOLN: 500.0000 mL | INTRAVENOUS | Status: DC | PRN

## 2021-09-18 MED ORDER — SOD CITRATE-CITRIC ACID 500-334 MG/5ML PO SOLN
30.0000 mL | ORAL | Status: DC | PRN
  Administered 2021-09-18: 30 mL via ORAL

## 2021-09-18 MED ORDER — FENTANYL CITRATE (PF) 100 MCG/2ML IJ SOLN: 100.0000 ug | INTRAMUSCULAR | Status: DC | PRN

## 2021-09-18 MED ORDER — ACETAMINOPHEN 325 MG PO TABS
650.0000 mg | ORAL_TABLET | ORAL | Status: DC | PRN
  Administered 2021-09-19: 650 mg via ORAL
  Filled 2021-09-18: qty 2

## 2021-09-18 MED ORDER — OXYTOCIN-SODIUM CHLORIDE 30-0.9 UT/500ML-% IV SOLN
1.0000 m[IU]/min | INTRAVENOUS | Status: DC
  Administered 2021-09-18: 2 m[IU]/min via INTRAVENOUS
  Filled 2021-09-18: qty 500

## 2021-09-18 NOTE — MAU Provider Note (Signed)
History     CSN: [id number]  Arrival date and time: 09/18/21 1634   Event Date/Time   First Provider Initiated Contact with Patient 09/18/21 1748      Chief Complaint  Patient presents with   Hypertension   Headache   HPI  [name] is a 31 y.o. year old [id number] female at 2363w1d weeks gestation who presents to MAU reporting increasing BPs over the past 2 weeks of 130s/90s. She had a negative PEC work-up in the office last week. She had a BP of 145/90 this morning at 1000, 138/89 in the office today. She also reports a "nagging" H/A that goes across the top of her head; rated 3/10. Tylenol will usually relieve the H/A. She reports seeing floaters in her vision consistently for 3 days and increased swelling of face and both hands. She states, "My hands are so swollen, I find it hard to grip a pencil/pen." She denies VB or LOF. She reports goof (+) FM. [facility]; next appt is next week. Her spouse is present and contributing to the history taking.   OB History     Gravida  2   Para  0   Term  0   Preterm  0   AB  1   Living         SAB  1   IAB  0   Ectopic  0   Multiple      Live Births              Past Medical History:  Diagnosis Date   Allergic rhinitis    Chlamydia 2008   Medical history non-contributory    PCOS (polycystic ovarian syndrome)    Pregnancy induced hypertension     Past Surgical History:  Procedure Laterality Date   IR ANGIOGRAM SELECTIVE EACH ADDITIONAL VESSEL  06/20/2019   IR ANGIOGRAM SELECTIVE EACH ADDITIONAL VESSEL  06/20/2019   IR ANGIOGRAM SELECTIVE EACH ADDITIONAL VESSEL  06/20/2019   IR ANGIOGRAM SELECTIVE EACH ADDITIONAL VESSEL  06/20/2019   IR ANGIOGRAM SELECTIVE EACH ADDITIONAL VESSEL  06/20/2019   IR ANGIOGRAM SELECTIVE EACH ADDITIONAL VESSEL  06/20/2019   IR ANGIOGRAM SELECTIVE EACH ADDITIONAL VESSEL  06/20/2019   IR ANGIOGRAM VISCERAL SELECTIVE  06/20/2019   IR EMBO ART  VEN  HEMORR LYMPH EXTRAV  INC GUIDE ROADMAPPING  06/20/2019   IR US GUIDE VASC ACCESS RIGHT  06/20/2019   WISDOM TOOTH EXTRACTION  2013   no problems with anesthesia    Family History  Problem Relation Age of Onset   Healthy Mother    Hypertension Father    Healthy Maternal Grandmother    Healthy Maternal Grandfather    Healthy Paternal Grandmother    Healthy Paternal Grandfather    Breast cancer Neg Hx    Cancer - Colon Neg Hx    Cervical cancer Neg Hx    Colon cancer Neg Hx    Ovarian cancer Neg Hx     Social History   Tobacco Use   Smoking status: Never   Smokeless tobacco: Never  Vaping Use   Vaping Use: Never used  Substance Use Topics   Alcohol use: No   Drug use: No    Allergies:  Allergies  Allergen Reactions   Shellfish Allergy Anaphylaxis   Shellfish Allergy Swelling    Medications Prior to Admission  Medication Sig Dispense Refill Last Dose   acetaminophen (TYLENOL) 325 MG tablet Take 2 tablets (650  mg total) by mouth every 6 (six) hours as needed.   Past Week   hydrOXYzine (ATARAX) 25 MG tablet Take 1 tablet (25 mg total) by mouth 3 (three) times daily as needed. 30 tablet 1 Past Month   Choriogonadotropin Alfa 250 MCG/0.5ML injection Inject 1 syringe at exact time indicated as trigger 0.5 mL 1    EPINEPHrine 0.3 mg/0.3 mL IJ SOAJ injection INJECT 0.3 MG INTO THE MUSCLE AS NEEDED FOR UP TO 4 DOSES FOR ANAPHYLAXIS. 4 each 1    estradiol (ESTRACE) 2 MG tablet Take 1 tablet (2 mg total) by mouth 2 (two) times daily. 60 tablet 2    estradiol (VIVELLE-DOT) 0.1 MG/24HR patch Place 1 patch (0.1 mg total) onto the skin every 3 (three) days. 8 patch 3    estradiol (VIVELLE-DOT) 0.1 MG/24HR patch Place 2 patches (0.2 mg total) onto the skin every 3 (three) days. 16 patch 2    Follitropin Alfa 1050 units SOLR Inject 262 units subcutaneously daily as directed 2 each 1    Follitropin Alfa 900 UNIT/1.5ML SOPN Inject 300 Units into the skin daily as directed. 1.5 mL 1     Follitropin Alfa 900 UNIT/1.5ML SOPN Inject 100 units daily subcutaneously or as directed 1.5 mL 1    Ganirelix Acetate (FYREMADEL) 250 MCG/0.5ML SOSY Inject 1 syringe under the skin daily as directed 2 mL 1    human chorionic gonadotropin (PREGNYL/NOVAREL) 10000 units injection Mix with 1 cc of diluent & inject at exact time given as directed 1 each 0    medroxyPROGESTERone (PROVERA) 10 MG tablet TAKE 1 TABLET BY MOUTH ONCE A DAY FOR 10 DAYS 10 tablet 6    medroxyPROGESTERone (PROVERA) 10 MG tablet Take 1 tablet (10 mg total) by mouth daily for 5 days. 5 tablet 0    medroxyPROGESTERone (PROVERA) 10 MG tablet Take 1 tablet (10 mg total) by mouth daily. 5 tablet 2    Menotropins 75 units SOLR Inject 75 Units into the skin daily. 10 each 1    metFORMIN (GLUCOPHAGE) 1000 MG tablet DAYS 1-3: 1/2 TAB BY MOUTH IN THE AM. DAYS 4-6: 1/2 IN THE AM AND 1/2 IN THE PM. DAYS 7-9: 1 IN AM AND 1/2 IN PM. DAY 10: 1 IN AM & 1 IN PM AND CONTINUE 60 tablet 11    methocarbamol (ROBAXIN) 750 MG tablet Take 1 tablet (750 mg total) by mouth every 6 (six) hours as needed for muscle spasms. 45 tablet 0    methylPREDNISolone (MEDROL) 4 MG tablet Take 2 tablets (8 mg total) by mouth 2 (two) times daily. 16 tablet 0    NEEDLE, DISP, 22 G 22G X 1" MISC Use as directed 35 each 1    NEEDLE, DISP, 27 G 27G X 1/2" MISC Use as directed 20 each 1    ondansetron (ZOFRAN) 8 MG tablet Take 1 tablet (8 mg total) by mouth 2 (two) times daily. 15 tablet 0    ondansetron (ZOFRAN-ODT) 4 MG disintegrating tablet Take 1 tablet (4 mg total) by mouth every 8 (eight) hours as needed for nausea or vomiting. 20 tablet 0    ondansetron (ZOFRAN-ODT) 4 MG disintegrating tablet Take 1 tablet (4 mg total) by mouth every 6 (six) hours as needed for nausea. 20 tablet 0    progesterone (PROMETRIUM) 200 MG capsule Take 1 capsule (200 mg total) by mouth at bedtime 14 capsule 0    progesterone 50 MG/ML injection Inject 1 mL (50 mg total) into the muscle  daily.  30 mL 2    scopolamine (TRANSDERM-SCOP, 1.5 MG,) 1 MG/3DAYS Place 1 patch (1.5 mg total) onto the skin every 3 (three) days. 4 patch 0    SYRINGE-NEEDLE, DISP, 3 ML (B-D 3CC LUER-LOK SYR 18GX1-1/2) 18G X 1-1/2" 3 ML MISC use as directed 60 each 2    SYRINGE-NEEDLE, DISP, 3 ML 21G X 1-1/2" 3 ML MISC Use as directed 60 each 2    triamcinolone ointment (KENALOG) 0.1 % Apply 1 application topically 2 (two) times daily for 14 days 45 g 0     Review of Systems  Constitutional: Negative.   HENT: Negative.    Eyes:  Positive for visual disturbance (floaters in vision).  Respiratory: Negative.    Cardiovascular:  Positive for leg swelling (feet and legs swelling; not new).  Endocrine: Negative.   Genitourinary: Negative.   Musculoskeletal:        Hands swelling  Skin: Negative.   Allergic/Immunologic: Negative.   Neurological:  Positive for headaches.  Hematological: Negative.   Psychiatric/Behavioral: Negative.    Physical Exam  Patient Vitals for the past 24 hrs:  BP Temp Temp src Pulse Resp SpO2  09/18/21 1815 (!) 141/90 -- -- (!) 119 -- 99 %  09/18/21 1804 (!) 138/96 -- -- (!) 111 -- --  09/18/21 1745 123/83 -- -- (!) 114 -- 98 %  09/18/21 1730 123/84 -- -- (!) 105 -- 98 %  09/18/21 1715 133/82 -- -- (!) 109 -- 97 %  09/18/21 1710 -- 98.6 F (37 C) Oral -- -- --  09/18/21 1704 (!) 134/91 -- -- (!) 108 -- --  09/18/21 1655 -- -- -- -- -- 99 %  09/18/21 1654 (!) 142/91 -- -- (!) 109 17 --    Physical Exam Vitals and nursing note reviewed.  Constitutional:      Appearance: Normal appearance. She is obese.  HENT:     Head: Normocephalic and atraumatic.  Eyes:     Extraocular Movements: Extraocular movements intact.     Conjunctiva/sclera: Conjunctivae normal.     Pupils: Pupils are equal, round, and reactive to light.  Cardiovascular:     Rate and Rhythm: Normal rate.     Pulses: Normal pulses.  Pulmonary:     Effort: Pulmonary effort is normal.  Abdominal:     Palpations:  Abdomen is soft.  Genitourinary:    Comments: Not indicated Musculoskeletal:        General: Swelling present.     Comments: Hands and feet  Skin:    General: Skin is warm and dry.  Neurological:     Mental Status: She is alert and oriented to person, place, and time.  Psychiatric:        Mood and Affect: Mood normal.        Behavior: Behavior normal.        Thought Content: Thought content normal.        Judgment: Judgment normal.   REACTIVE NST - FHR: 140 bpm / moderate variability / accels present / decels absent / TOCO: regular every 3-4 mins   MAU Course  Procedures  Patient informed that the ultrasound is considered a limited OB ultrasound and is not intended to be a complete ultrasound exam.  Patient also informed that the ultrasound is not being completed with the intent of assessing for fetal or placental anomalies or any pelvic abnormalities.  Explained that the purpose of today's ultrasound is to assess for presentation.  Baby was found to  be in a cephalic presentation. Patient acknowledges the purpose of the exam and the limitations of the study.  MDM CCUA CBC CMP P/C Ratio Serial BP's   *Consult with Dr. Donavan Foil @ [phone] - notified of patient's complaints, assessments, and lab results, tx plan admit to L&D for IOL gHTN - agrees with plan   Results for orders placed or performed during the hospital encounter of 09/18/21 (from the past 24 hour(s))  CBC     Status: Abnormal   Collection Time: 09/18/21  4:44 PM  Result Value Ref Range   WBC 7.9 4.0 - 10.5 K/uL   RBC 4.01 3.87 - 5.11 MIL/uL   Hemoglobin 11.5 (L) 12.0 - 15.0 g/dL   HCT 29.5 (L) 18.8 - 41.6 %   MCV 84.3 80.0 - 100.0 fL   MCH 28.7 26.0 - 34.0 pg   MCHC 34.0 30.0 - 36.0 g/dL   RDW 60.6 30.1 - 60.1 %   Platelets 379 150 - 400 K/uL   nRBC 0.0 0.0 - 0.2 %  Comprehensive metabolic panel     Status: Abnormal   Collection Time: 09/18/21  4:44 PM  Result Value Ref Range   Sodium 137 135 - 145 mmol/L    Potassium 3.9 3.5 - 5.1 mmol/L   Chloride 108 98 - 111 mmol/L   CO2 20 (L) 22 - 32 mmol/L   Glucose, Bld 85 70 - 99 mg/dL   BUN 7 6 - 20 mg/dL   Creatinine, Ser 0.93 0.44 - 1.00 mg/dL   Calcium 9.0 8.9 - 23.5 mg/dL   Total Protein 6.2 (L) 6.5 - 8.1 g/dL   Albumin 2.8 (L) 3.5 - 5.0 g/dL   AST 22 15 - 41 U/L   ALT 26 0 - 44 U/L   Alkaline Phosphatase 126 38 - 126 U/L   Total Bilirubin 0.2 (L) 0.3 - 1.2 mg/dL   GFR, Estimated >57 >32 mL/min   Anion gap 9 5 - 15  Protein / creatinine ratio, urine     Status: None   Collection Time: 09/18/21  5:04 PM  Result Value Ref Range   Creatinine, Urine 211.21 mg/dL   Total Protein, Urine 19 mg/dL   Protein Creatinine Ratio 0.09 0.00 - 0.15 mg/mg[Cre]     Assessment and Plan  Gestational hypertension, third trimester - Admit to L&D for IOL - Routine L&D admission orders - See Dr. Vance Gather H&P documentation   [redacted] weeks gestation of pregnancy   Raelyn Mora, CNM 09/18/2021, 5:59 PM

## 2021-09-18 NOTE — MAU Note (Addendum)
Samantha Bailey is a 31 y.o. at [redacted]w[redacted]d here in MAU reporting: BP has been creaping. Last wk had work up, was neg. Higher today 145/90 in office.  Has been having nagging dull headaches last few days, Tylenol relieves. Started seeing floaters 3 days ago. Hands have become very tight and swollen.  Denies leaking, has had a little spotting after exam in office today was 2/60. Baby moving.  Onset of complaint: over a wk Pain score: 3 Vitals:   09/18/21 1704 09/18/21 1710  BP: (!) 134/91   Pulse: (!) 108   Resp:    Temp:  98.6 F (37 C)  SpO2:       FHT:150 Lab orders placed from triage:

## 2021-09-18 NOTE — H&P (Signed)
Samantha Bailey is a 31 y.o. G2P0010 female at [redacted]w[redacted]d weeks gestation who presents to MAU reporting increasing BPs over the past 2 weeks of 130s/90s. She had a negative PEC work-up in the office last week. She had a BP of 145/90 this morning at 1000, 138/89 in the office today. She also reports a "nagging" H/A that goes across the top of her head; rated 3/10. Tylenol will usually relieve the H/A. She reports seeing floaters in her vision consistently for 3 days and increased swelling of face and both hands. She states, "My hands are so swollen, I find it hard to grip a pencil/pen." She denies VB or LOF. She reports goof (+) FM. She receives [facility]; next appt is next week. Her spouse is present and contributing to the history taking. GBS + OB History     Gravida  2   Para  0   Term  0   Preterm  0   AB  1   Living         SAB  1   IAB  0   Ectopic  0   Multiple      Live Births             Past Medical History:  Diagnosis Date   Allergic rhinitis    Chlamydia 2008   Medical history non-contributory    PCOS (polycystic ovarian syndrome)    Pregnancy induced hypertension    Past Surgical History:  Procedure Laterality Date   IR ANGIOGRAM SELECTIVE EACH ADDITIONAL VESSEL  06/20/2019   IR ANGIOGRAM SELECTIVE EACH ADDITIONAL VESSEL  06/20/2019   IR ANGIOGRAM SELECTIVE EACH ADDITIONAL VESSEL  06/20/2019   IR ANGIOGRAM SELECTIVE EACH ADDITIONAL VESSEL  06/20/2019   IR ANGIOGRAM SELECTIVE EACH ADDITIONAL VESSEL  06/20/2019   IR ANGIOGRAM SELECTIVE EACH ADDITIONAL VESSEL  06/20/2019   IR ANGIOGRAM SELECTIVE EACH ADDITIONAL VESSEL  06/20/2019   IR ANGIOGRAM VISCERAL SELECTIVE  06/20/2019   IR EMBO ART  VEN HEMORR LYMPH EXTRAV  INC GUIDE ROADMAPPING  06/20/2019   IR US GUIDE VASC ACCESS RIGHT  06/20/2019   WISDOM TOOTH EXTRACTION  2013   no problems with anesthesia   Family History: family history includes Healthy in her maternal grandfather, maternal  grandmother, mother, paternal grandfather, and paternal grandmother; Hypertension in her father. Social History:  reports that she has never smoked. She has never used smokeless tobacco. She reports that she does not drink alcohol and does not use drugs.     Maternal Diabetes: No Genetic Screening: Normal Maternal Ultrasounds/Referrals: Normal Fetal Ultrasounds or other Referrals:  None Maternal Substance Abuse:  No Significant Maternal Medications:  None Significant Maternal Lab Results:  Group B Strep positive Other Comments:  None  Review of Systems History Dilation: 2.5 Effacement (%): 60 Station: -3 Exam by:: [employer] RN Blood pressure 124/79, pulse (!) 106, temperature 98.9 F (37.2 C), temperature source Oral, resp. rate 18, SpO2 99 %, unknown if currently breastfeeding. Exam Physical Exam  Vitals and nursing note reviewed. Exam conducted with a chaperone present.  Constitutional:      Appearance: Normal appearance.  HENT:     Head: Normocephalic.  Eyes:     Pupils: Pupils are equal, round, and reactive to light.  Cardiovascular:     Rate and Rhythm: Normal rate and regular rhythm.     Pulses: Normal pulses.  Abdominal:     General: Abdomen is Gravid, nontender Neurological:  Mental Status: She is alert.  Prenatal labs: ABO, Rh: A/Positive/-- (12/09 0000) Antibody: Negative (12/09 0000) Rubella: Immune (12/09 0000) RPR: Nonreactive (12/09 0000)  HBsAg: Negative (12/09 0000)  HIV: Non-reactive (12/09 0000)  GBS: Positive/-- (05/16 0000)   Assessment/Plan: IUP at 38 weeks with GHTN  without severe features. Recommended IOL.  Since ctxs are freq plan Pitocin GBS positive - abx in labor BPs normal now and labs also normal.   Turner Daniels 09/18/2021, 9:54 PM

## 2021-09-19 ENCOUNTER — Inpatient Hospital Stay (HOSPITAL_COMMUNITY): Payer: No Typology Code available for payment source | Admitting: Anesthesiology

## 2021-09-19 ENCOUNTER — Encounter (HOSPITAL_COMMUNITY): Payer: Self-pay | Admitting: Obstetrics and Gynecology

## 2021-09-19 LAB — CBC
HCT: 31.5 % — ABNORMAL LOW (ref 36.0–46.0)
Hemoglobin: 10.6 g/dL — ABNORMAL LOW (ref 12.0–15.0)
MCH: 28.3 pg (ref 26.0–34.0)
MCHC: 33.7 g/dL (ref 30.0–36.0)
MCV: 84.2 fL (ref 80.0–100.0)
Platelets: 344 10*3/uL (ref 150–400)
RBC: 3.74 MIL/uL — ABNORMAL LOW (ref 3.87–5.11)
RDW: 13 % (ref 11.5–15.5)
WBC: 10.3 10*3/uL (ref 4.0–10.5)
nRBC: 0 % (ref 0.0–0.2)

## 2021-09-19 LAB — RPR: RPR Ser Ql: NONREACTIVE

## 2021-09-19 MED ORDER — SENNOSIDES-DOCUSATE SODIUM 8.6-50 MG PO TABS
2.0000 | ORAL_TABLET | ORAL | Status: DC
  Administered 2021-09-19 – 2021-09-20 (×2): 2 via ORAL
  Filled 2021-09-19 (×2): qty 2

## 2021-09-19 MED ORDER — OXYCODONE HCL 5 MG PO TABS: 5.0000 mg | ORAL_TABLET | ORAL | Status: DC | PRN

## 2021-09-19 MED ORDER — DIPHENHYDRAMINE HCL 25 MG PO CAPS: 25.0000 mg | ORAL_CAPSULE | Freq: Four times a day (QID) | ORAL | Status: DC | PRN

## 2021-09-19 MED ORDER — IBUPROFEN 600 MG PO TABS
600.0000 mg | ORAL_TABLET | Freq: Four times a day (QID) | ORAL | Status: DC
  Administered 2021-09-19 – 2021-09-21 (×8): 600 mg via ORAL
  Filled 2021-09-19 (×8): qty 1

## 2021-09-19 MED ORDER — LACTATED RINGERS IV SOLN: 500.0000 mL | Freq: Once | INTRAVENOUS | Status: DC

## 2021-09-19 MED ORDER — LIDOCAINE HCL (PF) 1 % IJ SOLN
INTRAMUSCULAR | Status: DC | PRN
  Administered 2021-09-19 (×2): 5 mL via EPIDURAL

## 2021-09-19 MED ORDER — PHENYLEPHRINE 80 MCG/ML (10ML) SYRINGE FOR IV PUSH (FOR BLOOD PRESSURE SUPPORT): 80.0000 ug | PREFILLED_SYRINGE | INTRAVENOUS | Status: DC | PRN

## 2021-09-19 MED ORDER — PRENATAL MULTIVITAMIN CH
1.0000 | ORAL_TABLET | Freq: Every day | ORAL | Status: DC
  Administered 2021-09-20 – 2021-09-21 (×2): 1 via ORAL
  Filled 2021-09-19 (×2): qty 1

## 2021-09-19 MED ORDER — WITCH HAZEL-GLYCERIN EX PADS: 1.0000 "application " | MEDICATED_PAD | CUTANEOUS | Status: DC | PRN

## 2021-09-19 MED ORDER — ZOLPIDEM TARTRATE 5 MG PO TABS: 5.0000 mg | ORAL_TABLET | Freq: Every evening | ORAL | Status: DC | PRN

## 2021-09-19 MED ORDER — ONDANSETRON HCL 40 MG/20ML IJ SOLN
8.0000 mg | Freq: Once | INTRAMUSCULAR | Status: AC
  Administered 2021-09-19: 8 mg via INTRAVENOUS
  Filled 2021-09-19: qty 4

## 2021-09-19 MED ORDER — EPHEDRINE 5 MG/ML INJ: 10.0000 mg | INTRAVENOUS | Status: DC | PRN

## 2021-09-19 MED ORDER — FENTANYL-BUPIVACAINE-NACL 0.5-0.125-0.9 MG/250ML-% EP SOLN
12.0000 mL/h | EPIDURAL | Status: DC | PRN
  Administered 2021-09-19: 11 mL/h via EPIDURAL
  Filled 2021-09-19: qty 250

## 2021-09-19 MED ORDER — TETANUS-DIPHTH-ACELL PERTUSSIS 5-2.5-18.5 LF-MCG/0.5 IM SUSY: 0.5000 mL | PREFILLED_SYRINGE | Freq: Once | INTRAMUSCULAR | Status: DC

## 2021-09-19 MED ORDER — BENZOCAINE-MENTHOL 20-0.5 % EX AERO
1.0000 "application " | INHALATION_SPRAY | CUTANEOUS | Status: DC | PRN
  Administered 2021-09-20: 1 via TOPICAL
  Filled 2021-09-19 (×2): qty 56

## 2021-09-19 MED ORDER — ONDANSETRON HCL 4 MG/2ML IJ SOLN: 4.0000 mg | INTRAMUSCULAR | Status: DC | PRN

## 2021-09-19 MED ORDER — DIPHENHYDRAMINE HCL 50 MG/ML IJ SOLN
12.5000 mg | INTRAMUSCULAR | Status: AC | PRN
  Administered 2021-09-19 (×3): 12.5 mg via INTRAVENOUS
  Filled 2021-09-19 (×2): qty 1

## 2021-09-19 MED ORDER — SIMETHICONE 80 MG PO CHEW: 80.0000 mg | CHEWABLE_TABLET | ORAL | Status: DC | PRN

## 2021-09-19 MED ORDER — ACETAMINOPHEN 325 MG PO TABS
650.0000 mg | ORAL_TABLET | ORAL | Status: DC | PRN
  Administered 2021-09-20 (×2): 650 mg via ORAL
  Filled 2021-09-19 (×2): qty 2

## 2021-09-19 MED ORDER — OXYCODONE HCL 5 MG PO TABS: 10.0000 mg | ORAL_TABLET | ORAL | Status: DC | PRN

## 2021-09-19 MED ORDER — COCONUT OIL OIL: 1.0000 "application " | TOPICAL_OIL | Status: DC | PRN

## 2021-09-19 MED ORDER — ONDANSETRON HCL 4 MG PO TABS: 4.0000 mg | ORAL_TABLET | ORAL | Status: DC | PRN

## 2021-09-19 MED ORDER — DIBUCAINE (PERIANAL) 1 % EX OINT: 1.0000 "application " | TOPICAL_OINTMENT | CUTANEOUS | Status: DC | PRN

## 2021-09-19 NOTE — Progress Notes (Signed)
Prolonged decel x 2 min resolved with position change. Pitocin at 20. Now cat 1 tracing. Sve 7/c/0.

## 2021-09-19 NOTE — Lactation Note (Addendum)
This note was copied from a baby's chart. Lactation Consultation Note  Patient Name: Samantha Bailey [id number] Date: 09/19/2021 Reason for consult: Follow-up assessment;Mother's request;Difficult latch;1st time breastfeeding;Early term 37-38.6wks Age:29 hours, ETI female infant, see mom's MR, Hx PCOS. Mom is Samantha Bailey and she will inform [facility] staff of what DEBP she chooses, DEBP choice sheet given to mom.  Per mom, infant has not been latching on MBU, she was taught hand expression by RN, so she has been hand expressing and giving infant back her EBM by spoon.  LC entered the room infant was cuing to breastfeed, mom was open to latching infant at the breast.  Mom latched infant on her left breast using the football hold position, infant was on and off breast initially, but after 8 minutes infant started sustaining the latch and was still breastfeeding after 14 minutes when LC left the room.  Mom will continue to breastfeed infant on demand, by cues, 8 to 12+ times within 24 hours, skin to skin. Mom may try latch infant on both breast during a feeding if infant is still cuing to breastfeed. Mom knows to call RN/LC for further latch assistance if needed.  Mom made aware of O/P services, breastfeeding support groups, community resources, and our phone # for post-discharge questions.   Maternal Data Has patient been taught Hand Expression?: Yes Does the patient have breastfeeding experience prior to this delivery?: No  Feeding Mother's Current Feeding Choice: Breast Milk  LATCH Score Latch: Repeated attempts needed to sustain latch, nipple held in mouth throughout feeding, stimulation needed to elicit sucking reflex.  Audible Swallowing: A few with stimulation  Type of Nipple: Everted at rest and after stimulation  Comfort (Breast/Nipple): Soft / non-tender  Hold (Positioning): Assistance needed to correctly position infant at breast and maintain latch.  LATCH Score:  7   Lactation Tools Discussed/Used    Interventions Interventions: Breast feeding basics reviewed;Assisted with latch;Skin to skin;Breast compression;Adjust position;Support pillows;Position options;Education;LC Services brochure;DEBP  Discharge Pump: Employee Pump (Mom will let LC services know which DEBP she would like Breast pump sheet choices given.) WIC Program: No  Consult Status Consult Status: Follow-up Date: 09/20/21 Follow-up type: In-patient    Samantha Bailey 09/19/2021, 10:11 PM

## 2021-09-19 NOTE — Lactation Note (Signed)
This note was copied from a baby's chart. Lactation Consultation Note  Patient Name: Samantha Bailey [id number] Date: 09/19/2021 Reason for consult: L&D Initial assessment;Primapara;1st time breastfeeding;Early term 37-38.6wks;Breastfeeding assistance Age:29 hours  P1, Early Term, Female Infant  LC entered the room and baby was STS with mom. Per the RN she had attempted to assist mom with latching baby in the cross-cradle position on the left breast, but he was biting down. Baby was showing feeding cues. LC assisted mom with latching baby in the left breast. Baby was biting down on the breast and coming off. LC sandwiched the breast and assisted baby in latching again. Baby did a few sucks before coming off. LC switched baby to a football position on the left breast. Baby sucked for 10 sucks before coming off. LC assisted mom with sandwiching the breast and showed her how to position her hands in a U shape to assist with getting baby on the breast. Baby latched himself and sucked for another 10 sucks before taking himself off.   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 LC let mom know that we can assist her with that when she is on the [facility] prior to her being discharged.   Mom is aware that she will be seen on the [facility].   Maternal Data Does the patient have breastfeeding experience prior to this delivery?: No  Feeding Mother's Current Feeding Choice: Breast Milk  LATCH Score Latch: Repeated attempts needed to sustain latch, nipple held in mouth throughout feeding, stimulation needed to elicit sucking reflex.  Audible Swallowing: A few with stimulation  Type of Nipple: Everted at rest and after stimulation  Comfort (Breast/Nipple): Soft / non-tender  Hold (Positioning): Assistance needed to correctly position infant at breast and maintain latch.  LATCH Score: 7   Lactation Tools Discussed/Used    Interventions Interventions: Assisted with latch;Breast  compression;Support pillows  Discharge    Consult Status Consult Status: Follow-up from L&D Date: 09/19/21 Follow-up type: In-patient    Delene Loll 09/19/2021, 4:53 PM

## 2021-09-19 NOTE — Anesthesia Preprocedure Evaluation (Signed)
Anesthesia Evaluation  Patient identified by MRN, date of birth, ID band Patient awake    Reviewed: Allergy & Precautions, Patient's Chart, lab work & pertinent test results  Airway Mallampati: II  TM Distance: >3 FB Neck ROM: Full    Dental  (+) Teeth Intact, Dental Advisory Given   Pulmonary neg pulmonary ROS,    Pulmonary exam normal breath sounds clear to auscultation       Cardiovascular hypertension, Normal cardiovascular exam Rhythm:Regular Rate:Normal  Gestaional - not on any Rx   Neuro/Psych PSYCHIATRIC DISORDERS Anxiety negative neurological ROS     GI/Hepatic Neg liver ROS, GERD  ,  Endo/Other  Obesity PCOS  Renal/GU negative Renal ROS  negative genitourinary   Musculoskeletal negative musculoskeletal ROS (+)   Abdominal (+) + obese,   Peds  Hematology  (+) Blood dyscrasia, anemia ,   Anesthesia Other Findings   Reproductive/Obstetrics (+) Pregnancy                             Anesthesia Physical Anesthesia Plan  ASA: 2  Anesthesia Plan: Epidural   Post-op Pain Management:    Induction:   PONV Risk Score and Plan:   Airway Management Planned: Natural Airway  Additional Equipment:   Intra-op Plan:   Post-operative Plan:   Informed Consent: I have reviewed the patients History and Physical, chart, labs and discussed the procedure including the risks, benefits and alternatives for the proposed anesthesia with the patient or authorized representative who has indicated his/her understanding and acceptance.       Plan Discussed with: Anesthesiologist  Anesthesia Plan Comments:         Anesthesia Quick Evaluation

## 2021-09-19 NOTE — Anesthesia Procedure Notes (Signed)
Epidural Patient location during procedure: OB Start time: 09/19/2021 7:48 AM End time: 09/19/2021 7:55 AM  Staffing Anesthesiologist: Mal Amabile, MD Performed: anesthesiologist   Preanesthetic Checklist Completed: patient identified, IV checked, site marked, risks and benefits discussed, surgical consent, monitors and equipment checked, pre-op evaluation and timeout performed  Epidural Patient position: sitting Prep: DuraPrep and site prepped and draped Patient monitoring: continuous pulse ox and blood pressure Approach: midline Location: L3-L4 Injection technique: LOR air  Needle:  Needle type: Tuohy  Needle gauge: 17 G Needle length: 9 cm and 9 Needle insertion depth: 5 cm Catheter type: closed end flexible Catheter size: 19 Gauge Catheter at skin depth: 10 cm Test dose: negative and Other  Assessment Events: blood not aspirated, injection not painful, no injection resistance, no paresthesia and negative IV test  Additional Notes Patient identified. Risks and benefits discussed including failed block, incomplete  Pain control, post dural puncture headache, nerve damage, paralysis, blood pressure Changes, nausea, vomiting, reactions to medications-both toxic and allergic and post Partum back pain. All questions were answered. Patient expressed understanding and wished to proceed. Sterile technique was used throughout procedure. Epidural site was Dressed with sterile barrier dressing. No paresthesias, signs of intravascular injection Or signs of intrathecal spread were encountered.  Patient was more comfortable after the epidural was dosed. Please see RN's note for documentation of vital signs and FHR which are stable.

## 2021-09-19 NOTE — Progress Notes (Signed)
Comf with CLE. BP normal to mild range.  PIH labs WNL. SVE 5/80/-2, AROM clear fluid. Continue pitocin titration. PCN for GBS positive.    Rosie Fate MD

## 2021-09-20 LAB — CBC
HCT: 32.7 % — ABNORMAL LOW (ref 36.0–46.0)
Hemoglobin: 11.1 g/dL — ABNORMAL LOW (ref 12.0–15.0)
MCH: 28.4 pg (ref 26.0–34.0)
MCHC: 33.9 g/dL (ref 30.0–36.0)
MCV: 83.6 fL (ref 80.0–100.0)
Platelets: 357 10*3/uL (ref 150–400)
RBC: 3.91 MIL/uL (ref 3.87–5.11)
RDW: 13.2 % (ref 11.5–15.5)
WBC: 14.3 10*3/uL — ABNORMAL HIGH (ref 4.0–10.5)
nRBC: 0 % (ref 0.0–0.2)

## 2021-09-20 NOTE — Anesthesia Postprocedure Evaluation (Signed)
Anesthesia Post Note  Patient: Samantha Bailey [facility]  Procedure(s) Performed: AN AD HOC LABOR EPIDURAL     Patient location during evaluation: Mother Baby Anesthesia Type: Epidural Level of consciousness: awake, oriented and awake and alert Pain management: pain level controlled Vital Signs Assessment: post-procedure vital signs reviewed and stable Respiratory status: spontaneous breathing, nonlabored ventilation and respiratory function stable Cardiovascular status: stable Postop Assessment: no headache, patient able to bend at knees, no apparent nausea or vomiting, adequate PO intake and able to ambulate Anesthetic complications: no   No notable events documented.  Last Vitals:  Vitals:   09/20/21 0015 09/20/21 0415  BP: 103/69 113/79  Pulse: 97 86  Resp: 16 16  Temp: (!) 36.4 C 36.5 C  SpO2:      Last Pain:  Vitals:   09/20/21 0623  TempSrc:   PainSc: 6    Pain Goal:                   Brezlyn Manrique

## 2021-09-20 NOTE — Lactation Note (Addendum)
This note was copied from a baby's chart. Lactation Consultation Note  Patient Name: Boy Courtlynn Holloman IRWER'X Date: 09/20/2021  Age:29 hours LC gave mom her [employer] Employee DEBP, mom chose the [employer] .  LC explained how to use Sonata DEBP, and gave mom a belly band to make hands free pumping, mom was pumping when LC left room and understands to stop pumping at 15 minutes.  Mom knows to call [facility] services if she has questions or concerns.  Maternal Data Has patient been taught Hand Expression?: Yes Does the patient have breastfeeding experience prior to this delivery?: No  Feeding Mother's Current Feeding Choice: Breast Milk  LATCH Score   Lactation Tools Discussed/Used    Interventions Interventions: Skin to skin;Assisted with latch;Breast compression;Adjust position;Support pillows;Position options;Expressed milk;Hand express;Coconut oil;Education  Discharge    Consult Status Consult Status: Follow-up Date: 09/21/21 Follow-up type: In-patient    Danelle Earthly 09/20/2021, 5:38 PM

## 2021-09-20 NOTE — Progress Notes (Signed)
The pediatric NP re examined baby and thought the ventral penile raphe was less torsed and maybe circumcision is OK I examined baby and there is a 90 degree torsion of distal raphe. I did not circumcise the baby. I D/W patient explaining my concerns. She will FU with pediatric urology per pediatrician. She states she understood and agreed.

## 2021-09-20 NOTE — Lactation Note (Signed)
This note was copied from a baby's chart. Lactation Consultation Note  Patient Name: Samantha Bailey [id number] Date: 09/20/2021 Reason for consult: Follow-up assessment;Mother's request;Difficult latch;Early term 37-38.6wks;1st time breastfeeding;Maternal endocrine disorder (Infant has no been actively feeding at breast, sleepy. Infant with -3% weight loss.) Age:29 hours  Mom is a [employer] Employee she will call [facility] [employer], phone number given  and ask if they will take her insurance, she would like a Free Style  Flex which is out of stock, if not available she will think about receiving the Sonata DEBP from [facility].  Per mom, infant has been sleepy and BF for short intervals today 5 minutes or less , mom has been supplementing with her EBM  using a curve tip syringe, she has been using the hand pump, and  can pump 10 mls in 5 minutes.  Mom briefly self expressed 2 mls of colostrum that was spoon fed to infant, prior to latching infant at the breast, infant became more alert. Mom latched infant on her left breast using the football hold position, infant latched with depth, mom did stimulation techniques such as breast compressions, talking to infant and gently rub infant's neck and shoulder. Infant breastfeed for 27 minutes, but mom did have small suck blister at the tip of her nipple. Mom will continue to work with [facility] services on infant having a deeper  latch, mom knows to call [facility] for further latch assistance for next feeding if needed.  Mom has coconut oil to apply to sore nipples. Mom knows if she desires after latching infant at breast, she can continue to supplement infant with her EBM that she pumped.  Maternal Data Has patient been taught Hand Expression?: Yes Does the patient have breastfeeding experience prior to this delivery?: No  Feeding Mother's Current Feeding Choice: Breast Milk  LATCH Score Latch: Grasps breast easily, tongue down, lips flanged, rhythmical  sucking.  Audible Swallowing: A few with stimulation  Type of Nipple: Everted at rest and after stimulation  Comfort (Breast/Nipple): Filling, red/small blisters or bruises, mild/mod discomfort  Hold (Positioning): Assistance needed to correctly position infant at breast and maintain latch.  LATCH Score: 7   Lactation Tools Discussed/Used    Interventions Interventions: Skin to skin;Assisted with latch;Breast compression;Adjust position;Support pillows;Position options;Expressed milk;Hand express;Coconut oil;Education  Discharge    Consult Status Consult Status: Follow-up Date: 09/21/21 Follow-up type: In-patient    Danelle Earthly 09/20/2021, 4:14 PM

## 2021-09-20 NOTE — Progress Notes (Signed)
Post Partum Day 1 Subjective: no complaints, up ad lib, voiding, tolerating PO, and + flatus  Objective: Blood pressure 113/79, pulse 86, temperature 97.7 F (36.5 C), temperature source Oral, resp. rate 16, height 5\' 2"  (1.575 m), weight 94.8 kg, SpO2 98 %, unknown if currently breastfeeding.  Physical Exam:  General: alert and cooperative Lochia: appropriate Uterine Fundus: firm Incision: healing well DVT Evaluation: No evidence of DVT seen on physical exam.  Recent Labs    09/19/21 0626 09/20/21 0439  HGB 10.6* 11.1*  HCT 31.5* 32.7*    Assessment/Plan: Plan for discharge tomorrow Patient still trying to establish breast  feeding. Pediatrician concerned for possible torsion of raphe of baby's penis. Will hold circumcision.  LOS: 2 days   11/20/21 II 09/20/2021, 7:44 AM

## 2021-09-21 ENCOUNTER — Other Ambulatory Visit (HOSPITAL_COMMUNITY): Payer: Self-pay

## 2021-09-21 MED ORDER — IBUPROFEN 600 MG PO TABS
600.0000 mg | ORAL_TABLET | Freq: Four times a day (QID) | ORAL | 0 refills | Status: DC
  Filled 2021-09-21: qty 30, 8d supply, fill #0

## 2021-09-21 NOTE — Progress Notes (Signed)
Post Partum Day 2 Subjective: no complaints, up ad lib, voiding, and tolerating PO  Objective: Blood pressure 107/78, pulse 83, temperature 98 F (36.7 C), temperature source Oral, resp. rate 18, height 5\' 2"  (1.575 m), weight 94.8 kg, SpO2 100 %, unknown if currently breastfeeding.  Physical Exam:  General: alert, cooperative, and appears stated age 31: appropriate Uterine Fundus: firm Incision: healing well, no significant drainage, no dehiscence DVT Evaluation: No evidence of DVT seen on physical exam. Negative Homan's sign. No cords or calf tenderness.  Recent Labs    09/19/21 0626 09/20/21 0439  HGB 10.6* 11.1*  HCT 31.5* 32.7*    Assessment/Plan: Discharge home and Breastfeeding   LOS: 3 days   11/20/21 09/21/2021, 9:06 AM

## 2021-09-21 NOTE — Discharge Summary (Signed)
Postpartum Discharge Summary    Patient Name: Samantha Bailey [facility] DOB: 04/28/1990 MRN: [id number]  Date of admission: 09/18/2021 Delivery date:09/19/2021  Delivering provider: Tyson Dense  Date of discharge: 09/21/2021  Admitting diagnosis: Gestational hypertension [O13.9] Intrauterine pregnancy: [redacted]w[redacted]d    Secondary diagnosis:  Principal Problem:   Gestational hypertension  Additional problems: none    Discharge diagnosis: Term Pregnancy Delivered and Gestational Hypertension                                              Post partum procedures: none Augmentation: AROM and Pitocin Complications: None  Hospital course: Induction of Labor With Vaginal Delivery   31y.o. yo G2P1011 at 366w2das admitted to the hospital 09/18/2021 for induction of labor.  Indication for induction: Gestational hypertension.  Patient had an uncomplicated labor course as follows: Membrane Rupture Time/Date: 8:27 AM ,09/19/2021   Delivery Method:Vaginal, Spontaneous  Episiotomy: None  Lacerations:  1st degree;Perineal  Details of delivery can be found in separate delivery note.  Patient had a routine postpartum course. Patient is discharged home 09/21/21.  Newborn Data: Birth date:09/19/2021  Birth time:4:08 PM  Gender:Female  Living status:Living  Apgars:8 ,9  Weight:3300 g   Magnesium Sulfate received: No BMZ received: No Rhophylac:No MMR:No T-DaP:Given prenatally Flu: No Transfusion:No  Physical exam  Vitals:   09/20/21 0919 09/20/21 1330 09/20/21 2105 09/21/21 0601  BP: 110/66 127/82 112/66 107/78  Pulse: 88 89 93 83  Resp: 18 18 18 18   Temp: 97.8 F (36.6 C) 98 F (36.7 C) 98.2 F (36.8 C) 98 F (36.7 C)  TempSrc: Oral Oral Axillary Oral  SpO2:  98% 98% 100%  Weight:      Height:       General: alert, cooperative, and no distress Lochia: appropriate Uterine Fundus: firm Incision: Healing well with no significant drainage DVT Evaluation: No evidence of DVT seen on  physical exam. Negative Homan's sign. No cords or calf tenderness. Labs: Lab Results  Component Value Date   WBC 14.3 (H) 09/20/2021   HGB 11.1 (L) 09/20/2021   HCT 32.7 (L) 09/20/2021   MCV 83.6 09/20/2021   PLT 357 09/20/2021      Latest Ref Rng & Units 09/18/2021    4:44 PM  CMP  Glucose 70 - 99 mg/dL 85    BUN 6 - 20 mg/dL 7    Creatinine 0.44 - 1.00 mg/dL 0.53    Sodium 135 - 145 mmol/L 137    Potassium 3.5 - 5.1 mmol/L 3.9    Chloride 98 - 111 mmol/L 108    CO2 22 - 32 mmol/L 20    Calcium 8.9 - 10.3 mg/dL 9.0    Total Protein 6.5 - 8.1 g/dL 6.2    Total Bilirubin 0.3 - 1.2 mg/dL 0.2    Alkaline Phos 38 - 126 U/L 126    AST 15 - 41 U/L 22    ALT 0 - 44 U/L 26     Edinburgh Score:     View : No data to display.           After visit meds:  Allergies as of 09/21/2021       Reactions   Shellfish Allergy Anaphylaxis   Shellfish Allergy Swelling        Medication List     STOP taking these  medications    acetaminophen 325 MG tablet Commonly known as: TYLENOL   B-D 3CC LUER-LOK SYR 18GX1-1/2 18G X 1-1/2" 3 ML Misc Generic drug: SYRINGE-NEEDLE (DISP) 3 ML   B-D 3CC LUER-LOK SYR 21GX1-1/2 21G X 1-1/2" 3 ML Misc Generic drug: SYRINGE-NEEDLE (DISP) 3 ML   B-D HYPODERMIC NEEDLE 22GX1" 22G X 1" Misc Generic drug: NEEDLE (DISP) 22 G   BD Disp Needles 27G X 1/2" Misc Generic drug: NEEDLE (DISP) 27 G   EPINEPHrine 0.3 mg/0.3 mL Soaj injection Commonly known as: EPI-PEN   hydrOXYzine 25 MG tablet Commonly known as: ATARAX   medroxyPROGESTERone 10 MG tablet Commonly known as: PROVERA   ondansetron 8 MG tablet Commonly known as: Zofran       TAKE these medications    ibuprofen 600 MG tablet Commonly known as: ADVIL Take 1 tablet (600 mg total) by mouth every 6 (six) hours.   prenatal multivitamin Tabs tablet Take 1 tablet by mouth daily at 12 noon.   triamcinolone ointment 0.1 % Commonly known as: KENALOG Apply 1 application topically 2  (two) times daily for 14 days         Discharge home in stable condition Infant Feeding: Breast Infant Disposition:home with mother Discharge instruction: per After Visit Summary and Postpartum booklet. Activity: Advance as tolerated. Pelvic rest for 6 weeks.  Diet: routine diet Future Appointments:No future appointments. Follow up Visit: PPV in 6 weeks.   09/21/2021 Linda Hedges, DO

## 2021-09-21 NOTE — Lactation Note (Signed)
This note was copied from a baby's chart. Lactation Consultation Note  Patient Name: Samantha Bailey [id number]'V Date: 09/21/2021 Reason for consult: Follow-up assessment Age:29 hours  P1, Mother states she wants to pump and bottle feed and currently supplementing with formula due to difficulty latching.   Suggest mother consider OP lactation appt.  Reviewed engorgement care and monitoring voids/stools.    Feeding Mother's Current Feeding Choice: Breast Milk and Formula  LATCH Score Latch: Grasps breast easily, tongue down, lips flanged, rhythmical sucking.  Audible Swallowing: Spontaneous and intermittent  Type of Nipple: Everted at rest and after stimulation  Comfort (Breast/Nipple): Filling, red/small blisters or bruises, mild/mod discomfort  Hold (Positioning): Assistance needed to correctly position infant at breast and maintain latch.  LATCH Score: 8   Lactation Tools Discussed/Used  DEBP  Interventions Interventions: Education;Hand pump  Discharge    Consult Status Consult Status: Complete Date: 09/21/21    [name] [facility] 09/21/2021, 11:56 AM

## 2021-09-21 NOTE — Discharge Instructions (Signed)
Call MD for T>100.4, heavy vaginal bleeding, severe abdominal pain, or respiratory distress.  Call office to schedule postpartum visit in 6 weeks.  Pelvic rest x 6 weeks.   

## 2021-09-24 ENCOUNTER — Inpatient Hospital Stay (HOSPITAL_COMMUNITY): Payer: No Typology Code available for payment source

## 2021-09-24 ENCOUNTER — Inpatient Hospital Stay (HOSPITAL_COMMUNITY)
Admission: AD | Admit: 2021-09-24 | Payer: No Typology Code available for payment source | Source: Home / Self Care | Admitting: Obstetrics and Gynecology

## 2021-09-26 ENCOUNTER — Inpatient Hospital Stay (HOSPITAL_COMMUNITY): Payer: No Typology Code available for payment source

## 2021-09-29 ENCOUNTER — Telehealth (HOSPITAL_COMMUNITY): Payer: Self-pay | Admitting: *Deleted

## 2021-09-29 NOTE — Telephone Encounter (Signed)
Hospital Discharge Follow-Up Call:  Patient reports that she is well and has no concerns about her healing process.  EPDS today was 2 and she endorses this accurately reflects that she is doing well emotionally.  She also said that she had requested an antidepressant from her OB and she will be starting Zoloft when she picks up the prescription in two days.  She declines postpartum group information.  Patient says that baby is well and she has no concerns about baby's health.  She reports that baby sleeps in a bedside bassinet.  Reviewed ABCs of Safe Sleep.

## 2021-10-01 ENCOUNTER — Other Ambulatory Visit (HOSPITAL_COMMUNITY): Payer: Self-pay

## 2021-10-01 MED ORDER — SERTRALINE HCL 50 MG PO TABS
50.0000 mg | ORAL_TABLET | Freq: Every day | ORAL | 3 refills | Status: DC
  Filled 2021-10-01: qty 30, 30d supply, fill #0

## 2021-10-04 ENCOUNTER — Other Ambulatory Visit (HOSPITAL_COMMUNITY): Payer: Self-pay

## 2021-10-18 ENCOUNTER — Telehealth: Payer: Self-pay | Admitting: Family Medicine

## 2021-10-18 NOTE — Telephone Encounter (Signed)
Pt is calling to check on the status to see if her CPE form is completed. Form dropped off over a weeek and a half ago. Please advise

## 2021-11-05 NOTE — Progress Notes (Deleted)
I,Jana Edgerrin Correia,acting as a Neurosurgeon for [employer], PA-C.,have documented all relevant documentation on the behalf of Debera Lat, PA-C,as directed by  [employer], PA-C while in the presence of [employer], PA-C.   Complete physical exam   Patient: Samantha Bailey, Samantha Bailey   DOB: 28-Apr-1990   31 y.o. Female  MRN: [id number] Visit Date: 11/06/2021  Today's healthcare provider: Debera Lat, PA-C   No chief complaint on file.  Subjective    Samantha Bailey is a 31 y.o. female who presents today for a complete physical exam.  She reports consuming a {diet types:17450} diet. {Exercise:19826} She generally feels {well/fairly well/poorly:18703}. She reports sleeping {well/fairly well/poorly:18703}. She {does/does not:200015} have additional problems to discuss today.  HPI  ***  Past Medical History:  Diagnosis Date   Allergic rhinitis    Chlamydia 2008   Medical history non-contributory    PCOS (polycystic ovarian syndrome)    Pregnancy induced hypertension    Past Surgical History:  Procedure Laterality Date   IR ANGIOGRAM SELECTIVE EACH ADDITIONAL VESSEL  06/20/2019   IR ANGIOGRAM SELECTIVE EACH ADDITIONAL VESSEL  06/20/2019   IR ANGIOGRAM SELECTIVE EACH ADDITIONAL VESSEL  06/20/2019   IR ANGIOGRAM SELECTIVE EACH ADDITIONAL VESSEL  06/20/2019   IR ANGIOGRAM SELECTIVE EACH ADDITIONAL VESSEL  06/20/2019   IR ANGIOGRAM SELECTIVE EACH ADDITIONAL VESSEL  06/20/2019   IR ANGIOGRAM SELECTIVE EACH ADDITIONAL VESSEL  06/20/2019   IR ANGIOGRAM VISCERAL SELECTIVE  06/20/2019   IR EMBO ART  VEN HEMORR LYMPH EXTRAV  INC GUIDE ROADMAPPING  06/20/2019   IR US GUIDE VASC ACCESS RIGHT  06/20/2019   WISDOM TOOTH EXTRACTION  2013   no problems with anesthesia   Social History   Socioeconomic History   Marital status: Married    Spouse name: Jomarie Longs   Number of children: 0   Years of education: college   Highest education level: Not on file  Occupational History    Employer:  B[employer]    Comment: [employer]   Occupation: student    Comment: Nursing School at [employer]  Tobacco Use   Smoking status: Never   Smokeless tobacco: Never  Vaping Use   Vaping Use: Never used  Substance and Sexual Activity   Alcohol use: No   Drug use: No   Sexual activity: Yes    Partners: Male    Birth control/protection: None  Other Topics Concern   Not on file  Social History Narrative   ** Merged History Encounter **       Social Determinants of Health   Financial Resource Strain: Not on file  Food Insecurity: Not on file  Transportation Needs: Not on file  Physical Activity: Not on file  Stress: Not on file  Social Connections: Not on file  Intimate Partner Violence: Not on file   Family Status  Relation Name Status   Mother  Alive   Father  Alive   MGM  Alive   MGF  Alive   PGM  Alive   PGF  Alive   Neg Hx  (Not Specified)   Family History  Problem Relation Age of Onset   Healthy Mother    Hypertension Father    Healthy Maternal Grandmother    Healthy Maternal Grandfather    Healthy Paternal Grandmother    Healthy Paternal Grandfather    Breast cancer Neg Hx    Cancer - Colon Neg Hx    Cervical cancer Neg Hx    Colon cancer  Neg Hx    Ovarian cancer Neg Hx    Allergies  Allergen Reactions   Shellfish Allergy Anaphylaxis   Shellfish Allergy Swelling    Patient Care Team: Trey Sailors, PA-C (Inactive) as PCP - General (Physician Assistant) Trey Sailors, PA-C (Inactive) (Physician Assistant) Meisinger, Tawanna Cooler, MD as Consulting Physician (Obstetrics and Gynecology)   Medications: Outpatient Medications Prior to Visit  Medication Sig   ibuprofen (ADVIL) 600 MG tablet Take 1 tablet (600 mg total) by mouth every 6 (six) hours.   Prenatal Vit-Fe Fumarate-FA (PRENATAL MULTIVITAMIN) TABS tablet Take 1 tablet by mouth daily at 12 noon.   sertraline (ZOLOFT) 50 MG tablet Take 1 tablet (50 mg total) by mouth daily.    triamcinolone ointment (KENALOG) 0.1 % Apply 1 application topically 2 (two) times daily for 14 days   No facility-administered medications prior to visit.    Review of Systems  {Labs  Heme  Chem  Endocrine  Serology  Results Review (optional):23779}  Objective    There were no vitals taken for this visit. {Show previous vital signs (optional):23777}   Physical Exam  ***  Last depression screening scores    11/01/2020   10:03 AM 07/19/2019    2:07 PM 04/08/2018    8:06 AM  PHQ 2/9 Scores  PHQ - 2 Score 0 0 0  PHQ- 9 Score 0 0 0   Last fall risk screening    11/01/2020   10:03 AM  Fall Risk   Falls in the past year? 0  Number falls in past yr: 0  Injury with Fall? 0  Risk for fall due to : No Fall Risks  Follow up Falls evaluation completed   Last Audit-C alcohol use screening    11/01/2020   10:03 AM  Alcohol Use Disorder Test (AUDIT)  1. How often do you have a drink containing alcohol? 0  2. How many drinks containing alcohol do you have on a typical day when you are drinking? 0  3. How often do you have six or more drinks on one occasion? 0  AUDIT-C Score 0   A score of 3 or more in women, and 4 or more in men indicates increased risk for alcohol abuse, EXCEPT if all of the points are from question 1   No results found for any visits on 11/06/21.  Assessment & Plan    Routine Health Maintenance and Physical Exam  Exercise Activities and Dietary recommendations  Goals   None     Immunization History  Administered Date(s) Administered   DTaP 12/08/1992, 04/09/1993, 08/02/1993, 12/16/1996   HPV Quadrivalent 02/24/2007, 05/11/2007, 11/17/2007   Hepatitis B 16-Jan-1993, 12/08/1992, 01/14/1994   IPV 12/08/1992, 04/09/1993, 08/02/1993, 12/16/1996   Influenza,inj,Quad PF,6+ Mos 02/03/2017, 04/08/2018   MMR 09/13/1993, 12/16/1996, 10/11/2016   Meningococcal Conjugate 02/24/2007   PPD Test 12/08/2015   Td 04/08/2018   Tdap 02/24/2007, 07/19/2019     Health Maintenance  Topic Date Due   COVID-19 Vaccine (1) Never done   INFLUENZA VACCINE  11/20/2021   PAP-Cervical Cytology Screening  10/13/2023   PAP SMEAR-Modifier  10/13/2023   TETANUS/TDAP  07/18/2029   HPV VACCINES  Completed   Hepatitis C Screening  Completed   HIV Screening  Completed    Discussed health benefits of physical activity, and encouraged her to engage in regular exercise appropriate for her age and condition.  ***  No follow-ups on file.     {provider attestation***:1}   Debera Lat, PA-C  [place] [phone] (phone) [phone] (fax)  [place]

## 2021-11-06 ENCOUNTER — Encounter: Payer: Self-pay | Admitting: Physician Assistant

## 2021-11-06 ENCOUNTER — Encounter: Payer: No Typology Code available for payment source | Admitting: Physician Assistant

## 2021-11-06 DIAGNOSIS — F411 Generalized anxiety disorder: Secondary | ICD-10-CM

## 2021-11-06 DIAGNOSIS — I10 Essential (primary) hypertension: Secondary | ICD-10-CM

## 2021-11-06 DIAGNOSIS — Z0000 Encounter for general adult medical examination without abnormal findings: Secondary | ICD-10-CM

## 2021-11-06 DIAGNOSIS — L718 Other rosacea: Secondary | ICD-10-CM

## 2021-11-06 DIAGNOSIS — J301 Allergic rhinitis due to pollen: Secondary | ICD-10-CM

## 2021-11-06 DIAGNOSIS — Z91013 Allergy to seafood: Secondary | ICD-10-CM

## 2021-11-09 ENCOUNTER — Other Ambulatory Visit (HOSPITAL_COMMUNITY): Payer: Self-pay

## 2021-11-09 MED ORDER — WEGOVY 0.5 MG/0.5ML ~~LOC~~ SOAJ
0.5000 mL | SUBCUTANEOUS | 0 refills | Status: DC
  Filled 2021-11-09 – 2021-11-28 (×3): qty 2, 28d supply, fill #0

## 2021-11-12 ENCOUNTER — Other Ambulatory Visit (HOSPITAL_COMMUNITY): Payer: Self-pay

## 2021-11-24 ENCOUNTER — Telehealth: Payer: No Typology Code available for payment source | Admitting: Nurse Practitioner

## 2021-11-24 DIAGNOSIS — N61 Mastitis without abscess: Secondary | ICD-10-CM

## 2021-11-24 MED ORDER — CEPHALEXIN 500 MG PO CAPS: 500.0000 mg | ORAL_CAPSULE | Freq: Three times a day (TID) | ORAL | 0 refills | Status: DC

## 2021-11-24 NOTE — Patient Instructions (Signed)
Mastitis  Mastitis is inflammation of the breast tissue. It occurs most often in women who are breastfeeding, but it can also affect other women, and sometimes even men. Mastitis will sometimes go away on its own. A health care provider will help determine if medical treatment is needed. What are the causes? This condition is usually caused by a bacterial infection. Bacteria can enter the breast tissue through cuts, cracks, or openings in the skin. This usually occurs with breastfeeding because of cracked or irritated nipples. Sometimes, mastitis can occur when there are no cuts or openings in the skin. This is usually caused by plugged milk ducts. Plugged milk ducts block the flow of milk in the breast. Other causes include: Nipple piercing. Some forms of breast cancer. What are the signs or symptoms? Symptoms of this condition include: Swelling, redness, tenderness, and pain in an area of the breast. The area may also feel warm to the touch. These symptoms usually affect the upper part of the breast, toward the armpit region. Swelling of the glands under the arm on the same side. Discharge from the nipple. Fatigue, headache, and flu-like muscle aches. Fever and chills. Nausea and vomiting. Rapid pulse. Symptoms usually last 2-5 days. Breast pain and redness are at their worst on days 2 and 3, and they usually go away by day 5. If an infection is left to worsen, a collection of pus, or an abscess, may develop. How is this diagnosed? This condition can usually be diagnosed based on a physical exam and your symptoms. You may also have other tests, such as: Blood tests to check if your body is fighting an infection from bacteria. Mammogram or ultrasound tests to rule out other problems or diseases. Fluid tests. If an abscess has developed, the fluid in the abscess may be removed with a needle. The fluid may be checked to see whether bacteria are present. Breast milk testing. A sample of breast  milk may be tested for bacteria. This is done only when breastfeeding or pumping. How is this treated? Mastitis that occurs with breastfeeding will sometimes go away on its own, so your health care provider may choose to wait 24 hours after first seeing you to decide whether a prescription medicine is needed. If treatment is needed, it may include: Continuing to breastfeed or pump from both breasts to allow adequate milk flow and prevent an abscess from forming. Applying hot or cold compresses to the affected area. Medicine for pain. Antibiotic medicine to treat a bacterial infection. Self-care, such as rest and drinking more fluids. Using a needle to remove fluid from an abscess if one has developed. Follow these instructions at home: Breast care  Keep your nipples clean and dry. If directed, apply heat to the affected area of your breast. Use the heat source that your health care provider or lactation specialist recommends. If directed, place ice on the affected area of your breast. To do this: Put ice in a plastic bag. Place a towel between your skin and the bag. Leave the ice on for 20 minutes, 2-3 times a day. Remove the ice if your skin turns bright red. This is very important. If you cannot feel pain, heat, or cold, you have a greater risk of damage to the area. Breastfeeding and pumping tips Continue to breastfeed your baby on demand. This means feeding your baby whenever he or she is hungry. Ask your health care provider or lactation specialist whether you need to change your breastfeeding or pumping   routine. Avoid using nipple shields for feedings, if possible. Ask a lactation specialist for assistance if needed. Alternate the breast you offer first at each feeding to make sure your baby feeds from both breasts equally. Offer both breasts to your baby at every feeding. Use gentle breast massage during feeding or pumping sessions only as told by your health care provider or  lactation specialist. Avoid allowing your breasts to become very full with milk (engorged). If your breasts are engorged, you can hand express a small amount of milk for comfort. If you are pumping, continue to pump on the same schedule as you were before. In the breast with mastitis, pump until very little milk is coming out. Do not empty your breast. Emptying your breast causes your body to make more milk and can make symptoms worse. Ask your health care provider or lactation specialist whether you need to change your pumping routine. Medicines Take over-the-counter and prescription medicines as told by your health care provider. If you were prescribed an antibiotic medicine, take it as told by your health care provider. Do not stop using the antibiotic even if you start to feel better. Contact a health care provider if: You have pus-like discharge from the breast. You have a fever. Your symptoms do not improve within 2 days of starting treatment. Get help right away if: Your pain and swelling are getting worse. You have pain that is not controlled with medicine. You have a red line extending from the breast toward your armpit. Summary Mastitis is inflammation of the breast tissue. It occurs most often in women who are breastfeeding, but it can also affect non-breastfeeding women and some men. This condition is usually caused by a bacterial infection. This condition may be treated with hot and cold compresses, medicines, self-care, and breastfeeding. If you were prescribed an antibiotic medicine, take it as told by your health care provider. Do not stop using the antibiotic even if you start to feel better. This information is not intended to replace advice given to you by your health care provider. Make sure you discuss any questions you have with your health care provider. Document Revised: 05/11/2021 Document Reviewed: 02/07/2020 Elsevier Patient Education  2023 [employer]  

## 2021-11-24 NOTE — Progress Notes (Signed)
Virtual Visit Consent   [name], you are scheduled for a virtual visit with [name], FNP, a [facility] provider, today.     Just as with appointments in the office, your consent must be obtained to participate.  Your consent will be active for this visit and any virtual visit you may have with one of our providers in the next 365 days.     If you have a MyChart account, a copy of this consent can be sent to you electronically.  All virtual visits are billed to your insurance company just like a traditional visit in the office.    As this is a virtual visit, video technology does not allow for your provider to perform a traditional examination.  This may limit your provider's ability to fully assess your condition.  If your provider identifies any concerns that need to be evaluated in person or the need to arrange testing (such as labs, EKG, etc.), we will make arrangements to do so.     Although advances in technology are sophisticated, we cannot ensure that it will always work on either your end or our end.  If the connection with a video visit is poor, the visit may have to be switched to a telephone visit.  With either a video or telephone visit, we are not always able to ensure that we have a secure connection.     I need to obtain your verbal consent now.   Are you willing to proceed with your visit today? YES   Krizia Flight Myrick has provided verbal consent on 11/24/2021 for a virtual visit (video or telephone).   [name], FNP   Date: 11/24/2021 9:36 AM   Virtual Visit via Video Note   I, [name], connected with Samantha Bailey ([id number], Oct 09, 1990) on 11/24/21 at  9:45 AM EDT by a video-enabled telemedicine application and verified that I am speaking with the correct person using two identifiers.  Location: Patient: Virtual Visit Location Patient: Home Provider: Virtual Visit Location Provider: Mobile   I  discussed the limitations of evaluation and management by telemedicine and the availability of in person appointments. The patient expressed understanding and agreed to proceed.    History of Present Illness: Samantha Bailey is a 31 y.o. who identifies as a female who was assigned female at birth, and is being seen today for mastitis.  HPI: Patient states she developed body aches and chills yeaterday. Her breast started hurting last night. SH has a 58 week old that she is currently breast feeding. This morning shehas fever of 102 and body aches. Her breast are tender, and warm with mild erythema.    Review of Systems  Constitutional:  Positive for chills and fever. Negative for malaise/fatigue.  HENT: Negative.    Respiratory: Negative.    Neurological: Negative.     Problems:  Patient Active Problem List   Diagnosis Date Noted   Gestational hypertension 09/18/2021   Liver laceration, closed 06/20/2019   GAD (generalized anxiety disorder) 02/03/2017   Insomnia 02/03/2017   Acne rosacea, papular type 10/12/2015   Allergic rhinitis 10/12/2015    Allergies:  Allergies  Allergen Reactions   Shellfish Allergy Anaphylaxis   Shellfish Allergy Swelling   Medications:  Current Outpatient Medications:    cephALEXin (KEFLEX) 500 MG capsule, Take 1 capsule (500 mg total) by mouth 3 (three) times daily., Disp: 30 capsule, Rfl: 0   ibuprofen (ADVIL) 600 MG tablet, Take 1 tablet (600  mg total) by mouth every 6 (six) hours., Disp: 30 tablet, Rfl: 0   Prenatal Vit-Fe Fumarate-FA (PRENATAL MULTIVITAMIN) TABS tablet, Take 1 tablet by mouth daily at 12 noon., Disp: , Rfl:    Semaglutide-Weight Management (WEGOVY) 0.5 MG/0.5ML SOAJ, Inject 0.5 mg into the skin once a week., Disp: 2 mL, Rfl: 0   sertraline (ZOLOFT) 50 MG tablet, Take 1 tablet (50 mg total) by mouth daily., Disp: 30 tablet, Rfl: 3   triamcinolone ointment (KENALOG) 0.1 %, Apply 1 application topically 2 (two) times daily for 14  days, Disp: 45 g, Rfl: 0  Observations/Objective: Patient is well-developed, well-nourished in no acute distress.  Resting comfortably  at home.  Head is normocephalic, atraumatic.  No labored breathing.  Speech is clear and coherent with logical content.  Patient is alert and oriented at baseline.  Bil breast mild erythema and warm to touch according to patient  Assessment and Plan:  [name] in today with chief complaint of No chief complaint on file.   1. Mastitis Warm showers Cool compresses  Breat feed as often as  possible Tylenol for body aches and or fever  Meds ordered this encounter  Medications   cephALEXin (KEFLEX) 500 MG capsule    Sig: Take 1 capsule (500 mg total) by mouth 3 (three) times daily.    Dispense:  30 capsule    Refill:  0    Order Specific Question:   Supervising Provider    Answer:   Eber Hong [[id number]]      Follow Up Instructions: I discussed the assessment and treatment plan with the patient. The patient was provided an opportunity to ask questions and all were answered. The patient agreed with the plan and demonstrated an understanding of the instructions.  A copy of instructions were sent to the patient via MyChart.  The patient was advised to call back or seek an in-person evaluation if the symptoms worsen or if the condition fails to improve as anticipated.  Time:  I spent 6 minutes with the patient via telehealth technology discussing the above problems/concerns.    [name], FNP

## 2021-11-28 ENCOUNTER — Other Ambulatory Visit (HOSPITAL_COMMUNITY): Payer: Self-pay
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
## 2021-12-10 ENCOUNTER — Other Ambulatory Visit (HOSPITAL_COMMUNITY): Payer: Self-pay

## 2021-12-10 MED ORDER — WEGOVY 1 MG/0.5ML ~~LOC~~ SOAJ
1.0000 mg | SUBCUTANEOUS | 2 refills | Status: DC
  Filled 2021-12-10: qty 2, 28d supply, fill #0
  Filled 2022-01-21: qty 2, 28d supply, fill #1

## 2022-01-17 ENCOUNTER — Other Ambulatory Visit (HOSPITAL_COMMUNITY): Payer: Self-pay

## 2022-01-21 ENCOUNTER — Other Ambulatory Visit (HOSPITAL_COMMUNITY): Payer: Self-pay

## 2022-01-22 ENCOUNTER — Other Ambulatory Visit (HOSPITAL_COMMUNITY): Payer: Self-pay

## 2022-02-07 ENCOUNTER — Other Ambulatory Visit (HOSPITAL_COMMUNITY): Payer: Self-pay

## 2022-02-07 MED ORDER — WEGOVY 1.7 MG/0.75ML ~~LOC~~ SOAJ
1.7000 mg | SUBCUTANEOUS | 0 refills | Status: DC
  Filled 2022-02-07: qty 3, 28d supply, fill #0

## 2022-03-06 ENCOUNTER — Other Ambulatory Visit (HOSPITAL_COMMUNITY): Payer: Self-pay

## 2022-03-07 ENCOUNTER — Other Ambulatory Visit (HOSPITAL_COMMUNITY): Payer: Self-pay

## 2022-03-07 MED ORDER — WEGOVY 1.7 MG/0.75ML ~~LOC~~ SOAJ
1.7000 mg | SUBCUTANEOUS | 3 refills | Status: DC
Start: 2022-03-06 — End: 2023-04-17
  Filled 2022-03-07: qty 3, 28d supply, fill #0
  Filled 2022-03-27: qty 3, 28d supply, fill #1
  Filled 2022-04-22 – 2022-06-27 (×3): qty 3, 28d supply, fill #2

## 2022-03-07 MED ORDER — WEGOVY 1.7 MG/0.75ML ~~LOC~~ SOAJ
1.7000 mg | SUBCUTANEOUS | 0 refills | Status: DC
Start: 2022-03-06 — End: 2023-04-17
  Filled 2022-03-27 – 2022-09-11 (×3): qty 3, 28d supply, fill #0

## 2022-03-07 MED ORDER — FLUTICASONE PROPIONATE 50 MCG/ACT NA SUSP
2.0000 | Freq: Every day | NASAL | 12 refills | Status: DC
  Filled 2022-03-07: qty 16, 30d supply, fill #0

## 2022-03-27 ENCOUNTER — Other Ambulatory Visit (HOSPITAL_COMMUNITY): Payer: Self-pay

## 2022-03-29 ENCOUNTER — Other Ambulatory Visit (HOSPITAL_COMMUNITY): Payer: Self-pay

## 2022-04-03 LAB — OB RESULTS CONSOLE GBS: GBS: NEGATIVE

## 2022-04-23 ENCOUNTER — Other Ambulatory Visit: Payer: Self-pay

## 2022-04-29 ENCOUNTER — Other Ambulatory Visit (HOSPITAL_COMMUNITY): Payer: Self-pay

## 2022-05-02 ENCOUNTER — Other Ambulatory Visit (HOSPITAL_COMMUNITY): Payer: Self-pay

## 2022-05-02 MED ORDER — WEGOVY 2.4 MG/0.75ML ~~LOC~~ SOAJ
2.4000 mg | SUBCUTANEOUS | 0 refills | Status: DC
  Filled 2022-05-02: qty 3, 28d supply, fill #0

## 2022-05-31 ENCOUNTER — Other Ambulatory Visit (HOSPITAL_COMMUNITY): Payer: Self-pay

## 2022-06-01 ENCOUNTER — Other Ambulatory Visit (HOSPITAL_COMMUNITY): Payer: Self-pay

## 2022-06-01 MED ORDER — WEGOVY 2.4 MG/0.75ML ~~LOC~~ SOAJ
2.4000 mg | SUBCUTANEOUS | 0 refills | Status: DC
Start: 2022-05-31 — End: 2022-06-19
  Filled 2022-06-01: qty 3, 28d supply, fill #0

## 2022-06-03 ENCOUNTER — Other Ambulatory Visit (HOSPITAL_COMMUNITY): Payer: Self-pay

## 2022-06-19 ENCOUNTER — Other Ambulatory Visit (HOSPITAL_COMMUNITY): Payer: Self-pay

## 2022-06-19 MED ORDER — WEGOVY 2.4 MG/0.75ML ~~LOC~~ SOAJ
2.4000 mg | SUBCUTANEOUS | 0 refills | Status: DC
Start: 2022-06-19 — End: 2023-04-17
  Filled 2022-06-19 – 2022-06-21 (×2): qty 9, 84d supply, fill #0
  Filled 2022-06-27: qty 3, 28d supply, fill #0
  Filled 2022-08-03: qty 3, 28d supply, fill #1

## 2022-06-21 ENCOUNTER — Other Ambulatory Visit (HOSPITAL_COMMUNITY): Payer: Self-pay

## 2022-06-24 ENCOUNTER — Other Ambulatory Visit (HOSPITAL_COMMUNITY): Payer: Self-pay

## 2022-06-27 ENCOUNTER — Other Ambulatory Visit (HOSPITAL_COMMUNITY): Payer: Self-pay

## 2022-06-27 ENCOUNTER — Other Ambulatory Visit: Payer: Self-pay

## 2022-06-28 DIAGNOSIS — J358 Other chronic diseases of tonsils and adenoids: Secondary | ICD-10-CM | POA: Diagnosis not present

## 2022-06-28 DIAGNOSIS — H6123 Impacted cerumen, bilateral: Secondary | ICD-10-CM | POA: Diagnosis not present

## 2022-07-04 ENCOUNTER — Other Ambulatory Visit (HOSPITAL_COMMUNITY): Payer: Self-pay

## 2022-07-04 ENCOUNTER — Other Ambulatory Visit: Payer: Self-pay

## 2022-07-10 ENCOUNTER — Other Ambulatory Visit (HOSPITAL_COMMUNITY): Payer: Self-pay

## 2022-07-11 ENCOUNTER — Other Ambulatory Visit: Payer: Self-pay

## 2022-08-12 ENCOUNTER — Other Ambulatory Visit (HOSPITAL_COMMUNITY): Payer: Self-pay

## 2022-08-13 DIAGNOSIS — Z01419 Encounter for gynecological examination (general) (routine) without abnormal findings: Secondary | ICD-10-CM | POA: Diagnosis not present

## 2022-08-13 DIAGNOSIS — Z319 Encounter for procreative management, unspecified: Secondary | ICD-10-CM | POA: Diagnosis not present

## 2022-08-13 DIAGNOSIS — E282 Polycystic ovarian syndrome: Secondary | ICD-10-CM | POA: Diagnosis not present

## 2022-08-13 DIAGNOSIS — Z6829 Body mass index (BMI) 29.0-29.9, adult: Secondary | ICD-10-CM | POA: Diagnosis not present

## 2022-08-15 DIAGNOSIS — D1801 Hemangioma of skin and subcutaneous tissue: Secondary | ICD-10-CM | POA: Diagnosis not present

## 2022-08-15 DIAGNOSIS — D485 Neoplasm of uncertain behavior of skin: Secondary | ICD-10-CM | POA: Diagnosis not present

## 2022-08-15 DIAGNOSIS — D2222 Melanocytic nevi of left ear and external auricular canal: Secondary | ICD-10-CM | POA: Diagnosis not present

## 2022-08-15 DIAGNOSIS — L578 Other skin changes due to chronic exposure to nonionizing radiation: Secondary | ICD-10-CM | POA: Diagnosis not present

## 2022-08-15 DIAGNOSIS — D225 Melanocytic nevi of trunk: Secondary | ICD-10-CM | POA: Diagnosis not present

## 2022-08-15 DIAGNOSIS — L814 Other melanin hyperpigmentation: Secondary | ICD-10-CM | POA: Diagnosis not present

## 2022-09-03 ENCOUNTER — Other Ambulatory Visit (HOSPITAL_COMMUNITY): Payer: Self-pay

## 2022-09-03 ENCOUNTER — Telehealth: Payer: 59 | Admitting: Family Medicine

## 2022-09-03 DIAGNOSIS — H6692 Otitis media, unspecified, left ear: Secondary | ICD-10-CM | POA: Diagnosis not present

## 2022-09-03 MED ORDER — NEOMYCIN-POLYMYXIN-HC 3.5-10000-1 OT SOLN
4.0000 [drp] | Freq: Four times a day (QID) | OTIC | 0 refills | Status: DC
  Filled 2022-09-03: qty 10, 10d supply, fill #0

## 2022-09-03 NOTE — Progress Notes (Signed)
Virtual Visit Consent   Samantha Bailey [facility], you are scheduled for a virtual visit with a [facility] provider today. Just as with appointments in the office, your consent must be obtained to participate. Your consent will be active for this visit and any virtual visit you may have with one of our providers in the next 365 days. If you have a MyChart account, a copy of this consent can be sent to you electronically.  As this is a virtual visit, video technology does not allow for your provider to perform a traditional examination. This may limit your provider's ability to fully assess your condition. If your provider identifies any concerns that need to be evaluated in person or the need to arrange testing (such as labs, EKG, etc.), we will make arrangements to do so. Although advances in technology are sophisticated, we cannot ensure that it will always work on either your end or our end. If the connection with a video visit is poor, the visit may have to be switched to a telephone visit. With either a video or telephone visit, we are not always able to ensure that we have a secure connection.  By engaging in this virtual visit, you consent to the provision of healthcare and authorize for your insurance to be billed (if applicable) for the services provided during this visit. Depending on your insurance coverage, you may receive a charge related to this service.  I need to obtain your verbal consent now. Are you willing to proceed with your visit today? Samantha Bailey has provided verbal consent on 09/03/2022 for a virtual visit (video or telephone). Freddy Finner, NP  Date: 09/03/2022 9:39 AM  Virtual Visit via Video Note   I, Freddy Finner, connected with  Samantha Bailey [facility]  ([id number], [birth year]) on 09/03/22 at  9:30 AM EDT by a video-enabled telemedicine application and verified that I am speaking with the correct person using two identifiers.  Location: Patient: Virtual Visit  Location Patient: Home Provider: Virtual Visit Location Provider: Home Office   I discussed the limitations of evaluation and management by telemedicine and the availability of in person appointments. The patient expressed understanding and agreed to proceed.    History of Present Illness: Samantha Bailey is a 32 y.o. who identifies as a female who was assigned female at birth, and is being seen today for ear pain. Reports that she has her ears suctioned often by ENT- due to high ear wax production- and debrox does not work. The wax makes her ear itch. She reported she scratched the inside of the ear itching it and now it is very irritated and painful. No drainage is noted. Tenderness to movement and along jaw and down neck some.    Problems:  Patient Active Problem List   Diagnosis Date Noted   Gestational hypertension 09/18/2021   Liver laceration, closed 06/20/2019   GAD (generalized anxiety disorder) 02/03/2017   Insomnia 02/03/2017   Acne rosacea, papular type 10/12/2015   Allergic rhinitis 10/12/2015    Allergies:  Allergies  Allergen Reactions   Shellfish Allergy Anaphylaxis   Shellfish Allergy Swelling   Medications:  Current Outpatient Medications:    cephALEXin (KEFLEX) 500 MG capsule, Take 1 capsule (500 mg total) by mouth 3 (three) times daily., Disp: 30 capsule, Rfl: 0   fluticasone (GNP FLUTICASONE PROPIONATE) 50 MCG/ACT nasal spray, Place 2 sprays into both nostrils once daily., Disp: 16 g, Rfl: 12   ibuprofen (ADVIL) 600 MG tablet, Take  1 tablet (600 mg total) by mouth every 6 (six) hours., Disp: 30 tablet, Rfl: 0   Prenatal Vit-Fe Fumarate-FA (PRENATAL MULTIVITAMIN) TABS tablet, Take 1 tablet by mouth daily at 12 noon., Disp: , Rfl:    Semaglutide-Weight Management (WEGOVY) 0.5 MG/0.5ML SOAJ, Inject 0.5 mg into the skin once a week., Disp: 2 mL, Rfl: 0   Semaglutide-Weight Management (WEGOVY) 1.7 MG/0.75ML SOAJ, Inject 1.7 mg into the skin once a week as  directed, Disp: 3 mL, Rfl: 3   Semaglutide-Weight Management (WEGOVY) 1.7 MG/0.75ML SOAJ, Inject 1.7 mg into the skin once a week., Disp: 3 mL, Rfl: 0   Semaglutide-Weight Management (WEGOVY) 2.4 MG/0.75ML SOAJ, Inject 2.4 mg into the skin once a week as directed, Disp: 9 mL, Rfl: 0   sertraline (ZOLOFT) 50 MG tablet, Take 1 tablet (50 mg total) by mouth daily., Disp: 30 tablet, Rfl: 3   triamcinolone ointment (KENALOG) 0.1 %, Apply 1 application topically 2 (two) times daily for 14 days, Disp: 45 g, Rfl: 0  Observations/Objective: Patient is well-developed, well-nourished in no acute distress.  Resting comfortably  at home.  Head is normocephalic, atraumatic.  No labored breathing.  Speech is clear and coherent with logical content.  Patient is alert and oriented at baseline.    Assessment and Plan:  1. Acute infection of left ear  - neomycin-polymyxin-hydrocortisone (CORTISPORIN) OTIC solution; Place 4 drops into the left ear 4 (four) times daily.  Dispense: 10 mL; Refill: 0  -use drops -follow up with ENT -if no improvement be seen in person so someone can look in the ear  -is 7 weeks pregnancy- epocrates review eof drops safe in pregnancy given low systematic absorption   Reviewed side effects, risks and benefits of medication.    Patient acknowledged agreement and understanding of the plan.   Past Medical, Surgical, Social History, Allergies, and Medications have been Reviewed.   Follow Up Instructions: I discussed the assessment and treatment plan with the patient. The patient was provided an opportunity to ask questions and all were answered. The patient agreed with the plan and demonstrated an understanding of the instructions.  A copy of instructions were sent to the patient via MyChart unless otherwise noted below.     The patient was advised to call back or seek an in-person evaluation if the symptoms worsen or if the condition fails to improve as  anticipated.  Time:  I spent 10 minutes with the patient via telehealth technology discussing the above problems/concerns.    Freddy Finner, NP

## 2022-09-03 NOTE — Patient Instructions (Signed)
[name] Fuelling, thank you for joining Freddy Finner, NP for today's virtual visit.  While this provider is not your primary care provider (PCP), if your PCP is located in our provider database this encounter information will be shared with them immediately following your visit.   A [facility] MyChart account gives you access to today's visit and all your visits, tests, and labs performed at [facility] " click here if you don't have a [facility] MyChart account or go to [email or web address]  Consent: (Patient) Samantha Bailey provided verbal consent for this virtual visit at the beginning of the encounter.  Current Medications:  Current Outpatient Medications:    cephALEXin (KEFLEX) 500 MG capsule, Take 1 capsule (500 mg total) by mouth 3 (three) times daily., Disp: 30 capsule, Rfl: 0   fluticasone (GNP FLUTICASONE PROPIONATE) 50 MCG/ACT nasal spray, Place 2 sprays into both nostrils once daily., Disp: 16 g, Rfl: 12   ibuprofen (ADVIL) 600 MG tablet, Take 1 tablet (600 mg total) by mouth every 6 (six) hours., Disp: 30 tablet, Rfl: 0   neomycin-polymyxin-hydrocortisone (CORTISPORIN) OTIC solution, Place 4 drops into the left ear 4 (four) times daily., Disp: 10 mL, Rfl: 0   Prenatal Vit-Fe Fumarate-FA (PRENATAL MULTIVITAMIN) TABS tablet, Take 1 tablet by mouth daily at 12 noon., Disp: , Rfl:    Semaglutide-Weight Management (WEGOVY) 0.5 MG/0.5ML SOAJ, Inject 0.5 mg into the skin once a week., Disp: 2 mL, Rfl: 0   Semaglutide-Weight Management (WEGOVY) 1.7 MG/0.75ML SOAJ, Inject 1.7 mg into the skin once a week as directed, Disp: 3 mL, Rfl: 3   Semaglutide-Weight Management (WEGOVY) 1.7 MG/0.75ML SOAJ, Inject 1.7 mg into the skin once a week., Disp: 3 mL, Rfl: 0   Semaglutide-Weight Management (WEGOVY) 2.4 MG/0.75ML SOAJ, Inject 2.4 mg into the skin once a week as directed, Disp: 9 mL, Rfl: 0   sertraline (ZOLOFT) 50 MG tablet, Take 1 tablet (50 mg total) by mouth  daily., Disp: 30 tablet, Rfl: 3   triamcinolone ointment (KENALOG) 0.1 %, Apply 1 application topically 2 (two) times daily for 14 days, Disp: 45 g, Rfl: 0   Medications ordered in this encounter:  Meds ordered this encounter  Medications   neomycin-polymyxin-hydrocortisone (CORTISPORIN) OTIC solution    Sig: Place 4 drops into the left ear 4 (four) times daily.    Dispense:  10 mL    Refill:  0    Order Specific Question:   Supervising Provider    Answer:   Merrilee Jansky [[id number]]     *If you need refills on other medications prior to your next appointment, please contact your pharmacy*  Follow-Up: Call back or seek an in-person evaluation if the symptoms worsen or if the condition fails to improve as anticipated.  [facility] [phone]  Other Instructions Use drops as directed  Follow up in person if not improving   If you have been instructed to have an in-person evaluation today at a local Urgent Care facility, please use the link below. It will take you to a list of all of our available [facility], including address, phone number and hours of operation. Please do not delay care.  [facility]  If you or a family member do not have a primary care provider, use the link below to schedule a visit and establish care. When you choose a [facility] primary care physician or advanced practice provider, you gain a long-term partner in  health. Find a Primary Care Provider  Learn more about [facility]'s in-office and virtual care options: [facility] - Get Care Now

## 2022-09-04 ENCOUNTER — Other Ambulatory Visit (HOSPITAL_COMMUNITY): Payer: Self-pay

## 2022-09-04 ENCOUNTER — Encounter: Payer: Self-pay | Admitting: Family Medicine

## 2022-09-04 DIAGNOSIS — H66003 Acute suppurative otitis media without spontaneous rupture of ear drum, bilateral: Secondary | ICD-10-CM

## 2022-09-04 MED ORDER — AMOXICILLIN 875 MG PO TABS
875.0000 mg | ORAL_TABLET | Freq: Two times a day (BID) | ORAL | 0 refills | Status: AC
  Filled 2022-09-04: qty 20, 10d supply, fill #0

## 2022-09-04 MED ORDER — AMOXICILLIN-POT CLAVULANATE 875-125 MG PO TABS
1.0000 | ORAL_TABLET | Freq: Two times a day (BID) | ORAL | 0 refills | Status: DC
  Filled 2022-09-04: qty 20, 10d supply, fill #0

## 2022-09-04 NOTE — Addendum Note (Signed)
Addended by: Margaretann Loveless on: 09/04/2022 04:03 PM   Modules accepted: Orders

## 2022-09-05 DIAGNOSIS — N911 Secondary amenorrhea: Secondary | ICD-10-CM | POA: Diagnosis not present

## 2022-09-09 ENCOUNTER — Other Ambulatory Visit (HOSPITAL_COMMUNITY): Payer: Self-pay

## 2022-09-09 MED ORDER — UNISOM SLEEPTABS 25 MG PO TABS
ORAL_TABLET | ORAL | 1 refills | Status: DC
  Filled 2022-09-11: qty 28, fill #0

## 2022-09-09 MED ORDER — VITAMIN B-6 25 MG PO TABS: 25.0000 mg | ORAL_TABLET | Freq: Every day | ORAL | 1 refills | Status: DC

## 2022-09-10 ENCOUNTER — Other Ambulatory Visit (HOSPITAL_COMMUNITY): Payer: Self-pay

## 2022-09-10 DIAGNOSIS — N911 Secondary amenorrhea: Secondary | ICD-10-CM | POA: Diagnosis not present

## 2022-09-11 ENCOUNTER — Other Ambulatory Visit (HOSPITAL_COMMUNITY): Payer: Self-pay

## 2022-09-25 DIAGNOSIS — Z3A09 9 weeks gestation of pregnancy: Secondary | ICD-10-CM | POA: Diagnosis not present

## 2022-09-25 DIAGNOSIS — Z3685 Encounter for antenatal screening for Streptococcus B: Secondary | ICD-10-CM | POA: Diagnosis not present

## 2022-09-25 DIAGNOSIS — Z3481 Encounter for supervision of other normal pregnancy, first trimester: Secondary | ICD-10-CM | POA: Diagnosis not present

## 2022-09-25 LAB — OB RESULTS CONSOLE HEPATITIS B SURFACE ANTIGEN: Hepatitis B Surface Ag: NEGATIVE

## 2022-09-25 LAB — OB RESULTS CONSOLE HIV ANTIBODY (ROUTINE TESTING): HIV: NONREACTIVE

## 2022-09-25 LAB — OB RESULTS CONSOLE RPR: RPR: NONREACTIVE

## 2022-09-25 LAB — OB RESULTS CONSOLE GC/CHLAMYDIA
Chlamydia: NEGATIVE
Neisseria Gonorrhea: NEGATIVE

## 2022-09-25 LAB — OB RESULTS CONSOLE RUBELLA ANTIBODY, IGM: Rubella: IMMUNE

## 2022-09-25 LAB — HEPATITIS C ANTIBODY: HCV Ab: NEGATIVE

## 2022-09-26 DIAGNOSIS — H6123 Impacted cerumen, bilateral: Secondary | ICD-10-CM | POA: Diagnosis not present

## 2022-09-26 DIAGNOSIS — H9202 Otalgia, left ear: Secondary | ICD-10-CM | POA: Diagnosis not present

## 2022-10-07 DIAGNOSIS — Z3480 Encounter for supervision of other normal pregnancy, unspecified trimester: Secondary | ICD-10-CM | POA: Diagnosis not present

## 2022-10-07 DIAGNOSIS — Z3A10 10 weeks gestation of pregnancy: Secondary | ICD-10-CM | POA: Diagnosis not present

## 2022-10-07 DIAGNOSIS — Z3481 Encounter for supervision of other normal pregnancy, first trimester: Secondary | ICD-10-CM | POA: Diagnosis not present

## 2022-10-07 DIAGNOSIS — Z113 Encounter for screening for infections with a predominantly sexual mode of transmission: Secondary | ICD-10-CM | POA: Diagnosis not present

## 2022-10-30 ENCOUNTER — Other Ambulatory Visit: Payer: Self-pay | Admitting: Oncology

## 2022-10-30 DIAGNOSIS — Z006 Encounter for examination for normal comparison and control in clinical research program: Secondary | ICD-10-CM

## 2022-11-01 ENCOUNTER — Telehealth: Payer: 59

## 2022-11-01 ENCOUNTER — Telehealth: Payer: 59 | Admitting: Physician Assistant

## 2022-11-01 ENCOUNTER — Other Ambulatory Visit (HOSPITAL_COMMUNITY): Payer: Self-pay

## 2022-11-01 DIAGNOSIS — H60392 Other infective otitis externa, left ear: Secondary | ICD-10-CM | POA: Diagnosis not present

## 2022-11-01 MED ORDER — AMOXICILLIN 875 MG PO TABS
875.0000 mg | ORAL_TABLET | Freq: Two times a day (BID) | ORAL | 0 refills | Status: AC
  Filled 2022-11-01: qty 20, 10d supply, fill #0

## 2022-11-01 MED ORDER — CIPROFLOXACIN-DEXAMETHASONE 0.3-0.1 % OT SUSP
4.0000 [drp] | Freq: Two times a day (BID) | OTIC | 0 refills | Status: DC
  Filled 2022-11-01: qty 7.5, 7d supply, fill #0

## 2022-11-01 NOTE — Progress Notes (Signed)
E Visit for Ear Pain - Swimmer's Ear  We are sorry that you are not feeling well. Here is how we plan to help!  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  Swimmer's ear is a redness or swelling, irritation, or infection of your outer ear canal. These symptoms usually occur within a few days of swimming. Your ear canal is a tube that goes from the opening of the ear to the eardrum.  When water stays in your ear canal, germs can grow.  This is a painful condition that often happens to children and swimmers of all ages.  It is not contagious and oral antibiotics are not required to treat uncomplicated swimmer's ear.  The usual symptoms include:    Itchiness inside the ear  Redness or a sense of swelling in the ear  Pain when the ear is tugged on when pressure is placed on the ear  Pus draining from the infected ear   I have prescribed Amoxicillin 875 mg one tablet twice daily for 10 days  I have prescribed: Ciprofloxin 0.2% and dexamethasone 1% otic suspension 4 drops in affected ears twice daily for 7 days  In certain cases, swimmer's ear may progress to a more serious bacterial infection of the middle or inner ear.  If you have a fever 102 and up and significantly worsening symptoms, this could indicate a more serious infection moving to the middle/inner and needs face to face evaluation in an office by a provider.  Your symptoms should improve over the next 3 days and should resolve in about 7 days.  Be sure to complete ALL of your prescription.  HOME CARE: Wash your hands frequently. If you are prescribed an ear drop, do not place the tip of the bottle on your ear or touch it with your fingers. You can take Acetaminophen 650 mg every 4-6 hours as needed for pain.  If pain is severe or moderate, you can apply a heating pad (set on low) or hot water bottle (wrapped in a towel) to outer ear for 20 minutes.  This will also increase drainage. Avoid ear plugs Do  not go swimming until the symptoms are gone Do not use Q-tips After showers, help the water run out by tilting your head to one side.   GET HELP RIGHT AWAY IF: Fever is over 102.2 degrees. You develop progressive ear pain or hearing loss. Ear symptoms persist longer than 3 days after treatment.  MAKE SURE YOU: Understand these instructions. Will watch your condition. Will get help right away if you are not doing well or get worse.  TO PREVENT SWIMMER'S EAR: Use a bathing cap or custom fitted swim molds to keep your ears dry. Towel off after swimming to dry your ears. Tilt your head or pull your earlobes to allow the water to escape your ear canal. If there is still water in your ears, consider using a hairdryer on the lowest setting.  Thank you for choosing an e-visit.  Your e-visit answers were reviewed by a board certified advanced clinical practitioner to complete your personal care plan. Depending upon the condition, your plan could have included both over the counter or prescription medications.  Please review your pharmacy choice. Make sure the pharmacy is open so you can pick up the prescription now. If there is a problem, you may contact your provider through [employer] and have the prescription routed to another pharmacy.  Your safety is  important to [place]. If you have drug allergies check your prescription carefully.   For the next 24 hours you can use MyChart to ask questions about today's visit, request a non-urgent call back, or ask for a work or school excuse. You will get an email with a survey after your eVisit asking about your experience. We would appreciate your feedback. I hope that your e-visit has been valuable and will aid in your recovery.  I have spent 5 minutes in review of e-visit questionnaire, review and updating patient chart, medical decision making and response to patient.   Margaretann Loveless, PA-C

## 2022-11-08 DIAGNOSIS — Z361 Encounter for antenatal screening for raised alphafetoprotein level: Secondary | ICD-10-CM | POA: Diagnosis not present

## 2022-12-05 DIAGNOSIS — Z363 Encounter for antenatal screening for malformations: Secondary | ICD-10-CM | POA: Diagnosis not present

## 2022-12-05 DIAGNOSIS — Z3A19 19 weeks gestation of pregnancy: Secondary | ICD-10-CM | POA: Diagnosis not present

## 2022-12-05 DIAGNOSIS — Z3400 Encounter for supervision of normal first pregnancy, unspecified trimester: Secondary | ICD-10-CM | POA: Diagnosis not present

## 2022-12-11 ENCOUNTER — Other Ambulatory Visit (HOSPITAL_COMMUNITY)
Admission: RE | Admit: 2022-12-11 | Discharge: 2022-12-11 | Disposition: A | Discharge status: Home / Self Care | Payer: 59 | Source: Ambulatory Visit | Attending: Oncology | Admitting: Oncology

## 2022-12-11 ENCOUNTER — Other Ambulatory Visit (HOSPITAL_COMMUNITY)
Admission: RE | Admit: 2022-12-11 | Discharge: 2022-12-11 | Disposition: A | Discharge status: Home / Self Care | Payer: Self-pay | Source: Ambulatory Visit | Attending: Oncology | Admitting: Oncology

## 2022-12-11 DIAGNOSIS — Z006 Encounter for examination for normal comparison and control in clinical research program: Secondary | ICD-10-CM

## 2022-12-11 DIAGNOSIS — Z139 Encounter for screening, unspecified: Secondary | ICD-10-CM | POA: Insufficient documentation

## 2022-12-12 ENCOUNTER — Other Ambulatory Visit (HOSPITAL_COMMUNITY): Payer: Self-pay

## 2022-12-12 DIAGNOSIS — H6063 Unspecified chronic otitis externa, bilateral: Secondary | ICD-10-CM | POA: Diagnosis not present

## 2022-12-12 DIAGNOSIS — H6123 Impacted cerumen, bilateral: Secondary | ICD-10-CM | POA: Diagnosis not present

## 2022-12-12 MED ORDER — CLOTRIMAZOLE-BETAMETHASONE 1-0.05 % EX CREA
TOPICAL_CREAM | CUTANEOUS | 3 refills | Status: DC
  Filled 2022-12-12: qty 15, 30d supply, fill #0

## 2022-12-13 ENCOUNTER — Other Ambulatory Visit: Payer: Self-pay | Admitting: Oncology

## 2022-12-13 DIAGNOSIS — Z139 Encounter for screening, unspecified: Secondary | ICD-10-CM

## 2022-12-24 ENCOUNTER — Other Ambulatory Visit (HOSPITAL_COMMUNITY): Payer: Self-pay

## 2022-12-31 LAB — GENECONNECT MOLECULAR SCREEN: Genetic Analysis Overall Interpretation: NEGATIVE

## 2023-01-14 DIAGNOSIS — Z3482 Encounter for supervision of other normal pregnancy, second trimester: Secondary | ICD-10-CM | POA: Diagnosis not present

## 2023-01-14 DIAGNOSIS — Z3483 Encounter for supervision of other normal pregnancy, third trimester: Secondary | ICD-10-CM | POA: Diagnosis not present

## 2023-01-15 ENCOUNTER — Encounter: Payer: Self-pay | Admitting: Physician Assistant

## 2023-01-15 ENCOUNTER — Other Ambulatory Visit (HOSPITAL_COMMUNITY): Payer: Self-pay

## 2023-01-15 ENCOUNTER — Telehealth: Payer: 59 | Admitting: Physician Assistant

## 2023-01-15 DIAGNOSIS — H60392 Other infective otitis externa, left ear: Secondary | ICD-10-CM | POA: Diagnosis not present

## 2023-01-15 MED ORDER — AMOXICILLIN 875 MG PO TABS
875.0000 mg | ORAL_TABLET | Freq: Two times a day (BID) | ORAL | 0 refills | Status: AC
  Filled 2023-01-15: qty 20, 10d supply, fill #0

## 2023-01-15 NOTE — Progress Notes (Signed)
I have spent 5 minutes in review of e-visit questionnaire, review and updating patient chart, medical decision making and response to patient.   Mia Milan Cody Jacklynn Dehaas, PA-C    

## 2023-01-15 NOTE — Progress Notes (Signed)
E Visit for Ear Pain - Swimmer's Ear  We are sorry that you are not feeling well. Here is how we plan to help!  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  Swimmer's ear is a redness or swelling, irritation, or infection of your outer ear canal. These symptoms usually occur within a few days of swimming. Your ear canal is a tube that goes from the opening of the ear to the eardrum.  When water stays in your ear canal, germs can grow.  This is a painful condition that often happens to children and swimmers of all ages.  It is not contagious and oral antibiotics are not required to treat uncomplicated swimmer's ear.  The usual symptoms include:    Itchiness inside the ear  Redness or a sense of swelling in the ear  Pain when the ear is tugged on when pressure is placed on the ear  Pus draining from the infected ear   I have prescribed Amoxicillin 875 mg one tablet twice daily for 10 days  In certain cases, swimmer's ear may progress to a more serious bacterial infection of the middle or inner ear.  If you have a fever 102 and up and significantly worsening symptoms, this could indicate a more serious infection moving to the middle/inner and needs face to face evaluation in an office by a provider.  Your symptoms should improve over the next 3 days and should resolve in about 7 days.  Be sure to complete ALL of your prescription.  HOME CARE: Wash your hands frequently. If you are prescribed an ear drop, do not place the tip of the bottle on your ear or touch it with your fingers. You can take Acetaminophen 650 mg every 4-6 hours as needed for pain.  If pain is severe or moderate, you can apply a heating pad (set on low) or hot water bottle (wrapped in a towel) to outer ear for 20 minutes.  This will also increase drainage. Avoid ear plugs Do not go swimming until the symptoms are gone Do not use Q-tips After showers, help the water run out by tilting your head  to one side.   GET HELP RIGHT AWAY IF: Fever is over 102.2 degrees. You develop progressive ear pain or hearing loss. Ear symptoms persist longer than 3 days after treatment.  MAKE SURE YOU: Understand these instructions. Will watch your condition. Will get help right away if you are not doing well or get worse.  TO PREVENT SWIMMER'S EAR: Use a bathing cap or custom fitted swim molds to keep your ears dry. Towel off after swimming to dry your ears. Tilt your head or pull your earlobes to allow the water to escape your ear canal. If there is still water in your ears, consider using a hairdryer on the lowest setting.  Thank you for choosing an e-visit.  Your e-visit answers were reviewed by a board certified advanced clinical practitioner to complete your personal care plan. Depending upon the condition, your plan could have included both over the counter or prescription medications.  Please review your pharmacy choice. Make sure the pharmacy is open so you can pick up the prescription now. If there is a problem, you may contact your provider through [employer] and have the prescription routed to another pharmacy.  Your safety is important to [place]. If you have drug allergies check your prescription carefully.   For the next 24 hours you can  use MyChart to ask questions about today's visit, request a non-urgent call back, or ask for a work or school excuse. You will get an email with a survey after your eVisit asking about your experience. We would appreciate your feedback. I hope that your e-visit has been valuable and will aid in your recovery.

## 2023-02-07 DIAGNOSIS — Z23 Encounter for immunization: Secondary | ICD-10-CM | POA: Diagnosis not present

## 2023-02-07 DIAGNOSIS — Z3400 Encounter for supervision of normal first pregnancy, unspecified trimester: Secondary | ICD-10-CM | POA: Diagnosis not present

## 2023-02-07 DIAGNOSIS — Z3A28 28 weeks gestation of pregnancy: Secondary | ICD-10-CM | POA: Diagnosis not present

## 2023-02-07 DIAGNOSIS — Z3480 Encounter for supervision of other normal pregnancy, unspecified trimester: Secondary | ICD-10-CM | POA: Diagnosis not present

## 2023-02-07 DIAGNOSIS — O4443 Low lying placenta NOS or without hemorrhage, third trimester: Secondary | ICD-10-CM | POA: Diagnosis not present

## 2023-02-24 DIAGNOSIS — O99810 Abnormal glucose complicating pregnancy: Secondary | ICD-10-CM | POA: Diagnosis not present

## 2023-03-06 DIAGNOSIS — L299 Pruritus, unspecified: Secondary | ICD-10-CM | POA: Diagnosis not present

## 2023-03-06 DIAGNOSIS — H6121 Impacted cerumen, right ear: Secondary | ICD-10-CM | POA: Diagnosis not present

## 2023-04-04 ENCOUNTER — Other Ambulatory Visit (HOSPITAL_COMMUNITY): Payer: Self-pay

## 2023-04-04 DIAGNOSIS — Z3685 Encounter for antenatal screening for Streptococcus B: Secondary | ICD-10-CM | POA: Diagnosis not present

## 2023-04-04 MED ORDER — SERTRALINE HCL 25 MG PO TABS
25.0000 mg | ORAL_TABLET | Freq: Every day | ORAL | 6 refills | Status: DC
  Filled 2023-04-04: qty 30, 30d supply, fill #0
  Filled 2023-05-09: qty 30, 30d supply, fill #1
  Filled 2023-06-05: qty 30, 30d supply, fill #2

## 2023-04-17 ENCOUNTER — Inpatient Hospital Stay (HOSPITAL_COMMUNITY)
Admission: AD | Admit: 2023-04-17 | Discharge: 2023-04-17 | Disposition: A | Discharge status: Home / Self Care | Payer: 59 | Attending: Family Medicine | Admitting: Family Medicine

## 2023-04-17 ENCOUNTER — Encounter (HOSPITAL_COMMUNITY): Payer: Self-pay

## 2023-04-17 ENCOUNTER — Other Ambulatory Visit: Payer: Self-pay

## 2023-04-17 DIAGNOSIS — Z3A38 38 weeks gestation of pregnancy: Secondary | ICD-10-CM | POA: Diagnosis not present

## 2023-04-17 DIAGNOSIS — O26893 Other specified pregnancy related conditions, third trimester: Secondary | ICD-10-CM

## 2023-04-17 DIAGNOSIS — R030 Elevated blood-pressure reading, without diagnosis of hypertension: Secondary | ICD-10-CM

## 2023-04-17 DIAGNOSIS — O09293 Supervision of pregnancy with other poor reproductive or obstetric history, third trimester: Secondary | ICD-10-CM | POA: Diagnosis not present

## 2023-04-17 DIAGNOSIS — R519 Headache, unspecified: Secondary | ICD-10-CM | POA: Diagnosis present

## 2023-04-17 DIAGNOSIS — O163 Unspecified maternal hypertension, third trimester: Secondary | ICD-10-CM

## 2023-04-17 LAB — URINALYSIS, ROUTINE W REFLEX MICROSCOPIC
Bilirubin Urine: NEGATIVE
Glucose, UA: NEGATIVE mg/dL
Ketones, ur: NEGATIVE mg/dL
Leukocytes,Ua: NEGATIVE
Nitrite: NEGATIVE
Protein, ur: NEGATIVE mg/dL
Specific Gravity, Urine: 1.023 (ref 1.005–1.030)
pH: 5 (ref 5.0–8.0)

## 2023-04-17 LAB — COMPREHENSIVE METABOLIC PANEL WITH GFR
ALT: 26 U/L (ref 0–44)
AST: 23 U/L (ref 15–41)
Albumin: 2.7 g/dL — ABNORMAL LOW (ref 3.5–5.0)
Alkaline Phosphatase: 106 U/L (ref 38–126)
Anion gap: 6 (ref 5–15)
BUN: 9 mg/dL (ref 6–20)
CO2: 22 mmol/L (ref 22–32)
Calcium: 9.4 mg/dL (ref 8.9–10.3)
Chloride: 107 mmol/L (ref 98–111)
Creatinine, Ser: 0.52 mg/dL (ref 0.44–1.00)
GFR, Estimated: >60 mL/min (ref >60)
Glucose, Bld: 94 mg/dL (ref 70–99)
Potassium: 3.9 mmol/L (ref 3.5–5.1)
Sodium: 135 mmol/L (ref 135–145)
Total Bilirubin: 0.4 mg/dL (ref <1.2)
Total Protein: 6.5 g/dL (ref 6.5–8.1)

## 2023-04-17 LAB — CBC
HCT: 34.3 % — ABNORMAL LOW (ref 36.0–46.0)
Hemoglobin: 11.5 g/dL — ABNORMAL LOW (ref 12.0–15.0)
MCH: 27.6 pg (ref 26.0–34.0)
MCHC: 33.5 g/dL (ref 30.0–36.0)
MCV: 82.5 fL (ref 80.0–100.0)
Platelets: 395 K/uL (ref 150–400)
RBC: 4.16 MIL/uL (ref 3.87–5.11)
RDW: 12.9 % (ref 11.5–15.5)
WBC: 11 K/uL — ABNORMAL HIGH (ref 4.0–10.5)
nRBC: 0 % (ref 0.0–0.2)

## 2023-04-17 LAB — PROTEIN / CREATININE RATIO, URINE
Creatinine, Urine: 138 mg/dL
Protein Creatinine Ratio: 0.16 mg/mg{creat} — ABNORMAL HIGH (ref 0.00–0.15)
Total Protein, Urine: 22 mg/dL

## 2023-04-17 NOTE — MAU Provider Note (Signed)
History     CSN: [id number]  Arrival date and time: 04/17/23 1451   None     Chief Complaint  Patient presents with   Hypertension   HPI Patient presenting for evaluation for elevated blood pressures.  She was seen in our clinic today and her blood pressure was normal.  When she got home she took her blood pressure and noticed they were elevated in the 140s/90s.  Has a wrist blood pressure cuff.  Has history of gestational hypertension so wanted to make sure she was not developing gestational hypertension or preeclampsia again.  Patient reports she has had a headache off and on for the past week but it will resolve spontaneously and she does not currently have 1.  Denies any abdominal pain or changes in vision.  No other concerns.  OB History     Gravida  3   Para  1   Term  1   Preterm  0   AB  1   Living  1      SAB  1   IAB  0   Ectopic  0   Multiple  0   Live Births  1           Past Medical History:  Diagnosis Date   Allergic rhinitis    Chlamydia 2008   Medical history non-contributory    PCOS (polycystic ovarian syndrome)    Pregnancy induced hypertension     Past Surgical History:  Procedure Laterality Date   IR ANGIOGRAM SELECTIVE EACH ADDITIONAL VESSEL  06/20/2019   IR ANGIOGRAM SELECTIVE EACH ADDITIONAL VESSEL  06/20/2019   IR ANGIOGRAM SELECTIVE EACH ADDITIONAL VESSEL  06/20/2019   IR ANGIOGRAM SELECTIVE EACH ADDITIONAL VESSEL  06/20/2019   IR ANGIOGRAM SELECTIVE EACH ADDITIONAL VESSEL  06/20/2019   IR ANGIOGRAM SELECTIVE EACH ADDITIONAL VESSEL  06/20/2019   IR ANGIOGRAM SELECTIVE EACH ADDITIONAL VESSEL  06/20/2019   IR ANGIOGRAM VISCERAL SELECTIVE  06/20/2019   IR EMBO ART  VEN HEMORR LYMPH EXTRAV  INC GUIDE ROADMAPPING  06/20/2019   IR US GUIDE VASC ACCESS RIGHT  06/20/2019   WISDOM TOOTH EXTRACTION  2013   no problems with anesthesia    Family History  Problem Relation Age of Onset   Healthy Mother    Hypertension Father    Healthy  Maternal Grandmother    Healthy Maternal Grandfather    Healthy Paternal Grandmother    Healthy Paternal Grandfather    Breast cancer Neg Hx    Cancer - Colon Neg Hx    Cervical cancer Neg Hx    Colon cancer Neg Hx    Ovarian cancer Neg Hx     Social History   Tobacco Use   Smoking status: Never   Smokeless tobacco: Never  Vaping Use   Vaping status: Never Used  Substance Use Topics   Alcohol use: No   Drug use: No    Allergies:  Allergies  Allergen Reactions   Shellfish Allergy Anaphylaxis   Shellfish Allergy Swelling    Medications Prior to Admission  Medication Sig Dispense Refill Last Dose/Taking   Prenatal Vit-Fe Fumarate-FA (PRENATAL MULTIVITAMIN) TABS tablet Take 1 tablet by mouth daily at 12 noon.   04/17/2023   ciprofloxacin-dexamethasone (CIPRODEX) OTIC suspension Place 4 drops into the left ear 2 (two) times daily. For 7 days 7.5 mL 0    clotrimazole-betamethasone (LOTRISONE) cream Apply to affected ear once or twice weekly if needed for  itching, irritation 15 g 3  fluticasone (GNP FLUTICASONE PROPIONATE) 50 MCG/ACT nasal spray Place 2 sprays into both nostrils once daily. 16 g 12    ibuprofen (ADVIL) 600 MG tablet Take 1 tablet (600 mg total) by mouth every 6 (six) hours. 30 tablet 0    Semaglutide-Weight Management (WEGOVY) 0.5 MG/0.5ML SOAJ Inject 0.5 mg into the skin once a week. 2 mL 0    Semaglutide-Weight Management (WEGOVY) 1.7 MG/0.75ML SOAJ Inject 1.7 mg into the skin once a week as directed 3 mL 3    Semaglutide-Weight Management (WEGOVY) 1.7 MG/0.75ML SOAJ Inject 1.7 mg into the skin once a week. 3 mL 0    Semaglutide-Weight Management (WEGOVY) 2.4 MG/0.75ML SOAJ Inject 2.4 mg into the skin once a week as directed 9 mL 0    sertraline (ZOLOFT) 25 MG tablet Take 1 tablet (25 mg total) by mouth daily. 30 tablet 6    sertraline (ZOLOFT) 50 MG tablet Take 1 tablet (50 mg total) by mouth daily. 30 tablet 3    triamcinolone ointment (KENALOG) 0.1 % Apply  1 application topically 2 (two) times daily for 14 days 45 g 0     Review of Systems  Constitutional:  Negative for chills and fever.  Gastrointestinal:  Negative for abdominal pain, nausea and vomiting.  Genitourinary:  Negative for vaginal bleeding and vaginal discharge.  Neurological:  Positive for headaches.  All other systems reviewed and are negative.  Physical Exam   Blood pressure 129/84, pulse (!) 101, temperature 98.1 F (36.7 C), temperature source Oral, resp. rate 18, SpO2 100%, unknown if currently breastfeeding.  Physical Exam Vitals reviewed.  Constitutional:      Appearance: Normal appearance. She is normal weight.  HENT:     Right Ear: External ear normal.     Left Ear: External ear normal.     Nose: Nose normal.     Mouth/Throat:     Mouth: Mucous membranes are moist.  Eyes:     Pupils: Pupils are equal, round, and reactive to light.  Cardiovascular:     Rate and Rhythm: Normal rate and regular rhythm.  Pulmonary:     Effort: Pulmonary effort is normal.  Abdominal:     Palpations: Abdomen is soft.     Tenderness: There is no abdominal tenderness.     Comments: Gravid  Musculoskeletal:        General: Normal range of motion.     Cervical back: Normal range of motion.  Skin:    General: Skin is warm.     Capillary Refill: Capillary refill takes less than 2 seconds.  Neurological:     General: No focal deficit present.     Mental Status: She is alert.  Psychiatric:        Mood and Affect: Mood normal.     MAU Course  Procedures  MDM CBC CMP PC ratio NST  Assessment and Plan  Samantha Bailey is a 32 yo G3P1011 @ 49 w0d presenting for concern for elevated blood pressures.  Elevated BP Patient with elevated blood pressures at home.  Reports blood pressure in the clinic today was completely normal.  On arrival blood pressures have been normal.  Patient has had a headache off and on but it responds to treatment or will resolve  spontaneously on its own.  CMP within normal limits.  CBC showing mild anemia but otherwise normal.  PC ratio 0.16.  Believe that the elevated blood pressure is likely due to the use of a wrist cuff instead  of a upper arm blood pressure cuff.  Discussed getting an upper arm cuff and continued monitoring.  Patient will do this.  Discussed tricked return precautions including elevated blood pressures, headache that does not respond to Tylenol, right upper quadrant pain, changes in vision, considerably worsening of swelling in the hands or feet.   Celedonio Savage 04/17/2023, 5:09 PM

## 2023-04-17 NOTE — MAU Note (Signed)
.  Salayah Loos is a 32 y.o. at [redacted]w[redacted]d here in MAU reporting: hx GHTN with previous pregancy so has been monitoring pressures at home. Pressures at home this morning were in the 140s/90s. She checked them because she had a headache. Has been experiencing floaters for a couple weeks. Noticed some increased swelling in hands and feet. Had a normal prenatal visit this morning with a normal BP, but was advised to come to be checked.   Onset of complaint: this morning Pain score: 5/10 headache Vitals:   04/17/23 1538 04/17/23 1539  BP: 124/85   Pulse: (!) 120   Resp: 18   Temp: 98.1 F (36.7 C)   SpO2:  100%     FHT:150 Lab orders placed from triage: UA

## 2023-04-17 NOTE — Discharge Instructions (Signed)
It was a pleasure taking care of you today.  Your blood pressures were normal throughout your stay in the MAU.  Your lab work to look for preeclampsia came back normal.  I recommend you get a blood pressure cuff that goes up around the arm.  You can go to a pharmacy and get this or your OB/GYN provider may have a cuff to give you.  If your symptoms return or worsen or you have any other concerns please feel free to return.  Hope you have a wonderful night!

## 2023-04-23 ENCOUNTER — Telehealth: Payer: Commercial Managed Care - PPO | Admitting: Family Medicine

## 2023-04-23 DIAGNOSIS — H6692 Otitis media, unspecified, left ear: Secondary | ICD-10-CM

## 2023-04-23 MED ORDER — AMOXICILLIN 500 MG PO CAPS: 500.0000 mg | ORAL_CAPSULE | Freq: Three times a day (TID) | ORAL | 0 refills | Status: DC

## 2023-04-23 NOTE — L&D Delivery Note (Signed)
 Operative Delivery Note At 1:01 PM a viable female was delivered via Vaginal, Spontaneous.  Presentation: vertex; Position: Left,, Anterior; Station: +3. Baby was OP prior to pushing. I was able to manually rotate the head to OA. Then started to push. Over just two contractions, head delivered without complication. Shoulders did not come with gentle downward traction.   Second maneuver: ,  McRoberts, gentle downward traction Third maneuver: ,  Subrapubic pressure, gentl downwar traction Fourth maneuver: Corkscrew ultimately allowed delivery of baby   Verbal consent: obtained from patient.  APGAR: 6, 9; weight 10 lb 1.2 oz (4570 g).   Placenta status: routine .   Cord:  with the following complications: .  Cord pH: sent  Anesthesia:  CLE Episiotomy:  None Lacerations:  1st degree (barely) Suture Repair: 3.0 vicryl Est. Blood Loss (mL):  202cc  It's a boy - Owen!!   Mom to postpartum.  Baby to Couplet care / Skin to Skin.  Kelly Nest Kaleb Sek 04/24/2023, 1:22 PM

## 2023-04-23 NOTE — Progress Notes (Signed)
 E-Visit for Ear Pain - Acute Otitis Media   We are sorry that you are not feeling well. Here is how we plan to help!  Based on what you have shared with me it looks like you have Acute Otitis Media.  Acute Otitis Media is an infection of the middle or inner ear. This type of infection can cause redness, inflammation, and fluid buildup behind the tympanic membrane (ear drum).  The usual symptoms include: Earache/Pain Fever Upper respiratory symptoms Lack of energy/Fatigue/Malaise Slight hearing loss gradually worsening- if the inner ear fills with fluid What causes middle ear infections? Most middle ear infections occur when an infection such as a cold, leads to a build-up of mucus in the middle ear and causes the Eustachian tube (a thin tube that runs from the middle ear to the back of the nose) to become swollen or blocked.   This means mucus can't drain away properly, making it easier for an infection to spread into the middle ear.  How middle ear infections are treated: Most ear infections clear up within three to five days and don't need any specific treatment. If necessary, tylenol  or ibuprofen  should be used to relieve pain and a high temperature.  If you develop a fever higher than 102, or any significantly worsening symptoms, this could indicate a more serious infection moving to the middle/inner and needs face to face evaluation in an office by a provider.   Antibiotics aren't routinely used to treat middle ear infections, although they may occasionally be prescribed if symptoms persist or are particularly severe. Given your presentation,   I have prescribed Amoxicillin  500 mg one tablet three times daily for 10 days     Your symptoms should improve over the next 3 days and should resolve in about 7 days. Be sure to complete ALL of the prescription(s) given.  HOME CARE: Wash your hands frequently. If you are prescribed an ear drop, do not place the tip of the bottle on your  ear or touch it with your fingers. You can take Acetaminophen  650 mg every 4-6 hours as needed for pain.  If pain is severe or moderate, you can apply a heating pad (set on low) or hot water bottle (wrapped in a towel) to outer ear for 20 minutes.  This will also increase drainage.  GET HELP RIGHT AWAY IF: Fever is over 102.2 degrees. You develop progressive ear pain or hearing loss. Ear symptoms persist longer than 3 days after treatment.  MAKE SURE YOU: Understand these instructions. Will watch your condition. Will get help right away if you are not doing well or get worse.  Thank you for choosing an e-visit.  Your e-visit answers were reviewed by a board certified advanced clinical practitioner to complete your personal care plan. Depending upon the condition, your plan could have included both over the counter or prescription medications.  Please review your pharmacy choice. Make sure the pharmacy is open so you can pick up the prescription now. If there is a problem, you may contact your provider through [employer] and have the prescription routed to another pharmacy.  Your safety is important to us . If you have drug allergies check your prescription carefully.   For the next 24 hours you can use MyChart to ask questions about today's visit, request a non-urgent call back, or ask for a work or school excuse. You will get an email with a survey after your eVisit asking about your experience. We would appreciate your  feedback. I hope that your e-visit has been valuable and will aid in your recovery.  I have spent 5 minutes in review of e-visit questionnaire, review and updating patient chart, medical decision making and response to patient.   Roosvelt Mater, PA-C

## 2023-04-24 ENCOUNTER — Inpatient Hospital Stay (HOSPITAL_COMMUNITY): Payer: No Typology Code available for payment source | Admitting: Anesthesiology

## 2023-04-24 ENCOUNTER — Encounter (HOSPITAL_COMMUNITY): Payer: Self-pay | Admitting: Obstetrics and Gynecology

## 2023-04-24 ENCOUNTER — Inpatient Hospital Stay (HOSPITAL_COMMUNITY)
Admission: RE | Admit: 2023-04-24 | Discharge: 2023-04-25 | DRG: 807 | Disposition: A | Discharge status: Home / Self Care | Payer: No Typology Code available for payment source | Attending: Obstetrics and Gynecology | Admitting: Obstetrics and Gynecology

## 2023-04-24 ENCOUNTER — Other Ambulatory Visit: Payer: Self-pay

## 2023-04-24 DIAGNOSIS — O9902 Anemia complicating childbirth: Principal | ICD-10-CM | POA: Diagnosis present

## 2023-04-24 DIAGNOSIS — O9962 Diseases of the digestive system complicating childbirth: Secondary | ICD-10-CM | POA: Diagnosis present

## 2023-04-24 DIAGNOSIS — K219 Gastro-esophageal reflux disease without esophagitis: Secondary | ICD-10-CM | POA: Diagnosis not present

## 2023-04-24 DIAGNOSIS — S42022A Displaced fracture of shaft of left clavicle, initial encounter for closed fracture: Secondary | ICD-10-CM | POA: Diagnosis not present

## 2023-04-24 DIAGNOSIS — O99214 Obesity complicating childbirth: Secondary | ICD-10-CM | POA: Diagnosis not present

## 2023-04-24 DIAGNOSIS — S40012A Contusion of left shoulder, initial encounter: Secondary | ICD-10-CM | POA: Diagnosis not present

## 2023-04-24 DIAGNOSIS — Z8249 Family history of ischemic heart disease and other diseases of the circulatory system: Secondary | ICD-10-CM

## 2023-04-24 DIAGNOSIS — Z3A39 39 weeks gestation of pregnancy: Secondary | ICD-10-CM | POA: Diagnosis not present

## 2023-04-24 DIAGNOSIS — O26893 Other specified pregnancy related conditions, third trimester: Secondary | ICD-10-CM | POA: Diagnosis not present

## 2023-04-24 DIAGNOSIS — Z3490 Encounter for supervision of normal pregnancy, unspecified, unspecified trimester: Principal | ICD-10-CM

## 2023-04-24 LAB — CBC
HCT: 36.1 % (ref 36.0–46.0)
Hemoglobin: 12.1 g/dL (ref 12.0–15.0)
MCH: 27.4 pg (ref 26.0–34.0)
MCHC: 33.5 g/dL (ref 30.0–36.0)
MCV: 81.9 fL (ref 80.0–100.0)
Platelets: 407 10*3/uL — ABNORMAL HIGH (ref 150–400)
RBC: 4.41 MIL/uL (ref 3.87–5.11)
RDW: 12.7 % (ref 11.5–15.5)
WBC: 11.1 10*3/uL — ABNORMAL HIGH (ref 4.0–10.5)
nRBC: 0 % (ref 0.0–0.2)

## 2023-04-24 LAB — TYPE AND SCREEN
ABO/RH(D): A POS
Antibody Screen: NEGATIVE

## 2023-04-24 LAB — RPR: RPR Ser Ql: NONREACTIVE

## 2023-04-24 MED ORDER — OXYTOCIN BOLUS FROM INFUSION
333.0000 mL | Freq: Once | INTRAVENOUS | Status: AC
  Administered 2023-04-24: 333 mL via INTRAVENOUS

## 2023-04-24 MED ORDER — DIPHENHYDRAMINE HCL 50 MG/ML IJ SOLN: 12.5000 mg | INTRAMUSCULAR | Status: DC | PRN

## 2023-04-24 MED ORDER — FENTANYL-BUPIVACAINE-NACL 0.5-0.125-0.9 MG/250ML-% EP SOLN
12.0000 mL/h | EPIDURAL | Status: DC | PRN
  Administered 2023-04-24: 12 mL/h via EPIDURAL
  Filled 2023-04-24: qty 250

## 2023-04-24 MED ORDER — EPHEDRINE 5 MG/ML INJ: 10.0000 mg | INTRAVENOUS | Status: DC | PRN

## 2023-04-24 MED ORDER — OXYCODONE HCL 5 MG PO TABS: 10.0000 mg | ORAL_TABLET | ORAL | Status: DC | PRN

## 2023-04-24 MED ORDER — BENZOCAINE-MENTHOL 20-0.5 % EX AERO: 1.0000 | INHALATION_SPRAY | CUTANEOUS | Status: DC | PRN

## 2023-04-24 MED ORDER — DIBUCAINE (PERIANAL) 1 % EX OINT: 1.0000 | TOPICAL_OINTMENT | CUTANEOUS | Status: DC | PRN

## 2023-04-24 MED ORDER — DIPHENHYDRAMINE HCL 50 MG/ML IJ SOLN
12.5000 mg | INTRAMUSCULAR | Status: DC | PRN
  Administered 2023-04-24: 12.5 mg via INTRAVENOUS
  Filled 2023-04-24: qty 1

## 2023-04-24 MED ORDER — ACETAMINOPHEN 325 MG PO TABS
650.0000 mg | ORAL_TABLET | ORAL | Status: DC | PRN
  Administered 2023-04-24 – 2023-04-25 (×3): 650 mg via ORAL
  Filled 2023-04-24 (×3): qty 2

## 2023-04-24 MED ORDER — ONDANSETRON HCL 4 MG/2ML IJ SOLN: 4.0000 mg | INTRAMUSCULAR | Status: DC | PRN

## 2023-04-24 MED ORDER — LACTATED RINGERS IV SOLN
500.0000 mL | Freq: Once | INTRAVENOUS | Status: AC
  Administered 2023-04-24: 500 mL via INTRAVENOUS

## 2023-04-24 MED ORDER — FENTANYL-BUPIVACAINE-NACL 0.5-0.125-0.9 MG/250ML-% EP SOLN: 12.0000 mL/h | EPIDURAL | Status: DC | PRN

## 2023-04-24 MED ORDER — HYDROXYZINE HCL 50 MG PO TABS: 50.0000 mg | ORAL_TABLET | Freq: Four times a day (QID) | ORAL | Status: DC | PRN

## 2023-04-24 MED ORDER — BUPIVACAINE HCL (PF) 0.25 % IJ SOLN
INTRAMUSCULAR | Status: DC | PRN
  Administered 2023-04-24: 8 mL via EPIDURAL

## 2023-04-24 MED ORDER — FENTANYL CITRATE (PF) 100 MCG/2ML IJ SOLN
INTRAMUSCULAR | Status: DC | PRN
  Administered 2023-04-24: 100 ug via EPIDURAL

## 2023-04-24 MED ORDER — IBUPROFEN 600 MG PO TABS
600.0000 mg | ORAL_TABLET | Freq: Four times a day (QID) | ORAL | Status: DC
  Administered 2023-04-24 – 2023-04-25 (×4): 600 mg via ORAL
  Filled 2023-04-24 (×4): qty 1

## 2023-04-24 MED ORDER — TERBUTALINE SULFATE 1 MG/ML IJ SOLN: 0.2500 mg | Freq: Once | INTRAMUSCULAR | Status: DC | PRN

## 2023-04-24 MED ORDER — ZOLPIDEM TARTRATE 5 MG PO TABS: 5.0000 mg | ORAL_TABLET | Freq: Every evening | ORAL | Status: DC | PRN

## 2023-04-24 MED ORDER — SOD CITRATE-CITRIC ACID 500-334 MG/5ML PO SOLN
30.0000 mL | ORAL | Status: DC | PRN
  Filled 2023-04-24: qty 30

## 2023-04-24 MED ORDER — PHENYLEPHRINE 80 MCG/ML (10ML) SYRINGE FOR IV PUSH (FOR BLOOD PRESSURE SUPPORT): 80.0000 ug | PREFILLED_SYRINGE | INTRAVENOUS | Status: DC | PRN

## 2023-04-24 MED ORDER — WITCH HAZEL-GLYCERIN EX PADS: 1.0000 | MEDICATED_PAD | CUTANEOUS | Status: DC | PRN

## 2023-04-24 MED ORDER — SERTRALINE HCL 25 MG PO TABS
25.0000 mg | ORAL_TABLET | Freq: Every day | ORAL | Status: DC
  Filled 2023-04-24: qty 1

## 2023-04-24 MED ORDER — HYDROXYZINE HCL 50 MG PO TABS
25.0000 mg | ORAL_TABLET | Freq: Once | ORAL | Status: AC
  Administered 2023-04-24: 25 mg via ORAL
  Filled 2023-04-24: qty 1

## 2023-04-24 MED ORDER — OXYTOCIN-SODIUM CHLORIDE 30-0.9 UT/500ML-% IV SOLN
1.0000 m[IU]/min | INTRAVENOUS | Status: DC
  Administered 2023-04-24: 2 m[IU]/min via INTRAVENOUS
  Filled 2023-04-24: qty 500

## 2023-04-24 MED ORDER — SIMETHICONE 80 MG PO CHEW: 80.0000 mg | CHEWABLE_TABLET | ORAL | Status: DC | PRN

## 2023-04-24 MED ORDER — TRANEXAMIC ACID-NACL 1000-0.7 MG/100ML-% IV SOLN
INTRAVENOUS | Status: AC
  Filled 2023-04-24: qty 100

## 2023-04-24 MED ORDER — LIDOCAINE HCL (PF) 1 % IJ SOLN: 30.0000 mL | INTRAMUSCULAR | Status: DC | PRN

## 2023-04-24 MED ORDER — TRANEXAMIC ACID-NACL 1000-0.7 MG/100ML-% IV SOLN
1000.0000 mg | INTRAVENOUS | Status: AC
  Administered 2023-04-24: 1000 mg via INTRAVENOUS

## 2023-04-24 MED ORDER — OXYTOCIN-SODIUM CHLORIDE 30-0.9 UT/500ML-% IV SOLN
2.5000 [IU]/h | INTRAVENOUS | Status: DC
  Administered 2023-04-24: 2.5 [IU]/h via INTRAVENOUS

## 2023-04-24 MED ORDER — LACTATED RINGERS IV SOLN: INTRAVENOUS | Status: DC

## 2023-04-24 MED ORDER — TETANUS-DIPHTH-ACELL PERTUSSIS 5-2.5-18.5 LF-MCG/0.5 IM SUSY
0.5000 mL | PREFILLED_SYRINGE | Freq: Once | INTRAMUSCULAR | Status: DC
Start: 2023-04-25 — End: 2023-04-25
  Filled 2023-04-24: qty 0.5

## 2023-04-24 MED ORDER — DIPHENHYDRAMINE HCL 25 MG PO CAPS: 25.0000 mg | ORAL_CAPSULE | Freq: Four times a day (QID) | ORAL | Status: DC | PRN

## 2023-04-24 MED ORDER — LACTATED RINGERS IV SOLN: 500.0000 mL | INTRAVENOUS | Status: DC | PRN

## 2023-04-24 MED ORDER — PRENATAL MULTIVITAMIN CH
1.0000 | ORAL_TABLET | Freq: Every day | ORAL | Status: DC
Start: 2023-04-25 — End: 2023-04-25
  Administered 2023-04-25: 1 via ORAL
  Filled 2023-04-24: qty 1

## 2023-04-24 MED ORDER — ONDANSETRON HCL 4 MG/2ML IJ SOLN
4.0000 mg | Freq: Four times a day (QID) | INTRAMUSCULAR | Status: DC | PRN
  Administered 2023-04-24: 4 mg via INTRAVENOUS
  Filled 2023-04-24: qty 2

## 2023-04-24 MED ORDER — SENNOSIDES-DOCUSATE SODIUM 8.6-50 MG PO TABS
2.0000 | ORAL_TABLET | ORAL | Status: DC
  Administered 2023-04-24 – 2023-04-25 (×2): 2 via ORAL
  Filled 2023-04-24 (×2): qty 2

## 2023-04-24 MED ORDER — OXYCODONE HCL 5 MG PO TABS
5.0000 mg | ORAL_TABLET | ORAL | Status: DC | PRN
Start: 2023-04-24 — End: 2023-04-25

## 2023-04-24 MED ORDER — OXYCODONE-ACETAMINOPHEN 5-325 MG PO TABS: 1.0000 | ORAL_TABLET | ORAL | Status: DC | PRN

## 2023-04-24 MED ORDER — OXYCODONE-ACETAMINOPHEN 5-325 MG PO TABS: 2.0000 | ORAL_TABLET | ORAL | Status: DC | PRN

## 2023-04-24 MED ORDER — ACETAMINOPHEN 325 MG PO TABS: 650.0000 mg | ORAL_TABLET | ORAL | Status: DC | PRN

## 2023-04-24 MED ORDER — LIDOCAINE HCL (PF) 1 % IJ SOLN
INTRAMUSCULAR | Status: DC | PRN
  Administered 2023-04-24: 8 mL via EPIDURAL

## 2023-04-24 MED ORDER — FENTANYL CITRATE (PF) 100 MCG/2ML IJ SOLN
50.0000 ug | INTRAMUSCULAR | Status: DC | PRN
  Administered 2023-04-24: 100 ug via INTRAVENOUS
  Filled 2023-04-24: qty 2

## 2023-04-24 MED ORDER — MISOPROSTOL 25 MCG QUARTER TABLET
25.0000 ug | ORAL_TABLET | ORAL | Status: DC | PRN
Start: 2023-04-24 — End: 2023-04-24

## 2023-04-24 MED ORDER — COCONUT OIL OIL: 1.0000 | TOPICAL_OIL | Status: DC | PRN

## 2023-04-24 MED ORDER — ONDANSETRON HCL 4 MG PO TABS: 4.0000 mg | ORAL_TABLET | ORAL | Status: DC | PRN

## 2023-04-24 NOTE — H&P (Signed)
 Samantha Bailey is a 33 y.o. female presenting for eIOL.Hx PIH with prior pregnancy and was on asa this pregnancy. She had a LLP that resolved. 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 She is expecting a RR boy. Prior SVD 7#4 Lorinda).    OB History     Gravida  3   Para  1   Term  1   Preterm  0   AB  1   Living  1      SAB  1   IAB  0   Ectopic  0   Multiple  0   Live Births  1          Past Medical History:  Diagnosis Date   Allergic rhinitis    Chlamydia 2008   Medical history non-contributory    PCOS (polycystic ovarian syndrome)    Pregnancy induced hypertension    Past Surgical History:  Procedure Laterality Date   IR ANGIOGRAM SELECTIVE EACH ADDITIONAL VESSEL  06/20/2019   IR ANGIOGRAM SELECTIVE EACH ADDITIONAL VESSEL  06/20/2019   IR ANGIOGRAM SELECTIVE EACH ADDITIONAL VESSEL  06/20/2019   IR ANGIOGRAM SELECTIVE EACH ADDITIONAL VESSEL  06/20/2019   IR ANGIOGRAM SELECTIVE EACH ADDITIONAL VESSEL  06/20/2019   IR ANGIOGRAM SELECTIVE EACH ADDITIONAL VESSEL  06/20/2019   IR ANGIOGRAM SELECTIVE EACH ADDITIONAL VESSEL  06/20/2019   IR ANGIOGRAM VISCERAL SELECTIVE  06/20/2019   IR EMBO ART  VEN HEMORR LYMPH EXTRAV  INC GUIDE ROADMAPPING  06/20/2019   IR US  GUIDE VASC ACCESS RIGHT  06/20/2019   WISDOM TOOTH EXTRACTION  2013   no problems with anesthesia   Family History: family history includes Healthy in her maternal grandfather, maternal grandmother, mother, paternal grandfather, and paternal grandmother; Hypertension in her father. Social History:  reports that she has never smoked. She has never used smokeless tobacco. She reports that she does not drink alcohol and does not use drugs.     Maternal Diabetes: No Genetic Screening: Normal Maternal Ultrasounds/Referrals: Normal Fetal Ultrasounds or other Referrals:  None Maternal Substance Abuse:  No Significant Maternal Medications:  None Significant Maternal Lab Results:  Group B Strep negative Number  of Prenatal Visits:greater than 3 verified prenatal visits Review of Systems History Dilation: 5 Effacement (%): 80 Station: -1 Exam by:: Davis,RN Blood pressure 131/76, pulse 90, temperature 98.9 F (37.2 C), temperature source Oral, resp. rate 16, height 5' 2 (1.575 m), weight 94.3 kg, SpO2 100%, unknown if currently breastfeeding. Exam Physical Exam  NAD, A&O NWOB Abd soft, nondistended, gravid SVE 5/80/-1, AROM clear fluid  Prenatal labs: ABO, Rh: --/--/A POS (01/02 0630) Antibody: NEG (01/02 0630) Rubella: Immune (06/05 0000) RPR: Nonreactive (06/05 0000)  HBsAg: Negative (06/05 0000)  HIV: Non-reactive (06/05 0000)  GBS:   Negative  Assessment/Plan: 33 yo G3P1011 @ 39.0 wga presenting for IOL s/s elective. Cervix favorable.  On Pitocin  and s/p AROM. GBS negative.     Kelly Delon Milian 04/24/2023, 9:19 AM

## 2023-04-24 NOTE — Lactation Note (Signed)
 This note was copied from a baby's chart. Lactation Consultation Note  Patient Name: Samantha Bailey Date: 04/24/2023 Age:31 hours  RN ETTER Ards) will let [facility] services knows if MOB would like to be seen by [facility] services on MBU, MOB had declined LC services on L&D.    Maternal Data    Feeding    LATCH Score                    Lactation Tools Discussed/Used    Interventions    Discharge    Consult Status      [name] 04/24/2023, 11:26 PM

## 2023-04-24 NOTE — Anesthesia Preprocedure Evaluation (Signed)
 Anesthesia Evaluation  Patient identified by MRN, date of birth, ID band Patient awake    Reviewed: Allergy  & Precautions, H&P , Patient's Chart, lab work & pertinent test results  Airway Mallampati: I  TM Distance: >3 FB Neck ROM: Full    Dental  (+) Teeth Intact, Dental Advisory Given   Pulmonary neg pulmonary ROS   Pulmonary exam normal breath sounds clear to auscultation       Cardiovascular Exercise Tolerance: Good hypertension, Pt. on medications negative cardio ROS Normal cardiovascular exam Rhythm:Regular Rate:Normal  Gestaional - not on any Rx   Neuro/Psych  PSYCHIATRIC DISORDERS Anxiety     negative neurological ROS  negative psych ROS   GI/Hepatic negative GI ROS, Neg liver ROS,GERD  ,,  Endo/Other  negative endocrine ROS  Obesity PCOS  Renal/GU negative Renal ROS  negative genitourinary   Musculoskeletal negative musculoskeletal ROS (+)    Abdominal  (+) + obese  Peds  Hematology negative hematology ROS (+) Blood dyscrasia, anemia   Anesthesia Other Findings   Reproductive/Obstetrics negative OB ROS (+) Pregnancy                             Anesthesia Physical Anesthesia Plan  ASA: 2  Anesthesia Plan: Epidural   Post-op Pain Management: Minimal or no pain anticipated   Induction: Intravenous  PONV Risk Score and Plan: 3 and Treatment may vary due to age or medical condition  Airway Management Planned: Natural Airway and Simple Face Mask  Additional Equipment: None  Intra-op Plan:   Post-operative Plan:   Informed Consent: I have reviewed the patients History and Physical, chart, labs and discussed the procedure including the risks, benefits and alternatives for the proposed anesthesia with the patient or authorized representative who has indicated his/her understanding and acceptance.       Plan Discussed with: Anesthesiologist and CRNA  Anesthesia Plan  Comments:         Anesthesia Quick Evaluation

## 2023-04-24 NOTE — Plan of Care (Signed)
 Problem: Education: Goal: Knowledge of Childbirth will improve Outcome: Progressing Goal: Ability to make informed decisions regarding treatment and plan of care will improve Outcome: Progressing Goal: Ability to state and carry out methods to decrease the pain will improve Outcome: Progressing Goal: Individualized Educational Video(s) Outcome: Progressing   Problem: Coping: Goal: Ability to verbalize concerns and feelings about labor and delivery will improve Outcome: Progressing   Problem: Life Cycle: Goal: Ability to make normal progression through stages of labor will improve Outcome: Progressing Goal: Ability to effectively push during vaginal delivery will improve Outcome: Progressing   Problem: Role Relationship: Goal: Will demonstrate positive interactions with the child Outcome: Progressing   Problem: Safety: Goal: Risk of complications during labor and delivery will decrease Outcome: Progressing   Problem: Pain Management: Goal: Relief or control of pain from uterine contractions will improve Outcome: Progressing   Problem: Education: Goal: Knowledge of General Education information will improve Description: Including pain rating scale, medication(s)/side effects and non-pharmacologic comfort measures Outcome: Progressing   Problem: Health Behavior/Discharge Planning: Goal: Ability to manage health-related needs will improve Outcome: Progressing   Problem: Clinical Measurements: Goal: Ability to maintain clinical measurements within normal limits will improve Outcome: Progressing Goal: Will remain free from infection Outcome: Progressing Goal: Diagnostic test results will improve Outcome: Progressing Goal: Respiratory complications will improve Outcome: Progressing Goal: Cardiovascular complication will be avoided Outcome: Progressing   Problem: Activity: Goal: Risk for activity intolerance will decrease Outcome: Progressing   Problem:  Nutrition: Goal: Adequate nutrition will be maintained Outcome: Progressing   Problem: Coping: Goal: Level of anxiety will decrease Outcome: Progressing   Problem: Elimination: Goal: Will not experience complications related to bowel motility Outcome: Progressing Goal: Will not experience complications related to urinary retention Outcome: Progressing   Problem: Pain Management: Goal: General experience of comfort will improve Outcome: Progressing   Problem: Safety: Goal: Ability to remain free from injury will improve Outcome: Progressing   Problem: Skin Integrity: Goal: Risk for impaired skin integrity will decrease Outcome: Progressing   Problem: Education: Goal: Knowledge of Childbirth will improve Outcome: Progressing Goal: Ability to make informed decisions regarding treatment and plan of care will improve Outcome: Progressing Goal: Ability to state and carry out methods to decrease the pain will improve Outcome: Progressing Goal: Individualized Educational Video(s) Outcome: Progressing   Problem: Coping: Goal: Ability to verbalize concerns and feelings about labor and delivery will improve Outcome: Progressing   Problem: Life Cycle: Goal: Ability to make normal progression through stages of labor will improve Outcome: Progressing Goal: Ability to effectively push during vaginal delivery will improve Outcome: Progressing   Problem: Role Relationship: Goal: Will demonstrate positive interactions with the child Outcome: Progressing   Problem: Safety: Goal: Risk of complications during labor and delivery will decrease Outcome: Progressing   Problem: Pain Management: Goal: Relief or control of pain from uterine contractions will improve Outcome: Progressing   Problem: Education: Goal: Knowledge of General Education information will improve Description: Including pain rating scale, medication(s)/side effects and non-pharmacologic comfort measures Outcome:  Progressing   Problem: Health Behavior/Discharge Planning: Goal: Ability to manage health-related needs will improve Outcome: Progressing   Problem: Clinical Measurements: Goal: Ability to maintain clinical measurements within normal limits will improve Outcome: Progressing Goal: Will remain free from infection Outcome: Progressing Goal: Diagnostic test results will improve Outcome: Progressing Goal: Respiratory complications will improve Outcome: Progressing Goal: Cardiovascular complication will be avoided Outcome: Progressing   Problem: Activity: Goal: Risk for activity intolerance will decrease Outcome: Progressing  Problem: Nutrition: Goal: Adequate nutrition will be maintained Outcome: Progressing   Problem: Coping: Goal: Level of anxiety will decrease Outcome: Progressing   Problem: Elimination: Goal: Will not experience complications related to bowel motility Outcome: Progressing Goal: Will not experience complications related to urinary retention Outcome: Progressing   Problem: Pain Management: Goal: General experience of comfort will improve Outcome: Progressing   Problem: Safety: Goal: Ability to remain free from injury will improve Outcome: Progressing   Problem: Skin Integrity: Goal: Risk for impaired skin integrity will decrease Outcome: Progressing

## 2023-04-25 ENCOUNTER — Other Ambulatory Visit (HOSPITAL_COMMUNITY): Payer: Self-pay

## 2023-04-25 LAB — CBC
HCT: 32 % — ABNORMAL LOW (ref 36.0–46.0)
Hemoglobin: 10.6 g/dL — ABNORMAL LOW (ref 12.0–15.0)
MCH: 27.5 pg (ref 26.0–34.0)
MCHC: 33.1 g/dL (ref 30.0–36.0)
MCV: 82.9 fL (ref 80.0–100.0)
Platelets: 342 10*3/uL (ref 150–400)
RBC: 3.86 MIL/uL — ABNORMAL LOW (ref 3.87–5.11)
RDW: 12.9 % (ref 11.5–15.5)
WBC: 13.5 10*3/uL — ABNORMAL HIGH (ref 4.0–10.5)
nRBC: 0 % (ref 0.0–0.2)

## 2023-04-25 MED ORDER — IBUPROFEN 600 MG PO TABS
600.0000 mg | ORAL_TABLET | Freq: Four times a day (QID) | ORAL | 0 refills | Status: DC | PRN
  Filled 2023-04-25: qty 90, 23d supply, fill #0

## 2023-04-25 NOTE — Progress Notes (Signed)
 Post Partum Day 1 Subjective: no complaints, up ad lib, and tolerating PO  Objective: Blood pressure 116/70, pulse 89, temperature 98 F (36.7 C), temperature source Oral, resp. rate 16, height 5' 2 (1.575 m), weight 94.3 kg, SpO2 100%, unknown if currently breastfeeding.  Physical Exam:  General: cooperative Lochia: appropriate Uterine Fundus: firm Incision: healing well DVT Evaluation: No evidence of DVT seen on physical exam.  Recent Labs    04/24/23 0630 04/25/23 0428  HGB 12.1 10.6*  HCT 36.1 32.0*    Assessment/Plan: Discharge home D/W discharge instructions D/W circumcision of newborn boy and risks. She states she understands and agrees.   LOS: 1 day   Shiven Junious E Tome Wilson II, MD 04/25/2023, 6:42 AM

## 2023-04-25 NOTE — Anesthesia Postprocedure Evaluation (Signed)
 Anesthesia Post Note  Patient: Samantha Bailey [facility]  Procedure(s) Performed: AN AD HOC LABOR EPIDURAL     Patient location during evaluation: Mother Baby Anesthesia Type: Epidural Level of consciousness: awake and alert and oriented Pain management: satisfactory to patient Vital Signs Assessment: post-procedure vital signs reviewed and stable Respiratory status: respiratory function stable Cardiovascular status: stable Postop Assessment: no headache, no backache, epidural receding, patient able to bend at knees, no signs of nausea or vomiting, adequate PO intake and able to ambulate Anesthetic complications: no   No notable events documented.  Last Vitals:  Vitals:   04/24/23 2332 04/25/23 0330  BP: 113/72 116/70  Pulse: 97 89  Resp: 19 16  Temp:  36.7 C  SpO2: 97% 100%    Last Pain:  Vitals:   04/25/23 0446  TempSrc:   PainSc: 0-No pain   Pain Goal:                   Luman Holway

## 2023-04-25 NOTE — Lactation Note (Signed)
 This note was copied from a baby's chart. Lactation Consultation Note  Patient Name: Samantha Bailey Unijb'd Date: 04/25/2023 Age:31 hours  Per RN, [name] MOB would like to be seen by [facility] services in the morning but not tonight.   Maternal Data    Feeding Nipple Type: Slow - flow  LATCH Score                    Lactation Tools Discussed/Used    Interventions    Discharge    Consult Status      Samantha Bailey 04/25/2023, 12:29 AM

## 2023-04-25 NOTE — Plan of Care (Signed)
 Problem: Education: Goal: Knowledge of Childbirth will improve Outcome: Adequate for Discharge Goal: Ability to make informed decisions regarding treatment and plan of care will improve Outcome: Adequate for Discharge Goal: Ability to state and carry out methods to decrease the pain will improve Outcome: Adequate for Discharge Goal: Individualized Educational Video(s) Outcome: Adequate for Discharge   Problem: Coping: Goal: Ability to verbalize concerns and feelings about labor and delivery will improve Outcome: Adequate for Discharge   Problem: Life Cycle: Goal: Ability to make normal progression through stages of labor will improve Outcome: Adequate for Discharge Goal: Ability to effectively push during vaginal delivery will improve Outcome: Adequate for Discharge   Problem: Role Relationship: Goal: Will demonstrate positive interactions with the child Outcome: Adequate for Discharge   Problem: Safety: Goal: Risk of complications during labor and delivery will decrease Outcome: Adequate for Discharge   Problem: Pain Management: Goal: Relief or control of pain from uterine contractions will improve Outcome: Adequate for Discharge   Problem: Education: Goal: Knowledge of General Education information will improve Description: Including pain rating scale, medication(s)/side effects and non-pharmacologic comfort measures Outcome: Adequate for Discharge   Problem: Health Behavior/Discharge Planning: Goal: Ability to manage health-related needs will improve Outcome: Adequate for Discharge   Problem: Clinical Measurements: Goal: Ability to maintain clinical measurements within normal limits will improve Outcome: Adequate for Discharge Goal: Will remain free from infection Outcome: Adequate for Discharge Goal: Diagnostic test results will improve Outcome: Adequate for Discharge Goal: Respiratory complications will improve Outcome: Adequate for Discharge Goal:  Cardiovascular complication will be avoided Outcome: Adequate for Discharge   Problem: Activity: Goal: Risk for activity intolerance will decrease Outcome: Adequate for Discharge   Problem: Nutrition: Goal: Adequate nutrition will be maintained Outcome: Adequate for Discharge   Problem: Coping: Goal: Level of anxiety will decrease Outcome: Adequate for Discharge   Problem: Elimination: Goal: Will not experience complications related to bowel motility Outcome: Adequate for Discharge Goal: Will not experience complications related to urinary retention Outcome: Adequate for Discharge   Problem: Pain Management: Goal: General experience of comfort will improve Outcome: Adequate for Discharge   Problem: Safety: Goal: Ability to remain free from injury will improve Outcome: Adequate for Discharge   Problem: Skin Integrity: Goal: Risk for impaired skin integrity will decrease Outcome: Adequate for Discharge   Problem: Education: Goal: Knowledge of condition will improve Outcome: Adequate for Discharge   Problem: Activity: Goal: Will verbalize the importance of balancing activity with adequate rest periods Outcome: Adequate for Discharge Goal: Ability to tolerate increased activity will improve Outcome: Adequate for Discharge   Problem: Coping: Goal: Ability to identify and utilize available resources and services will improve Outcome: Adequate for Discharge   Problem: Life Cycle: Goal: Chance of risk for complications during the postpartum period will decrease Outcome: Adequate for Discharge   Problem: Role Relationship: Goal: Ability to demonstrate positive interaction with newborn will improve Outcome: Adequate for Discharge   Problem: Skin Integrity: Goal: Demonstration of wound healing without infection will improve Outcome: Adequate for Discharge   Problem: Education: Goal: Knowledge of condition will improve Outcome: Adequate for Discharge Goal:  Individualized Educational Video(s) Outcome: Adequate for Discharge Goal: Individualized Newborn Educational Video(s) Outcome: Adequate for Discharge   Problem: Activity: Goal: Will verbalize the importance of balancing activity with adequate rest periods Outcome: Adequate for Discharge Goal: Ability to tolerate increased activity will improve Outcome: Adequate for Discharge   Problem: Coping: Goal: Ability to identify and utilize available resources and services will improve Outcome:  Adequate for Discharge   Problem: Life Cycle: Goal: Chance of risk for complications during the postpartum period will decrease Outcome: Adequate for Discharge   Problem: Role Relationship: Goal: Ability to demonstrate positive interaction with newborn will improve Outcome: Adequate for Discharge   Problem: Skin Integrity: Goal: Demonstration of wound healing without infection will improve Outcome: Adequate for Discharge

## 2023-04-25 NOTE — Discharge Summary (Signed)
 Postpartum Discharge Summary  Date of Service updated 04/25/23     Patient Name: Samantha Bailey [facility] DOB: [birth year] MRN: [id number]  Date of admission: 04/24/2023 Delivery date:04/24/2023 Delivering provider: MARNE KELLY NEST Date of discharge: 04/25/2023  Admitting diagnosis: Pregnancy [Z34.90] Intrauterine pregnancy: [redacted]w[redacted]d     Secondary diagnosis:  Principal Problem:   Pregnancy  Additional problems:     Discharge diagnosis: Term Pregnancy Delivered                                              Post partum procedures: Augmentation: AROM and Pitocin  Complications: None  Hospital course: Induction of Labor With Vaginal Delivery   33 y.o. yo [id number] at [redacted]w[redacted]d was admitted to the hospital 04/24/2023 for induction of labor.  Indication for induction: Favorable cervix at term.  Patient had an labor course complicated by Membrane Rupture Time/Date: 8:31 AM,04/24/2023  Delivery Method:Vaginal, Spontaneous Operative Delivery:N/A Episiotomy: None Lacerations:  1st degree;Vaginal Details of delivery can be found in separate delivery note.  Patient had a postpartum course complicated by. Patient is discharged home 04/25/23.  Newborn Data: Birth date:04/24/2023 Birth time:1:01 PM Gender:Female Living status:Living Apgars:6 ,9  Weight:4570 g  Magnesium Sulfate received: No BMZ received: No Rhophylac:No MMR:No T-DaP:Given prenatally Flu:  RSV Vaccine received:  Transfusion:No Immunizations administered: Immunization History  Administered Date(s) Administered   DTaP 12/08/1992, 04/09/1993, 08/02/1993, 12/16/1996   HPV Quadrivalent 02/24/2007, 05/11/2007, 11/17/2007   Hepatitis B 12-08-92, 12/08/1992, 01/14/1994   IPV 12/08/1992, 04/09/1993, 08/02/1993, 12/16/1996   Influenza,inj,Quad PF,6+ Mos 02/03/2017, 04/08/2018   MMR 09/13/1993, 12/16/1996, 10/11/2016   Meningococcal Conjugate 02/24/2007   PPD Test 12/08/2015   Td 04/08/2018   Tdap 02/24/2007, 07/19/2019     Physical exam  Vitals:   04/24/23 1615 04/24/23 2037 04/24/23 2332 04/25/23 0330  BP: 121/72 120/72 113/72 116/70  Pulse: 83 100 97 89  Resp: 18 16 19 16   Temp: 98.4 F (36.9 C) 99.3 F (37.4 C)  98 F (36.7 C)  TempSrc: Oral Oral Axillary Oral  SpO2: 98% 99% 97% 100%  Weight:      Height:       General: alert, cooperative, and no distress Lochia: appropriate Uterine Fundus: firm Incision: Healing well with no significant drainage DVT Evaluation: No evidence of DVT seen on physical exam. Labs: Lab Results  Component Value Date   WBC 13.5 (H) 04/25/2023   HGB 10.6 (L) 04/25/2023   HCT 32.0 (L) 04/25/2023   MCV 82.9 04/25/2023   PLT 342 04/25/2023      Latest Ref Rng & Units 04/17/2023    4:05 PM  CMP  Glucose 70 - 99 mg/dL 94   BUN 6 - 20 mg/dL 9   Creatinine 9.55 - 8.99 mg/dL 9.47   Sodium 864 - 854 mmol/L 135   Potassium 3.5 - 5.1 mmol/L 3.9   Chloride 98 - 111 mmol/L 107   CO2 22 - 32 mmol/L 22   Calcium 8.9 - 10.3 mg/dL 9.4   Total Protein 6.5 - 8.1 g/dL 6.5   Total Bilirubin <8.7 mg/dL 0.4   Alkaline Phos 38 - 126 U/L 106   AST 15 - 41 U/L 23   ALT 0 - 44 U/L 26    Edinburgh Score:    09/29/2021    1:42 PM  Edinburgh Postnatal Depression Scale Screening Tool  I have been  able to laugh and see the funny side of things. 0  I have looked forward with enjoyment to things. 0  I have blamed myself unnecessarily when things went wrong. 0  I have been anxious or worried for no good reason. 2  I have felt scared or panicky for no good reason. 0  Things have been getting on top of me. 0  I have been so unhappy that I have had difficulty sleeping. 0  I have felt sad or miserable. 0  I have been so unhappy that I have been crying. 0  The thought of harming myself has occurred to me. 0  Edinburgh Postnatal Depression Scale Total 2      After visit meds:  Allergies as of 04/25/2023       Reactions   Shellfish Allergy  Anaphylaxis   Shellfish Allergy   Swelling        Medication List     STOP taking these medications    amoxicillin  500 MG capsule Commonly known as: AMOXIL    ciprofloxacin -dexamethasone  OTIC suspension Commonly known as: Ciprodex    clotrimazole -betamethasone  cream Commonly known as: LOTRISONE    fluticasone  50 MCG/ACT nasal spray Commonly known as: GNP Fluticasone  Propionate   triamcinolone  ointment 0.1 % Commonly known as: KENALOG        TAKE these medications    ibuprofen  600 MG tablet Commonly known as: ADVIL  Take 1 tablet (600 mg total) by mouth every 6 (six) hours as needed.   prenatal multivitamin Tabs tablet Take 1 tablet by mouth daily at 12 noon.   sertraline  25 MG tablet Commonly known as: ZOLOFT  Take 1 tablet (25 mg total) by mouth daily. What changed: Another medication with the same name was removed. Continue taking this medication, and follow the directions you see here.         Discharge home in stable condition Infant Feeding: Bottle Infant Disposition:home with mother Discharge instruction: per After Visit Summary and Postpartum booklet. Activity: Advance as tolerated. Pelvic rest for 6 weeks.  Diet: routine diet Anticipated Birth Control: Unsure Postpartum Appointment:6 weeks Additional Postpartum F/U:  Future Appointments:No future appointments. Follow up Visit:      04/25/2023 Lynwood FORBES Curlene PONCE, MD

## 2023-04-25 NOTE — Discharge Instructions (Signed)
 WHAT TO LOOK OUT FOR: Fever of 100.4 or above Mastitis: feels like flu and breasts hurt Infection: increased pain, swelling or redness Blood clots golf ball size or larger Postpartum depression   Congratulations on your newest addition!

## 2023-04-25 NOTE — Progress Notes (Signed)
 CSW received a consult for PPD; and met with MOB at bedside to complete a mental health assessment. CSW entered the room, introduced herself and acknowledged that FOB was present. MOB gave CSW verbal permission to speak about anything while FOB was present. CSW explained her role and the reason for the visit. MOB was polite, easy to engage, receptive to meeting with CSW, and appeared forthcoming.  CSW inquired about MOB's mental health history. 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 MOB reported reaching out to her PCP/OB provider for support once she became pregnant again and was prescribed Sertraline  25mg  for support. MOB reported she will begin the Sertraline  once discharged hospital in support of her mental health during this PP period. MOB denied any mental health history prior to her pregnancies. MOB reported currently feeling good and bonded well with the infant. MOB reported her supports as FOB, her family and FOB's family. CSW provided education regarding the baby blues period vs. perinatal mood disorders, discussed treatment and gave resources for mental health follow up if concerns arise.  CSW recommends self-evaluation during the postpartum time period using the New Mom Checklist from Postpartum Progress and encouraged MOB to contact a medical professional if symptoms are noted at any time. CSW assessed for safety with MOB SI and HI; MOB denied all. CSW did not assess for DV; FOB was present.  CSW asked MOB has she selected a pediatrician for the infant's follow up visits; MOB said [employer]. MOB reported having all essential items for the infant including a carseat, bassinet and crib for safe sleeping. CSW provided review of Sudden Infant Death Syndrome (SIDS) precautions.  CSW identifies no further need for intervention and no barriers to discharge at this time.  Samantha Bailey, [place] Clinical Social  Worker [phone]

## 2023-04-28 NOTE — Anesthesia Procedure Notes (Signed)
 Epidural

## 2023-04-29 DIAGNOSIS — Z6835 Body mass index (BMI) 35.0-35.9, adult: Secondary | ICD-10-CM | POA: Diagnosis not present

## 2023-04-29 DIAGNOSIS — Z713 Dietary counseling and surveillance: Secondary | ICD-10-CM | POA: Diagnosis not present

## 2023-05-05 ENCOUNTER — Other Ambulatory Visit (HOSPITAL_COMMUNITY): Payer: Self-pay

## 2023-05-06 ENCOUNTER — Telehealth (HOSPITAL_COMMUNITY): Payer: Self-pay | Admitting: *Deleted

## 2023-05-06 NOTE — Telephone Encounter (Signed)
 Attempted hospital discharge follow-up call. Left message for patient to return RN call with any questions or concerns. Deforest Hoyles, RN, 05/06/23, [phone]

## 2023-05-08 ENCOUNTER — Inpatient Hospital Stay (HOSPITAL_COMMUNITY): Payer: No Typology Code available for payment source

## 2023-05-14 ENCOUNTER — Other Ambulatory Visit: Payer: Self-pay

## 2023-05-15 ENCOUNTER — Other Ambulatory Visit (HOSPITAL_COMMUNITY): Payer: Self-pay

## 2023-05-15 MED ORDER — WEGOVY 0.25 MG/0.5ML ~~LOC~~ SOAJ
0.2500 mg | SUBCUTANEOUS | 0 refills | Status: DC
  Filled 2023-05-15 – 2023-06-18 (×3): qty 2, 28d supply, fill #0

## 2023-05-21 ENCOUNTER — Other Ambulatory Visit (HOSPITAL_COMMUNITY): Payer: Self-pay

## 2023-05-21 DIAGNOSIS — H60393 Other infective otitis externa, bilateral: Secondary | ICD-10-CM | POA: Diagnosis not present

## 2023-05-21 DIAGNOSIS — H6063 Unspecified chronic otitis externa, bilateral: Secondary | ICD-10-CM | POA: Diagnosis not present

## 2023-05-21 MED ORDER — HYDROCORTISONE-ACETIC ACID 1-2 % OT SOLN
5.0000 [drp] | Freq: Two times a day (BID) | OTIC | 2 refills | Status: AC
  Filled 2023-05-21: qty 10, 20d supply, fill #0
  Filled 2023-06-05 – 2023-06-09 (×3): qty 10, 20d supply, fill #1
  Filled 2023-06-28: qty 10, 20d supply, fill #2

## 2023-05-22 DIAGNOSIS — Z0289 Encounter for other administrative examinations: Secondary | ICD-10-CM

## 2023-05-26 ENCOUNTER — Encounter (INDEPENDENT_AMBULATORY_CARE_PROVIDER_SITE_OTHER): Payer: Self-pay | Admitting: Family Medicine

## 2023-05-26 ENCOUNTER — Ambulatory Visit (INDEPENDENT_AMBULATORY_CARE_PROVIDER_SITE_OTHER): Payer: Commercial Managed Care - PPO | Admitting: Family Medicine

## 2023-05-26 VITALS — BP 112/68 | HR 80 | Temp 98.1°F | Ht 63.0 in | Wt 185.0 lb

## 2023-05-26 DIAGNOSIS — E282 Polycystic ovarian syndrome: Secondary | ICD-10-CM

## 2023-05-26 DIAGNOSIS — E669 Obesity, unspecified: Secondary | ICD-10-CM | POA: Diagnosis not present

## 2023-05-26 DIAGNOSIS — Z6832 Body mass index (BMI) 32.0-32.9, adult: Secondary | ICD-10-CM | POA: Diagnosis not present

## 2023-05-26 NOTE — Progress Notes (Signed)
.smr  Office: [phone]  /  Fax: [phone]  WEIGHT SUMMARY AND BIOMETRICS  Anthropometric Measurements Height: 5\' 3"  (1.6 m) Weight: 185 lb (83.9 kg) BMI (Calculated): 32.78 Weight at Last Visit: N/A Weight Lost Since Last Visit: N/A Weight Gained Since Last Visit: N/A Starting Weight: N/A   Body Composition  Body Fat %: 39.4 % Fat Mass (lbs): 73.2 lbs Muscle Mass (lbs): 106.8 lbs Total Body Water (lbs): 76 lbs Visceral Fat Rating : 7   Other Clinical Data Fasting: No Labs: No Today's Visit #: Info Session Comments: Information Session    Chief Complaint: OBESITY   History of Present Illness   The patient, with polycystic ovary syndrome, presents with obesity seeking treatment options.  She presents with obesity, currently weighing 185 pounds with a BMI of 32.9, a visceral fat rating of 7, and a body fat percentage of 39.4. She is 5 foot 3 inches tall. She has a history of polycystic ovary syndrome (PCOS), which contributes to her weight management challenges. After her first child, she managed her weight with diet, exercise, and Wegovy, which was effective. However, after her second child, she encountered insurance issues with Medicaid not covering Wegovy as it was out of network. She is seeking a provider in-network to manage her weight loss.  She was previously on [facility] for about a year, reaching a dose of 2.4 mg, which she found beneficial. She experienced some initial nausea, which resolved over time. Currently, she is not on any medication and is looking to resume [place]. No gastrointestinal issues were noted while on Wegovy, except for the initial nausea.  She recently had her second child, a month ago, who weighed 10 pounds at birth. There was no diagnosis of gestational diabetes, although she had to take a second glucose test during pregnancy, which she passed. Her first child is 23 months old, and she conceived naturally with the second child after IVF with  the first. She has no current plans for future pregnancies, as her husband is planning a vasectomy.  She is a Engineer, civil (consulting).          PHYSICAL EXAM:  Blood pressure 112/68, pulse 80, temperature 98.1 F (36.7 C), height 5\' 3"  (1.6 m), weight 185 lb (83.9 kg), SpO2 99%, unknown if currently breastfeeding. Body mass index is 32.77 kg/m.  DIAGNOSTIC DATA REVIEWED:  BMET    Component Value Date/Time   NA 135 04/17/2023 1605   NA 141 07/19/2019 1448   K 3.9 04/17/2023 1605   CL 107 04/17/2023 1605   CO2 22 04/17/2023 1605   GLUCOSE 94 04/17/2023 1605   BUN 9 04/17/2023 1605   BUN 10 07/19/2019 1448   CREATININE 0.52 04/17/2023 1605   CREATININE 0.75 02/03/2017 1155   CALCIUM 9.4 04/17/2023 1605   GFRNONAA >60 04/17/2023 1605   GFRNONAA 112 02/03/2017 1155   GFRAA 116 07/19/2019 1448   GFRAA 129 02/03/2017 1155   No results found for: "HGBA1C" No results found for: "INSULIN" Lab Results  Component Value Date   TSH 3.340 04/08/2018   CBC    Component Value Date/Time   WBC 13.5 (H) 04/25/2023 0428   RBC 3.86 (L) 04/25/2023 0428   HGB 10.6 (L) 04/25/2023 0428   HGB 13.7 04/08/2018 0857   HCT 32.0 (L) 04/25/2023 0428   HCT 41.9 04/08/2018 0857   PLT 342 04/25/2023 0428   MCV 82.9 04/25/2023 0428   MCV 87 04/08/2018 0857   MCH 27.5 04/25/2023 0428   MCHC 33.1  04/25/2023 0428   RDW 12.9 04/25/2023 0428   RDW 12.3 04/08/2018 0857   Iron Studies No results found for: "IRON", "TIBC", "FERRITIN", "IRONPCTSAT" Lipid Panel     Component Value Date/Time   CHOL 127 04/08/2018 0857   TRIG 88 04/08/2018 0857   HDL 53 04/08/2018 0857   CHOLHDL 2.4 04/08/2018 0857   LDLCALC 56 04/08/2018 0857   Hepatic Function Panel     Component Value Date/Time   PROT 6.5 04/17/2023 1605   PROT 7.8 07/19/2019 1448   ALBUMIN 2.7 (L) 04/17/2023 1605   ALBUMIN 4.9 07/19/2019 1448   AST 23 04/17/2023 1605   ALT 26 04/17/2023 1605   ALKPHOS 106 04/17/2023 1605   BILITOT 0.4 04/17/2023 1605    BILITOT 0.3 07/19/2019 1448      Component Value Date/Time   TSH 3.340 04/08/2018 0857   Nutritional No results found for: "VD25OH"   Assessment and Plan    Obesity Obesity with a BMI of 32.9. PCOS contributing to weight management challenges. Previously effective management with Reginal Lutes, currently not on medication due to insurance issues. Discussed genetic and physiological mechanisms contributing to obesity, emphasizing the need to avoid triggering defense mechanisms by not cutting calories too low and ensuring adequate nutrition. Explained that GLP-1 drugs like Wegovy can decrease appetite but must be used carefully to avoid sarcopenia. Discussed the importance of personalized testing to determine the correct caloric intake and nutritional needs. Insurance covers comorbidities like PCOS, and the program fee covers nutritional counseling and other non-covered services. - Schedule workup visit for comprehensive testing including resting metabolic rate, thyroid function, degrees of insulin resistance, vitamin and mineral deficiencies, anemia, and kidney/liver function. - Initiate an eating plan based on test results. - Follow-up visit in a couple of weeks to review test results and discuss potential medication options. - Provide new patient paperwork for detailed health and eating history, to be completed before the next visit. - Ensure fasting for 8 hours before the workup visit, with no caffeine, nicotine, or exercise.  Polycystic Ovary Syndrome (PCOS) PCOS diagnosed after the first pregnancy, contributing to difficulty in weight loss. Insulin resistance associated with PCOS. - Include PCOS as a comorbidity in insurance documentation to ensure coverage for obesity treatment.  General Health Maintenance Discussed the importance of regular follow-ups and comprehensive testing to tailor the weight management plan. - Schedule follow-up visits every two weeks for the first three months,  then extend to three or four weeks as progress is made. - Continue follow-ups every three to four months once the goal weight is achieved.         I have personally spent 30 minutes total time today in preparation, patient care, and documentation for this visit, including the following: review of clinical lab tests; review of medical tests/procedures/services.    She was informed of the importance of frequent follow up visits to maximize her success with intensive lifestyle modifications for her multiple health conditions.    Quillian Quince, MD

## 2023-05-28 ENCOUNTER — Encounter: Payer: Self-pay | Admitting: Podiatry

## 2023-05-28 ENCOUNTER — Ambulatory Visit (INDEPENDENT_AMBULATORY_CARE_PROVIDER_SITE_OTHER): Payer: Commercial Managed Care - PPO | Admitting: Podiatry

## 2023-05-28 VITALS — Ht 63.0 in | Wt 185.0 lb

## 2023-05-28 DIAGNOSIS — L600 Ingrowing nail: Secondary | ICD-10-CM | POA: Diagnosis not present

## 2023-05-28 NOTE — Progress Notes (Signed)
   Chief Complaint  Patient presents with   Ingrown Toenail    Pt is here due to ingrown on right great toenail, pt states it has been there for 2-3 weeks and hurts a lot.    Subjective: Patient presents today for evaluation of pain to the lateral border right great toe. Patient is concerned for possible ingrown nail.  It is very sensitive to touch.  Patient presents today for further treatment and evaluation.  Past Medical History:  Diagnosis Date   Allergic rhinitis    Chlamydia 2008   Medical history non-contributory    PCOS (polycystic ovarian syndrome)    Pregnancy induced hypertension     Past Surgical History:  Procedure Laterality Date   IR ANGIOGRAM SELECTIVE EACH ADDITIONAL VESSEL  06/20/2019   IR ANGIOGRAM SELECTIVE EACH ADDITIONAL VESSEL  06/20/2019   IR ANGIOGRAM SELECTIVE EACH ADDITIONAL VESSEL  06/20/2019   IR ANGIOGRAM SELECTIVE EACH ADDITIONAL VESSEL  06/20/2019   IR ANGIOGRAM SELECTIVE EACH ADDITIONAL VESSEL  06/20/2019   IR ANGIOGRAM SELECTIVE EACH ADDITIONAL VESSEL  06/20/2019   IR ANGIOGRAM SELECTIVE EACH ADDITIONAL VESSEL  06/20/2019   IR ANGIOGRAM VISCERAL SELECTIVE  06/20/2019   IR EMBO ART  VEN HEMORR LYMPH EXTRAV  INC GUIDE ROADMAPPING  06/20/2019   IR US  GUIDE VASC ACCESS RIGHT  06/20/2019   WISDOM TOOTH EXTRACTION  2013   no problems with anesthesia    Allergies  Allergen Reactions   Shellfish Allergy  Anaphylaxis   Shellfish Allergy  Swelling    Objective:  General: Well developed, nourished, in no acute distress, alert and oriented x3   Dermatology: Skin is warm, dry and supple bilateral.  Lateral border right great toe is tender with evidence of an ingrowing nail. Pain on palpation noted to the border of the nail fold. The remaining nails appear unremarkable at this time.   Vascular: DP and PT pulses palpable.  No clinical evidence of vascular compromise  Neruologic: Grossly intact via light touch bilateral.  Musculoskeletal: No pedal deformity  noted  Assesement: #1 Paronychia with ingrowing nail lateral border right great toe  Plan of Care:  -Patient evaluated.  -Discussed treatment alternatives and plan of care. Explained nail avulsion procedure and post procedure course to patient. -Patient opted for permanent partial nail avulsion of the ingrown portion of the nail.  -Prior to procedure, local anesthesia infiltration utilized using 3 ml of a 50:50 mixture of 2% plain lidocaine  and 0.5% plain marcaine  in a normal hallux block fashion and a betadine prep performed.  -Partial permanent nail avulsion with chemical matrixectomy performed using 3x30sec applications of phenol followed by alcohol flush.  -Light dressing applied.  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 Thresa EMERSON Sar, DPM    [address] [place], [place] [address]                Office [phone]  Fax [phone]

## 2023-06-02 ENCOUNTER — Other Ambulatory Visit (HOSPITAL_COMMUNITY): Payer: Self-pay

## 2023-06-02 ENCOUNTER — Telehealth: Payer: Commercial Managed Care - PPO

## 2023-06-02 DIAGNOSIS — A084 Viral intestinal infection, unspecified: Secondary | ICD-10-CM | POA: Diagnosis not present

## 2023-06-02 MED ORDER — ONDANSETRON 4 MG PO TBDP
4.0000 mg | ORAL_TABLET | Freq: Three times a day (TID) | ORAL | 0 refills | Status: DC | PRN
  Filled 2023-06-02: qty 20, 7d supply, fill #0

## 2023-06-02 NOTE — Progress Notes (Signed)

## 2023-06-02 NOTE — Progress Notes (Signed)
 I have spent 5 minutes in review of e-visit questionnaire, review and updating patient chart, medical decision making and response to patient.   Piedad Climes, PA-C

## 2023-06-03 ENCOUNTER — Other Ambulatory Visit (HOSPITAL_COMMUNITY): Payer: Self-pay

## 2023-06-03 DIAGNOSIS — Z1389 Encounter for screening for other disorder: Secondary | ICD-10-CM | POA: Diagnosis not present

## 2023-06-03 DIAGNOSIS — N816 Rectocele: Secondary | ICD-10-CM | POA: Diagnosis not present

## 2023-06-03 DIAGNOSIS — Z309 Encounter for contraceptive management, unspecified: Secondary | ICD-10-CM | POA: Diagnosis not present

## 2023-06-03 DIAGNOSIS — H6063 Unspecified chronic otitis externa, bilateral: Secondary | ICD-10-CM | POA: Diagnosis not present

## 2023-06-03 MED ORDER — MOMETASONE FUROATE 0.1 % EX SOLN
3.0000 [drp] | CUTANEOUS | 11 refills | Status: AC
  Filled 2023-06-03: qty 60, 30d supply, fill #0
  Filled 2023-06-28: qty 60, 30d supply, fill #1
  Filled 2023-06-30: qty 30, 15d supply, fill #1
  Filled 2023-07-13: qty 30, 30d supply, fill #1
  Filled 2023-08-11: qty 30, 30d supply, fill #2
  Filled 2023-08-19: qty 30, 30d supply, fill #0
  Filled 2023-09-16: qty 30, 30d supply, fill #1
  Filled 2023-10-16: qty 30, 30d supply, fill #2
  Filled 2023-11-21: qty 30, 30d supply, fill #3
  Filled 2023-11-24: qty 30, 30d supply, fill #0
  Filled 2023-12-21: qty 30, 30d supply, fill #1
  Filled 2024-01-20: qty 60, 60d supply, fill #2
  Filled 2024-01-20: qty 30, 30d supply, fill #2
  Filled 2024-02-12 – 2024-03-19 (×2): qty 60, 60d supply, fill #3

## 2023-06-04 ENCOUNTER — Other Ambulatory Visit (HOSPITAL_COMMUNITY): Payer: Self-pay

## 2023-06-05 ENCOUNTER — Other Ambulatory Visit (HOSPITAL_COMMUNITY): Payer: Self-pay

## 2023-06-17 ENCOUNTER — Other Ambulatory Visit: Payer: Self-pay

## 2023-06-17 ENCOUNTER — Encounter: Payer: Self-pay | Admitting: Family Medicine

## 2023-06-17 ENCOUNTER — Ambulatory Visit (INDEPENDENT_AMBULATORY_CARE_PROVIDER_SITE_OTHER): Payer: Medicaid Other | Admitting: Family Medicine

## 2023-06-17 VITALS — BP 110/79 | HR 89 | Ht 63.0 in | Wt 187.3 lb

## 2023-06-17 DIAGNOSIS — E66811 Obesity, class 1: Secondary | ICD-10-CM | POA: Diagnosis not present

## 2023-06-17 DIAGNOSIS — E6609 Other obesity due to excess calories: Secondary | ICD-10-CM | POA: Diagnosis not present

## 2023-06-17 DIAGNOSIS — D229 Melanocytic nevi, unspecified: Secondary | ICD-10-CM

## 2023-06-17 DIAGNOSIS — Z8742 Personal history of other diseases of the female genital tract: Secondary | ICD-10-CM

## 2023-06-17 DIAGNOSIS — F53 Postpartum depression: Secondary | ICD-10-CM | POA: Insufficient documentation

## 2023-06-17 DIAGNOSIS — N816 Rectocele: Secondary | ICD-10-CM

## 2023-06-17 DIAGNOSIS — Z91013 Allergy to seafood: Secondary | ICD-10-CM | POA: Diagnosis not present

## 2023-06-17 DIAGNOSIS — Z6833 Body mass index (BMI) 33.0-33.9, adult: Secondary | ICD-10-CM | POA: Diagnosis not present

## 2023-06-17 DIAGNOSIS — Z130 Encounter for screening for diseases of the blood and blood-forming organs and certain disorders involving the immune mechanism: Secondary | ICD-10-CM

## 2023-06-17 DIAGNOSIS — E282 Polycystic ovarian syndrome: Secondary | ICD-10-CM

## 2023-06-17 DIAGNOSIS — O139 Gestational [pregnancy-induced] hypertension without significant proteinuria, unspecified trimester: Secondary | ICD-10-CM

## 2023-06-17 DIAGNOSIS — Z7689 Persons encountering health services in other specified circumstances: Secondary | ICD-10-CM

## 2023-06-17 MED ORDER — SERTRALINE HCL 50 MG PO TABS
50.0000 mg | ORAL_TABLET | Freq: Every day | ORAL | 3 refills | Status: DC
  Filled 2023-06-17 (×2): qty 30, 30d supply, fill #0
  Filled 2023-06-28 – 2023-07-14 (×3): qty 30, 30d supply, fill #1
  Filled 2023-08-11: qty 30, 30d supply, fill #2
  Filled 2023-09-10: qty 30, 30d supply, fill #3

## 2023-06-17 NOTE — Assessment & Plan Note (Signed)
 IVF treatments for first child Second pregnancy was unplanned Hx of pcos - would like to be seen by endocrine for further eval on hormonal needs

## 2023-06-17 NOTE — Assessment & Plan Note (Signed)
 Being seen by weight mgmt clinic Historically was on wegovy for some time She is hoping to restart medication through seeing her weight mgmt specialist Continue to make conscious decisions for well balanced diet smaller portions with increase protein, fruits, veggies, water as drink of choice, decrease starches, processed foods, and saturated fats. Increase weekly exercise - 150 minutes per week.

## 2023-06-17 NOTE — Assessment & Plan Note (Signed)
 Since resolved BP stable and controlled today.

## 2023-06-17 NOTE — Assessment & Plan Note (Signed)
 Hx of PCOS and IVF treatments Pt would like to be seen by endocrine for further evaluation given her history

## 2023-06-17 NOTE — Progress Notes (Signed)
 New Patient Office Visit  Introduced to nurse practitioner role and practice setting.  All questions answered.  Discussed provider/patient relationship and expectations.  Subjective    Patient ID: Samantha Bailey, female    DOB: 1990-09-02  Age: 33 y.o. MRN: [id number]  CC:  Chief Complaint  Patient presents with   New Patient (Initial Visit)    Patient here to establish care and discuss updating zoloft dose.    Anxiety    Samantha Bailey is a 33 y.o. female who presents for follow up of anxiety disorder. Current symptoms: none. She denies current suicidal and homicidal ideation. She complains of the following side effects from the treatment: none   Med Change Request    Pt reports she has been on zoloft last week of December. She states she is tolerating the medication with no side effects but feel like a increase would assist her.    HPI Samantha Bailey presents to establish care, 33 year old female who presents to establish care and manage postpartum depression.  She is currently managing postpartum depression with Zoloft 25 mg, which has significantly improved her symptoms. She wants to increase the dose to 50 mg for better symptom control. She is on maternity leave and plans to return to work in the first week of April.  She experienced a stage one rectocele prolapse following the birth of her second child in January, who weighed ten pounds and one ounce. She has scheduled a visit with a urologist for a second opinion and is awaiting a call back from pelvic floor therapy for further management. She describes difficulty with bowel movements since the prolapse.  She has a history of chronic ear infections and is under the care of [facility]. She uses steroid ear drops twice a week, which have helped alleviate itching and subsequent infections.  She has a history of PCOS and is interested in seeing an endocrinologist for further management and potential hormone  evaluation. She previously took [place] before her pregnancy and is trying to get it prescribed again through a weight loss clinic.  She has allergic rhinitis and is interested in seeing an allergist for repeat testing, especially given her shellfish allergy.  She is interested in seeing a dermatologist for a routine skin check and plans to utilize her current insurance coverage for preventive care.   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 No current alcohol, tobacco, or substance use.  Outpatient Encounter Medications as of 06/17/2023  Medication Sig   acetic acid-hydrocortisone (VOSOL-HC) OTIC solution Place 5 drops into the affected ear(s) 2 (two) times daily for 14 days.   mometasone (ELOCON) 0.1 % lotion Apply 3-5 drops in the ears 2 (two) times a week at bedtime.   ondansetron (ZOFRAN-ODT) 4 MG disintegrating tablet Take 1 tablet (4 mg total) by mouth every 8 (eight) hours as needed for nausea or vomiting.   Semaglutide-Weight Management (WEGOVY) 0.25 MG/0.5ML SOAJ Inject 0.25 mg into the skin once a week.   sertraline (ZOLOFT) 50 MG tablet Take 1 tablet (50 mg total) by mouth daily.   [DISCONTINUED] sertraline (ZOLOFT) 25 MG tablet Take 1 tablet (25 mg total) by mouth daily.   Semaglutide-Weight Management (WEGOVY) 1 MG/0.5ML SOAJ    [DISCONTINUED] doxylamine, Sleep, (UNISOM SLEEPTABS) 25 MG tablet Take 1 tablet every day by oral route for 28 days.   No facility-administered encounter medications on file as of 06/17/2023.  Past Medical History:  Diagnosis Date   Allergic rhinitis    Chlamydia 2008   Medical history non-contributory    PCOS (polycystic ovarian syndrome)    Pregnancy induced hypertension     Past Surgical History:  Procedure Laterality Date   IR ANGIOGRAM SELECTIVE EACH ADDITIONAL VESSEL  06/20/2019   IR ANGIOGRAM SELECTIVE EACH ADDITIONAL VESSEL  06/20/2019   IR ANGIOGRAM SELECTIVE EACH ADDITIONAL VESSEL  06/20/2019    IR ANGIOGRAM SELECTIVE EACH ADDITIONAL VESSEL  06/20/2019   IR ANGIOGRAM SELECTIVE EACH ADDITIONAL VESSEL  06/20/2019   IR ANGIOGRAM SELECTIVE EACH ADDITIONAL VESSEL  06/20/2019   IR ANGIOGRAM SELECTIVE EACH ADDITIONAL VESSEL  06/20/2019   IR ANGIOGRAM VISCERAL SELECTIVE  06/20/2019   IR EMBO ART  VEN HEMORR LYMPH EXTRAV  INC GUIDE ROADMAPPING  06/20/2019   IR US GUIDE VASC ACCESS RIGHT  06/20/2019   WISDOM TOOTH EXTRACTION  2013   no problems with anesthesia    Family History  Problem Relation Age of Onset   Healthy Mother    Varicose Veins Mother    Hypertension Father    Healthy Maternal Grandmother    Healthy Maternal Grandfather    Healthy Paternal Grandmother    Healthy Paternal Grandfather    Breast cancer Neg Hx    Cancer - Colon Neg Hx    Cervical cancer Neg Hx    Colon cancer Neg Hx    Ovarian cancer Neg Hx     Social History   Socioeconomic History   Marital status: Married    Spouse name: Jomarie Longs   Number of children: 0   Years of education: college   Highest education level: Associate degree: occupational, Scientist, product/process development, or vocational program  Occupational History    Employer: Bartender    Comment: [employer]   Occupation: student    Comment: Nursing School at [employer]  Tobacco Use   Smoking status: Never   Smokeless tobacco: Never  Vaping Use   Vaping status: Never Used  Substance and Sexual Activity   Alcohol use: No   Drug use: Never   Sexual activity: Yes    Partners: Male    Birth control/protection: Condom, None  Other Topics Concern   Not on file  Social History Narrative   ** Merged History Encounter **       Social Drivers of Health   Financial Resource Strain: Low Risk  (06/15/2023)   Overall Financial Resource Strain (CARDIA)    Difficulty of Paying Living Expenses: Not hard at all  Food Insecurity: No Food Insecurity (06/15/2023)   Hunger Vital Sign    Worried About Running Out of Food in the Last Year: Never true    Ran Out  of Food in the Last Year: Never true  Transportation Needs: No Transportation Needs (06/15/2023)   PRAPARE - Administrator, Civil Service (Medical): No    Lack of Transportation (Non-Medical): No  Physical Activity: Sufficiently Active (06/15/2023)   Exercise Vital Sign    Days of Exercise per Week: 5 days    Minutes of Exercise per Session: 50 min  Stress: No Stress Concern Present (06/15/2023)   [employer] of Occupational Health - Occupational Stress Questionnaire    Feeling of Stress : Not at all  Social Connections: Moderately Isolated (06/15/2023)   Social Connection and Isolation Panel [NHANES]    Frequency of Communication with Friends and Family: Twice a week    Frequency of Social Gatherings with Friends and Family: More  than three times a week    Attends Religious Services: Never    Active Member of Clubs or Organizations: No    Attends Banker Meetings: Not on file    Marital Status: Married  Catering manager Violence: Not At Risk (04/24/2023)   Humiliation, Afraid, Rape, and Kick questionnaire    Fear of Current or Ex-Partner: No    Emotionally Abused: No    Physically Abused: No    Sexually Abused: No   Flowsheet Row Office Visit from 11/01/2020 in [facility]  PHQ-9 Total Score 0         06/17/2023    9:25 AM 02/03/2017   11:58 AM  GAD 7 : Generalized Anxiety Score  Nervous, Anxious, on Edge 0 3  Control/stop worrying 0 3  Worry too much - different things 0 3  Trouble relaxing 0 3  Restless 0 1  Easily annoyed or irritable 1 3  Afraid - awful might happen 0 3  Total GAD 7 Score 1 19  Anxiety Difficulty Not difficult at all Extremely difficult     Review of Systems  All other systems reviewed and are negative.       Objective    BP 110/79 (BP Location: Left Arm, Patient Position: Sitting, Cuff Size: Normal)   Pulse 89   Ht 5\' 3"  (1.6 m)   Wt 187 lb 4.8 oz (85 kg)   Breastfeeding No   BMI  33.18 kg/m   Physical Exam Constitutional:      General: She is not in acute distress.    Appearance: Normal appearance. She is normal weight. She is not ill-appearing.  HENT:     Head: Normocephalic.     Right Ear: Ear canal and external ear normal. A middle ear effusion is present.     Left Ear: Ear canal and external ear normal. A middle ear effusion is present.     Ears:     Comments: Mild effusion bilaterally    Nose: Nose normal.     Mouth/Throat:     Mouth: Mucous membranes are moist.     Pharynx: Oropharynx is clear. No oropharyngeal exudate or posterior oropharyngeal erythema.  Eyes:     General: Lids are normal.     Extraocular Movements: Extraocular movements intact.     Right eye: Normal extraocular motion.     Left eye: Normal extraocular motion.     Conjunctiva/sclera: Conjunctivae normal.     Right eye: Right conjunctiva is not injected.     Left eye: Left conjunctiva is not injected.     Pupils: Pupils are equal, round, and reactive to light.  Neck:     Thyroid: No thyroid mass, thyromegaly or thyroid tenderness.     Vascular: No carotid bruit.  Cardiovascular:     Rate and Rhythm: Normal rate.     Pulses: Normal pulses.          Radial pulses are 2+ on the right side and 2+ on the left side.       Posterior tibial pulses are 2+ on the right side and 2+ on the left side.     Heart sounds: Normal heart sounds, S1 normal and S2 normal. No murmur heard.    No friction rub. No gallop.  Pulmonary:     Effort: Pulmonary effort is normal. No respiratory distress.     Breath sounds: Normal breath sounds. No stridor. No wheezing, rhonchi or rales.  Musculoskeletal:  General: No swelling or tenderness. Normal range of motion.     Cervical back: Normal range of motion. No rigidity.  Lymphadenopathy:     Cervical: No cervical adenopathy.     Right cervical: No superficial, deep or posterior cervical adenopathy.    Left cervical: No superficial, deep or posterior  cervical adenopathy.  Skin:    General: Skin is warm and dry.     Capillary Refill: Capillary refill takes less than 2 seconds.     Findings: No bruising or erythema.  Neurological:     General: No focal deficit present.     Mental Status: She is alert and oriented to person, place, and time. Mental status is at baseline.     GCS: GCS eye subscore is 4. GCS verbal subscore is 5. GCS motor subscore is 6.     Cranial Nerves: No cranial nerve deficit.     Sensory: No sensory deficit.     Motor: No weakness, tremor or pronator drift.     Coordination: Romberg sign negative.     Gait: Gait is intact. Gait normal.  Psychiatric:        Attention and Perception: Attention and perception normal.        Mood and Affect: Mood and affect normal.        Speech: Speech normal.        Behavior: Behavior normal. Behavior is cooperative.        Thought Content: Thought content normal.        Cognition and Memory: Cognition and memory normal.        Judgment: Judgment normal.         Assessment & Plan:   Post partum depression Assessment & Plan: Currently on Zoloft 25mg  daily, reports significant improvement in mood. Requested increase in dose due to some continued irritability. PHQ9=0; GAD7 = 1 -Increase Zoloft to 50mg  daily. -Schedule follow-up in 4-6 weeks to assess med response.  Orders: -     Sertraline HCl; Take 1 tablet (50 mg total) by mouth daily.  Dispense: 30 tablet; Refill: 3  Class 1 obesity due to excess calories without serious comorbidity with body mass index (BMI) of 33.0 to 33.9 in adult Assessment & Plan: Being seen by weight mgmt clinic Historically was on wegovy for some time She is hoping to restart medication through seeing her weight mgmt specialist Continue to make conscious decisions for well balanced diet smaller portions with increase protein, fruits, veggies, water as drink of choice, decrease starches, processed foods, and saturated fats. Increase weekly  exercise - 150 minutes per week.    Orders: -     Lipid panel -     Comprehensive metabolic panel -     Hemoglobin A1c  Shellfish allergy Assessment & Plan: Chronic Combined with multiple environmental allergies Would like further testing through Allergist Will refer  Orders: -     Ambulatory referral to Allergy  PCOS (polycystic ovarian syndrome) Assessment & Plan: Hx of PCOS and IVF treatments Pt would like to be seen by endocrine for further evaluation given her history  Orders: -     Ambulatory referral to Endocrinology  Gestational hypertension, antepartum Assessment & Plan: Since resolved BP stable and controlled today.   Numerous moles Assessment & Plan: Hx of numerous moles getting removed - all benign Pt would like to establish care with Derm to starting getting annual skin checks Continue skin hygiene - spf 50 Cover arms, head, and legs in prolonged periods of  sun exposure  Orders: -     Ambulatory referral to Dermatology  Rectocele Assessment & Plan: Rectocele post partum OB/GYN assessed - states stage 1, but pt is concerned it is worse Already has appt with urology on 07/14/23 for further eval Has referral in for pelvic floor PT Recommend continuing stool softeners daily to reduce strain    History of infertility Assessment & Plan: IVF treatments for first child Second pregnancy was unplanned Hx of pcos - would like to be seen by endocrine for further eval on hormonal needs  Orders: -     Ambulatory referral to Endocrinology  Screening for deficiency anemia -     CBC  Encounter to establish care with new doctor Assessment & Plan: Welcome to [facility] Routine Labs Referrals: Allergy, Derm, and Endocrine  Increased Zoloft to 50mg  daily Fu 4-6 weeks for mood check    Return in about 4 weeks (around 07/15/2023) for virtual video for Mood check - 4 to 6 weeks check.   I, Sallee Provencal, FNP, have reviewed all documentation for this visit.  The documentation on 06/17/23 for the exam, diagnosis, procedures, and orders are all accurate and complete.   Sallee Provencal, FNP

## 2023-06-17 NOTE — Assessment & Plan Note (Signed)
 Currently on Zoloft 25mg  daily, reports significant improvement in mood. Requested increase in dose due to some continued irritability. PHQ9=0; GAD7 = 1 -Increase Zoloft to 50mg  daily. -Schedule follow-up in 4-6 weeks to assess med response.

## 2023-06-17 NOTE — Assessment & Plan Note (Signed)
 Chronic Combined with multiple environmental allergies Would like further testing through Allergist Will refer

## 2023-06-17 NOTE — Assessment & Plan Note (Signed)
 Hx of numerous moles getting removed - all benign Pt would like to establish care with Derm to starting getting annual skin checks Continue skin hygiene - spf 50 Cover arms, head, and legs in prolonged periods of sun exposure

## 2023-06-17 NOTE — Assessment & Plan Note (Addendum)
 Rectocele post partum OB/GYN assessed - states stage 1, but pt is concerned it is worse Already has appt with urology on 07/14/23 for further eval Has referral in for pelvic floor PT Recommend continuing stool softeners daily to reduce strain

## 2023-06-17 NOTE — Assessment & Plan Note (Signed)
 Welcome to [facility] Routine Labs Referrals: Allergy, Derm, and Endocrine  Increased Zoloft to 50mg  daily Fu 4-6 weeks for mood check

## 2023-06-18 ENCOUNTER — Encounter: Payer: Self-pay | Admitting: Family Medicine

## 2023-06-18 ENCOUNTER — Other Ambulatory Visit (HOSPITAL_COMMUNITY): Payer: Self-pay

## 2023-06-18 LAB — COMPREHENSIVE METABOLIC PANEL
ALT: 37 IU/L — ABNORMAL HIGH (ref 0–32)
AST: 27 IU/L (ref 0–40)
Albumin: 4.6 g/dL (ref 4.0–5.0)
Alkaline Phosphatase: 83 IU/L (ref 44–121)
BUN/Creatinine Ratio: 12 (ref 9–23)
BUN: 9 mg/dL (ref 6–20)
Bilirubin Total: 0.3 mg/dL (ref 0.0–1.2)
CO2: 22 mmol/L (ref 20–29)
Calcium: 9.8 mg/dL (ref 8.7–10.2)
Chloride: 102 mmol/L (ref 96–106)
Creatinine, Ser: 0.78 mg/dL (ref 0.57–1.00)
Globulin, Total: 3.2 g/dL (ref 1.5–4.5)
Glucose: 66 mg/dL — ABNORMAL LOW (ref 70–99)
Potassium: 4.4 mmol/L (ref 3.5–5.2)
Sodium: 141 mmol/L (ref 134–144)
Total Protein: 7.8 g/dL (ref 6.0–8.5)
eGFR: 105 mL/min/{1.73_m2} (ref >59)

## 2023-06-18 LAB — HEMOGLOBIN A1C
Est. average glucose Bld gHb Est-mCnc: 108 mg/dL
Hgb A1c MFr Bld: 5.4 % (ref 4.8–5.6)

## 2023-06-18 LAB — CBC
Hematocrit: 43.1 % (ref 34.0–46.6)
Hemoglobin: 13.6 g/dL (ref 11.1–15.9)
MCH: 26.3 pg — ABNORMAL LOW (ref 26.6–33.0)
MCHC: 31.6 g/dL (ref 31.5–35.7)
MCV: 83 fL (ref 79–97)
Platelets: 360 10*3/uL (ref 150–450)
RBC: 5.17 x10E6/uL (ref 3.77–5.28)
RDW: 13.6 % (ref 11.7–15.4)
WBC: 3.7 10*3/uL (ref 3.4–10.8)

## 2023-06-18 LAB — LIPID PANEL
Chol/HDL Ratio: 3 ratio (ref 0.0–4.4)
Cholesterol, Total: 159 mg/dL (ref 100–199)
HDL: 53 mg/dL
LDL Chol Calc (NIH): 86 mg/dL (ref 0–99)
Triglycerides: 108 mg/dL (ref 0–149)
VLDL Cholesterol Cal: 20 mg/dL (ref 5–40)

## 2023-06-19 ENCOUNTER — Encounter (HOSPITAL_COMMUNITY): Payer: Self-pay

## 2023-06-19 ENCOUNTER — Encounter: Payer: Self-pay | Admitting: Family Medicine

## 2023-06-19 ENCOUNTER — Other Ambulatory Visit (HOSPITAL_COMMUNITY): Payer: Self-pay

## 2023-06-20 ENCOUNTER — Other Ambulatory Visit: Payer: Self-pay | Admitting: Family Medicine

## 2023-06-20 DIAGNOSIS — N816 Rectocele: Secondary | ICD-10-CM

## 2023-06-23 ENCOUNTER — Ambulatory Visit: Payer: Commercial Managed Care - PPO | Admitting: Podiatry

## 2023-06-24 ENCOUNTER — Ambulatory Visit (INDEPENDENT_AMBULATORY_CARE_PROVIDER_SITE_OTHER): Payer: Commercial Managed Care - PPO | Admitting: Family Medicine

## 2023-06-24 ENCOUNTER — Encounter (INDEPENDENT_AMBULATORY_CARE_PROVIDER_SITE_OTHER): Payer: Self-pay | Admitting: Family Medicine

## 2023-06-24 VITALS — BP 107/72 | HR 82 | Temp 98.1°F | Ht 63.0 in | Wt 182.0 lb

## 2023-06-24 DIAGNOSIS — Z1331 Encounter for screening for depression: Secondary | ICD-10-CM

## 2023-06-24 DIAGNOSIS — Z6832 Body mass index (BMI) 32.0-32.9, adult: Secondary | ICD-10-CM

## 2023-06-24 DIAGNOSIS — E282 Polycystic ovarian syndrome: Secondary | ICD-10-CM

## 2023-06-24 DIAGNOSIS — R5383 Other fatigue: Secondary | ICD-10-CM | POA: Diagnosis not present

## 2023-06-24 DIAGNOSIS — E6609 Other obesity due to excess calories: Secondary | ICD-10-CM

## 2023-06-24 DIAGNOSIS — E66811 Obesity, class 1: Secondary | ICD-10-CM | POA: Diagnosis not present

## 2023-06-24 DIAGNOSIS — N816 Rectocele: Secondary | ICD-10-CM | POA: Diagnosis not present

## 2023-06-24 DIAGNOSIS — R0602 Shortness of breath: Secondary | ICD-10-CM

## 2023-06-24 NOTE — Assessment & Plan Note (Signed)
 Patient is awaiting pelvic floor PT scheduling and appointment with urogynecology.  States she is a Stage 1.

## 2023-06-24 NOTE — Assessment & Plan Note (Signed)
 Historical diagnosis.  She needed to undergo IVF for her first child.  She is not on any medications, was on metformin previously.  She did well on metformin.

## 2023-06-24 NOTE — Assessment & Plan Note (Addendum)
 Starting weight: 182 Peak weight: 187 BMR: 1555 Previous obesity management: Wegovy 2.4mg  lost 15lbs, low carb lost 15lbs but regained Body Fat %: 39.1% Starting Meal Plan: Cat 2 +100  Meal Plan needs: no fish or pork Likes/ Dislikes of meal plan:

## 2023-06-24 NOTE — Progress Notes (Signed)
 Chief Complaint:  Obesity   Subjective:  Samantha Bailey (MR# [id number]) is a 33 y.o. female who presents for evaluation and treatment of obesity and related comorbidities.   Samantha Bailey is currently in the action stage of change and ready to dedicate time achieving and maintaining a healthier weight. Samantha Bailey is interested in becoming our patient and working on intensive lifestyle modifications including (but not limited to) diet and exercise for weight loss.  Samantha Bailey has been struggling with her weight. She has been unsuccessful in either losing weight, maintaining weight loss, or reaching her healthy weight goal.  Lives at [facility] ith husband Para March, son Mosetta Putt (age 51) and son Cornelius Moras (2 months).  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 Family is very supportive of her, they eat meals together and her husband will be changing how he eats.  Desired weight is 160lbs and last time she was that weight was in 2018.  Steady weight gain since that time.  Lost 15lbs of her pregnancy weight with Wegovy after her first pregnancy.  She did get up to the max dose of 2.4mg .  After getting off Wegovy due to lack of insurance coverage she got pregnant again.   Food Recall: [employer] Medium iced coffee (cream and sugar) with bagel and butter- feels satisfied.  Around 2pm will have mini muffins or chicken caesar wrap made at home (half a breast, 1 tbsp, low carb mission tomato basil).  Feels satisfied.  Eats again around 6:30pm ham and cheese sliders (3 hawaiian rolls, 4 pieces ham 1 quarter slice cheese) or tacos. Feels full.    Indirect Calorimeter completed today shows a RMR: 1555. Her calculated basal metabolic rate is 3474 thus her basal metabolic rate is worse than expected.  Other Fatigue Samantha Bailey denies daytime somnolence and denies waking up still tired. Patient has a history of symptoms of morning headache sometimes. Samantha Bailey generally gets 7 or 8 hours of sleep per night, and states that she has generally restful  sleep. Snoring is not present. Apneic episodes are not present. Epworth Sleepiness Score is 1.   Shortness of Breath Samantha Bailey notes increasing shortness of breath with exercising and seems to be worsening over time with weight gain. She notes getting out of breath sooner with activity than she used to. This has not gotten worse recently. Samantha Bailey denies shortness of breath at rest or orthopnea.  Depression Screen Samantha Bailey's Food and Mood (modified PHQ-9) score was 5.     06/24/2023   11:55 AM  Depression screen PHQ 2/9  Decreased Interest 0  Down, Depressed, Hopeless 0  PHQ - 2 Score 0  Altered sleeping 0  Tired, decreased energy 0  Change in appetite 0  Feeling bad or failure about yourself  0  Trouble concentrating 0  Moving slowly or fidgety/restless 0  Suicidal thoughts 0  PHQ-9 Score 0  Difficult doing work/chores Not difficult at all     Objective:  Vitals Temp: 98.1 F (36.7 C) BP: 107/72 Pulse Rate: 82 SpO2: 97 %   Anthropometric Measurements Height: 5\' 3"  (1.6 m) Weight: 182 lb (82.6 kg) BMI (Calculated): 32.25 Starting Weight: 182 lb Waist Measurement : 37 inches   Body Composition  Body Fat %: 39.1 % Fat Mass (lbs): 71.4 lbs Muscle Mass (lbs): 105.8 lbs Total Body Water (lbs): 74.8 lbs Visceral Fat Rating : 7   Other Clinical Data RMR: 1555 Fasting: yes Labs: yes Today's Visit #: 1 Starting Date: 06/24/23    EKG:  Normal sinus rhythm, rate 71.  General: Cooperative, alert, well developed, in no acute distress. HEENT: Conjunctivae and lids unremarkable. Cardiovascular: Regular rhythm.  Lungs: Normal work of breathing. Neurologic: No focal deficits.   Lab Results  Component Value Date   CREATININE 0.78 06/17/2023   BUN 9 06/17/2023   NA 141 06/17/2023   K 4.4 06/17/2023   CL 102 06/17/2023   CO2 22 06/17/2023   Lab Results  Component Value Date   ALT 37 (H) 06/17/2023   AST 27 06/17/2023   ALKPHOS 83 06/17/2023   BILITOT 0.3 06/17/2023    Lab Results  Component Value Date   HGBA1C 5.4 06/17/2023   No results found for: "INSULIN" Lab Results  Component Value Date   TSH 3.340 04/08/2018   Lab Results  Component Value Date   CHOL 159 06/17/2023   HDL 53 06/17/2023   LDLCALC 86 06/17/2023   TRIG 108 06/17/2023   CHOLHDL 3.0 06/17/2023   Lab Results  Component Value Date   WBC 3.7 06/17/2023   HGB 13.6 06/17/2023   HCT 43.1 06/17/2023   MCV 83 06/17/2023   PLT 360 06/17/2023   No results found for: "IRON", "TIBC", "FERRITIN"  Assessment and Plan:   Other Fatigue  Samantha Bailey does feel that her weight is causing her energy to be lower than it should be. Fatigue may be related to obesity, depression or many other causes. Labs will be ordered, and in the meanwhile, Samantha Bailey will focus on self care including making healthy food choices, increasing physical activity and focusing on stress reduction.  Shortness of Breath  Samantha Bailey does feel that she gets out of breath more easily that she used to when she exercises. 's shortness of breath appears to be obesity related and exercise induced. She has agreed to work on weight loss and gradually increase exercise to treat her exercise induced shortness of breath. Will continue to monitor closely.   Problem List Items Addressed This Visit       Digestive   Rectocele   Patient is awaiting pelvic floor PT scheduling and appointment with urogynecology.  States she is a Stage 1.        Endocrine   PCOS (polycystic ovarian syndrome)   Historical diagnosis.  She needed to undergo IVF for her first child.  She is not on any medications, was on metformin previously.  She did well on metformin.      Relevant Orders   Insulin, random     Other   Class 1 obesity due to excess calories without serious comorbidity with body mass index (BMI) of 33.0 to 33.9 in adult   Starting weight: 182 Peak weight: 187 BMR: 1555 Previous obesity management: Wegovy 2.4mg  lost 15lbs, low carb  lost 15lbs but regained Body Fat %: 39.1% Starting Meal Plan: Cat 2 +100  Meal Plan needs: no fish or pork Likes/ Dislikes of meal plan:        Other Visit Diagnoses       Other fatigue    -  Primary   Relevant Orders   EKG 12-Lead (Completed)   Vitamin B12   T4, free   T3   Folate   VITAMIN D 25 Hydroxy (Vit-D Deficiency, Fractures)   TSH     SOBOE (shortness of breath on exertion)         Depression screening         Class 1 obesity with serious comorbidity and body mass index (BMI) of 32.0  to 32.9 in adult, unspecified obesity type           Samantha Bailey is currently in the action stage of change and her goal is to continue with weight loss efforts. I recommend Samantha Bailey begin the structured treatment plan as follows:  She has agreed to Category 2 Plan  Exercise goals: No exercise has been prescribed at this time.  Behavioral modification strategies:increasing lean protein intake, increasing vegetables, meal planning and cooking strategies, keeping healthy foods in the home, and better snacking choices  She was informed of the importance of frequent follow-up visits to maximize her success with intensive lifestyle modifications for her multiple health conditions. She was informed we would discuss her lab results at her next visit unless there is a critical issue that needs to be addressed sooner. Samantha Bailey agreed to keep her next visit at the agreed upon time to discuss these results.  Labs ordered with plans to discuss at the next visit.   Attestation Statements:  Reviewed by clinician on day of visit: allergies, medications, problem list, medical history, surgical history, family history, social history, and previous encounter notes. This is the patient's first visit at [facility]. The patient's NEW PATIENT PACKET was reviewed at length. Included in the packet: current and past health history, medications, allergies, ROS, gynecologic history (women only), surgical  history, family history, social history, weight history, weight loss surgery history (for those that have had weight loss surgery), nutritional evaluation, mood and food questionnaire, PHQ9, Epworth questionnaire, sleep habits questionnaire, patient life and health improvement goals questionnaire. These will all be scanned into the patient's chart under media.   During the visit, I independently reviewed the patient's EKG, bioimpedance scale results, and indirect calorimeter results. I used this information to tailor a meal plan for the patient that will help her to lose weight and will improve her obesity-related conditions going forward. I performed a medically necessary appropriate examination and/or evaluation. I discussed the assessment and treatment plan with the patient. The patient was provided an opportunity to ask questions and all were answered. The patient agreed with the plan and demonstrated an understanding of the instructions. Labs were ordered at this visit and will be reviewed at the next visit unless more critical results need to be addressed immediately. Clinical information was updated and documented in the EMR.     Time spent on visit including pre-visit chart review and post-visit charting and care was 45 minutes.   Reuben Likes, MD

## 2023-06-25 LAB — TSH: TSH: 1.39 u[IU]/mL (ref 0.450–4.500)

## 2023-06-25 LAB — VITAMIN B12: Vitamin B-12: 841 pg/mL (ref 232–1245)

## 2023-06-25 LAB — INSULIN, RANDOM: INSULIN: 6.6 u[IU]/mL (ref 2.6–24.9)

## 2023-06-25 LAB — VITAMIN D 25 HYDROXY (VIT D DEFICIENCY, FRACTURES): Vit D, 25-Hydroxy: 35.9 ng/mL (ref 30.0–100.0)

## 2023-06-25 LAB — T4, FREE: Free T4: 1.21 ng/dL (ref 0.82–1.77)

## 2023-06-25 LAB — T3: T3, Total: 123 ng/dL (ref 71–180)

## 2023-06-25 LAB — FOLATE: Folate: 5.4 ng/mL (ref >3.0)

## 2023-06-28 ENCOUNTER — Other Ambulatory Visit: Payer: Self-pay

## 2023-06-30 ENCOUNTER — Other Ambulatory Visit: Payer: Self-pay

## 2023-06-30 ENCOUNTER — Other Ambulatory Visit (HOSPITAL_COMMUNITY): Payer: Self-pay

## 2023-07-09 ENCOUNTER — Ambulatory Visit (INDEPENDENT_AMBULATORY_CARE_PROVIDER_SITE_OTHER): Payer: Commercial Managed Care - PPO | Admitting: Family Medicine

## 2023-07-10 ENCOUNTER — Other Ambulatory Visit (HOSPITAL_COMMUNITY): Payer: Self-pay

## 2023-07-10 ENCOUNTER — Encounter: Payer: Self-pay | Admitting: Family Medicine

## 2023-07-10 ENCOUNTER — Telehealth (INDEPENDENT_AMBULATORY_CARE_PROVIDER_SITE_OTHER): Payer: Commercial Managed Care - PPO | Admitting: Family Medicine

## 2023-07-10 DIAGNOSIS — F53 Postpartum depression: Secondary | ICD-10-CM | POA: Diagnosis not present

## 2023-07-10 NOTE — Assessment & Plan Note (Signed)
 Zoloft 50 mg increased previously. No significant symptom changes. Tolerating well.  Happy with current dosing, no alternative medications desired. - Continue Zoloft 50 mg daily. - Reassess if symptoms worsen. - Denies need for CBT

## 2023-07-10 NOTE — Progress Notes (Signed)
 Virtual Visit via Video Note  I connected with Samantha Bailey on 07/10/23 at 10:00 AM EDT by a video enabled telemedicine application and verified that I am speaking with the correct person using two identifiers.  Patient Location: Home Provider Location: Office/Clinic  I discussed the limitations, risks, security, and privacy concerns of performing an evaluation and management service by video and the availability of in person appointments. I also discussed with the patient that there may be a patient responsible charge related to this service. The patient expressed understanding and agreed to proceed.  Subjective: PCP: Samantha Provencal, FNP  No chief complaint on file.  Shelle Bailey is a 33 year old female who presents for a follow-up regarding Zoloft use.  She is currently taking Zoloft, which was increased to 50 mg at her last visit. She has not noticed any significant changes since starting the medication and feels that it is 'going fine.' She is satisfied with continuing the current dose and mentions an increase in appetite, which she attributes to the medication.  She has successfully scheduled appointments with a urogynecologist, allergist, and endocrinologist, although the urogynecologist appointment is likely scheduled for June due to availability. She is on a cancellation list for an earlier appointment if possible.   ROS: Per HPI  Current Outpatient Medications:    mometasone (ELOCON) 0.1 % lotion, Apply 3-5 drops in the ears 2 (two) times a week at bedtime., Disp: 30 mL, Rfl: 11   sertraline (ZOLOFT) 50 MG tablet, Take 1 tablet (50 mg total) by mouth daily., Disp: 30 tablet, Rfl: 3  Observations/Objective: There were no vitals filed for this visit. Physical Exam Vitals reviewed.  Constitutional:      General: She is not in acute distress.    Appearance: Normal appearance. She is not ill-appearing, toxic-appearing or diaphoretic.  Eyes:      Extraocular Movements: Extraocular movements intact.  Pulmonary:     Effort: Pulmonary effort is normal.     Breath sounds: Normal breath sounds.  Skin:    General: Skin is warm and dry.     Capillary Refill: Capillary refill takes less than 2 seconds.  Neurological:     General: No focal deficit present.     Mental Status: She is alert and oriented to person, place, and time.  Psychiatric:        Attention and Perception: Attention and perception normal.        Mood and Affect: Mood and affect normal.        Speech: Speech normal.        Behavior: Behavior normal. Behavior is cooperative.        Thought Content: Thought content normal.        Cognition and Memory: Cognition and memory normal.        Judgment: Judgment normal.    Assessment and Plan: Post partum depression Assessment & Plan: Zoloft 50 mg increased previously. No significant symptom changes. Tolerating well.  Happy with current dosing, no alternative medications desired. - Continue Zoloft 50 mg daily. - Reassess if symptoms worsen. - Denies need for CBT    Has all appointments scheduled for specialists: Allergist, UroGyn, and Endocrine  Follow Up Instructions: Return in about 6 months (around 01/10/2024) for annual physical.   I discussed the assessment and treatment plan with the patient. The patient was provided an opportunity to ask questions, and all were answered. The patient agreed with the plan and demonstrated an understanding of the instructions.  The patient was advised to call back or seek an in-person evaluation if the symptoms worsen or if the condition fails to improve as anticipated.  The above assessment and management plan was discussed with the patient. The patient verbalized understanding of and has agreed to the management plan.   Samantha Provencal, FNP

## 2023-07-13 NOTE — Progress Notes (Unsigned)
 New Patient Note  RE: Samantha Bailey MRN: [id number] DOB: Apr 04, 1991 Date of Office Visit: 07/14/2023  Consult requested by: Sallee Provencal, FNP Primary care provider: Sallee Provencal, FNP  Chief Complaint: No chief complaint on file.  History of Present Illness: I had the pleasure of seeing Samantha Bailey for initial evaluation at the [facility] on 07/13/2023. She is a 33 y.o. female, who is referred here by Sallee Provencal, FNP for the evaluation of shellfish allergy.  Discussed the use of AI scribe software for clinical note transcription with the patient, who gave verbal consent to proceed.  History of Present Illness             She reports food allergy to ***. The reaction occurred at the age of ***, after she ate *** amount of ***. Symptoms started within *** and was in the form of *** hives, swelling, wheezing, abdominal pain, diarrhea, vomiting. ***Denies any associated cofactors such as exertion, infection, NSAID use, or alcohol consumption. The symptoms lasted for ***. She was evaluated in ED and received ***. Since this episode, she does *** not report other accidental exposures to ***. She does *** not have access to epinephrine autoinjector and *** needed to use it.   Past work up includes: ***. Dietary History: patient has been eating other foods including ***milk, ***eggs, ***peanut, ***treenuts, ***sesame, ***shellfish, ***fish, ***soy, ***wheat, ***meats, ***fruits and ***vegetables.  She reports reading labels and avoiding *** in diet completely. She tolerates ***baked egg and baked milk products.   Assessment and Plan: Samantha Bailey is a 33 y.o. female with: ***  Assessment and Plan               No follow-ups on file.  No orders of the defined types were placed in this encounter.  Lab Orders  No laboratory test(s) ordered today    Other allergy screening: Asthma: {Blank single:19197::"yes","no"} Rhino conjunctivitis:  {Blank single:19197::"yes","no"} Food allergy: {Blank single:19197::"yes","no"} Medication allergy: {Blank single:19197::"yes","no"} Hymenoptera allergy: {Blank single:19197::"yes","no"} Urticaria: {Blank single:19197::"yes","no"} Eczema:{Blank single:19197::"yes","no"} History of recurrent infections suggestive of immunodeficency: {Blank single:19197::"yes","no"}  Diagnostics: Spirometry:  Tracings reviewed. Her effort: {Blank single:19197::"Good reproducible efforts.","It was hard to get consistent efforts and there is a question as to whether this reflects a maximal maneuver.","Poor effort, data can not be interpreted."} FVC: ***L FEV1: ***L, ***% predicted FEV1/FVC ratio: ***% Interpretation: {Blank single:19197::"Spirometry consistent with mild obstructive disease","Spirometry consistent with moderate obstructive disease","Spirometry consistent with severe obstructive disease","Spirometry consistent with possible restrictive disease","Spirometry consistent with mixed obstructive and restrictive disease","Spirometry uninterpretable due to technique","Spirometry consistent with normal pattern","No overt abnormalities noted given today's efforts"}.  Please see scanned spirometry results for details.  Skin Testing: {Blank single:19197::"Select foods","Environmental allergy panel","Environmental allergy panel and select foods","Food allergy panel","None","Deferred due to recent antihistamines use"}. *** Results discussed with patient/family.   Past Medical History: Patient Active Problem List   Diagnosis Date Noted  . 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  . Class 1 obesity due to excess calories without serious comorbidity with body mass index (BMI) of 33.0 to 33.9 in adult 06/17/2023  . Shellfish allergy 06/17/2023  . PCOS (polycystic ovarian syndrome) 06/17/2023  . Numerous moles 06/17/2023  . History of infertility 06/17/2023  . Rectocele 06/17/2023  . Pregnancy 04/24/2023  .  Gestational hypertension 09/18/2021  . Liver laceration, closed 06/20/2019  . Insomnia 02/03/2017  . Acne rosacea, papular type 10/12/2015  . Allergic rhinitis 10/12/2015   Past Medical History:  Diagnosis Date  . Allergic  rhinitis   . Chlamydia 2008  . Infertility, female   . Medical history non-contributory   . Multiple food allergies   . PCOS (polycystic ovarian syndrome)   . Pregnancy induced hypertension    Past Surgical History: Past Surgical History:  Procedure Laterality Date  . IR ANGIOGRAM SELECTIVE EACH ADDITIONAL VESSEL  06/20/2019  . IR ANGIOGRAM SELECTIVE EACH ADDITIONAL VESSEL  06/20/2019  . IR ANGIOGRAM SELECTIVE EACH ADDITIONAL VESSEL  06/20/2019  . IR ANGIOGRAM SELECTIVE EACH ADDITIONAL VESSEL  06/20/2019  . IR ANGIOGRAM SELECTIVE EACH ADDITIONAL VESSEL  06/20/2019  . IR ANGIOGRAM SELECTIVE EACH ADDITIONAL VESSEL  06/20/2019  . IR ANGIOGRAM SELECTIVE EACH ADDITIONAL VESSEL  06/20/2019  . IR ANGIOGRAM VISCERAL SELECTIVE  06/20/2019  . IR EMBO ART  VEN HEMORR LYMPH EXTRAV  INC GUIDE ROADMAPPING  06/20/2019  . IR US GUIDE VASC ACCESS RIGHT  06/20/2019  . WISDOM TOOTH EXTRACTION  2013   no problems with anesthesia   Medication List:  Current Outpatient Medications  Medication Sig Dispense Refill  . mometasone (ELOCON) 0.1 % lotion Apply 3-5 drops in the ears 2 (two) times a week at bedtime. 30 mL 11  . sertraline (ZOLOFT) 50 MG tablet Take 1 tablet (50 mg total) by mouth daily. 30 tablet 3   No current facility-administered medications for this visit.   Allergies: Allergies  Allergen Reactions  . Shellfish Allergy Anaphylaxis  . Shellfish Allergy Swelling, Other (See Comments) and Itching   Social History: Social History   Socioeconomic History  . Marital status: Married    Spouse name: Jomarie Longs  . Number of children: 0  . Years of education: college  . Highest education level: Associate degree: occupational, Scientist, product/process development, or vocational program  Occupational  History    Employer: Bartender    Comment: [employer]  . Occupation: Consulting civil engineer    Comment: Licensed conveyancer at [employer]  . Occupation: Charity fundraiser  Tobacco Use  . Smoking status: Never  . Smokeless tobacco: Never  Vaping Use  . Vaping status: Never Used  Substance and Sexual Activity  . Alcohol use: No  . Drug use: Never  . Sexual activity: Yes    Partners: Male    Birth control/protection: Condom, None  Other Topics Concern  . Not on file  Social History Narrative   ** Merged History Encounter **       Social Drivers of Health   Financial Resource Strain: Low Risk  (06/15/2023)   Overall Financial Resource Strain (CARDIA)   . Difficulty of Paying Living Expenses: Not hard at all  Food Insecurity: No Food Insecurity (06/15/2023)   Hunger Vital Sign   . Worried About Programme researcher, broadcasting/film/video in the Last Year: Never true   . Ran Out of Food in the Last Year: Never true  Transportation Needs: No Transportation Needs (06/15/2023)   PRAPARE - Transportation   . Lack of Transportation (Medical): No   . Lack of Transportation (Non-Medical): No  Physical Activity: Sufficiently Active (06/15/2023)   Exercise Vital Sign   . Days of Exercise per Week: 5 days   . Minutes of Exercise per Session: 50 min  Stress: No Stress Concern Present (06/15/2023)   [employer] of Occupational Health - Occupational Stress Questionnaire   . Feeling of Stress : Not at all  Social Connections: Moderately Isolated (06/15/2023)   Social Connection and Isolation Panel [NHANES]   . Frequency of Communication with Friends and Family: Twice a week   . Frequency of  Social Gatherings with Friends and Family: More than three times a week   . Attends Religious Services: Never   . Active Member of Clubs or Organizations: No   . Attends Banker Meetings: Not on file   . Marital Status: Married   Lives in a ***. Smoking: *** Occupation: ***  Environmental HistorySurveyor, minerals in the  house: Copywriter, advertising in the family room: {Blank single:19197::"yes","no"} Carpet in the bedroom: {Blank single:19197::"yes","no"} Heating: {Blank single:19197::"electric","gas","heat pump"} Cooling: {Blank single:19197::"central","window","heat pump"} Pet: {Blank single:19197::"yes ***","no"}  Family History: Family History  Problem Relation Age of Onset  . Healthy Mother   . Varicose Veins Mother   . Alcoholism Mother   . Obesity Mother   . Hypertension Father   . Healthy Maternal Grandmother   . Healthy Maternal Grandfather   . Healthy Paternal Grandmother   . Healthy Paternal Grandfather   . Breast cancer Neg Hx   . Cancer - Colon Neg Hx   . Cervical cancer Neg Hx   . Colon cancer Neg Hx   . Ovarian cancer Neg Hx    Problem                               Relation Asthma                                   *** Eczema                                *** Food allergy                          *** Allergic rhino conjunctivitis     ***  Review of Systems  Constitutional:  Negative for appetite change, chills, fever and unexpected weight change.  HENT:  Negative for congestion and rhinorrhea.   Eyes:  Negative for itching.  Respiratory:  Negative for cough, chest tightness, shortness of breath and wheezing.   Cardiovascular:  Negative for chest pain.  Gastrointestinal:  Negative for abdominal pain.  Genitourinary:  Negative for difficulty urinating.  Skin:  Negative for rash.  Neurological:  Negative for headaches.   Objective: LMP 06/17/2023  There is no height or weight on file to calculate BMI. Physical Exam Vitals and nursing note reviewed.  Constitutional:      Appearance: Normal appearance. She is well-developed.  HENT:     Head: Normocephalic and atraumatic.     Right Ear: Tympanic membrane and external ear normal.     Left Ear: Tympanic membrane and external ear normal.     Nose: Nose normal.     Mouth/Throat:     Mouth: Mucous  membranes are moist.     Pharynx: Oropharynx is clear.  Eyes:     Conjunctiva/sclera: Conjunctivae normal.  Cardiovascular:     Rate and Rhythm: Normal rate and regular rhythm.     Heart sounds: Normal heart sounds. No murmur heard.    No friction rub. No gallop.  Pulmonary:     Effort: Pulmonary effort is normal.     Breath sounds: Normal breath sounds. No wheezing, rhonchi or rales.  Musculoskeletal:     Cervical back: Neck supple.  Skin:    General: Skin is warm.     Findings:  No rash.  Neurological:     Mental Status: She is alert and oriented to person, place, and time.  Psychiatric:        Behavior: Behavior normal.  The plan was reviewed with the patient/family, and all questions/concerned were addressed.  It was my pleasure to see Samantha Bailey today and participate in her care. Please feel free to contact me with any questions or concerns.  Sincerely,  Wyline Mood, DO Allergy & Immunology  [facility] office: [phone] [facility] office: [phone]

## 2023-07-14 ENCOUNTER — Other Ambulatory Visit (HOSPITAL_COMMUNITY): Payer: Self-pay

## 2023-07-14 ENCOUNTER — Encounter: Payer: Self-pay | Admitting: Allergy

## 2023-07-14 ENCOUNTER — Other Ambulatory Visit: Payer: Self-pay

## 2023-07-14 ENCOUNTER — Encounter: Payer: Commercial Managed Care - PPO | Admitting: Urology

## 2023-07-14 ENCOUNTER — Ambulatory Visit (INDEPENDENT_AMBULATORY_CARE_PROVIDER_SITE_OTHER): Payer: Commercial Managed Care - PPO | Admitting: Allergy

## 2023-07-14 VITALS — BP 122/76 | HR 82 | Temp 98.4°F | Resp 16 | Ht 62.6 in | Wt 185.7 lb

## 2023-07-14 DIAGNOSIS — J3089 Other allergic rhinitis: Secondary | ICD-10-CM | POA: Diagnosis not present

## 2023-07-14 DIAGNOSIS — T781XXD Other adverse food reactions, not elsewhere classified, subsequent encounter: Secondary | ICD-10-CM

## 2023-07-14 MED ORDER — FLUTICASONE PROPIONATE 50 MCG/ACT NA SUSP
1.0000 | Freq: Every day | NASAL | 5 refills | Status: AC | PRN
  Filled 2023-07-14: qty 16, 30d supply, fill #0

## 2023-07-14 MED ORDER — AZELASTINE HCL 0.1 % NA SOLN
1.0000 | Freq: Two times a day (BID) | NASAL | 5 refills | Status: AC | PRN
  Filled 2023-07-14: qty 30, 25d supply, fill #0

## 2023-07-14 NOTE — Patient Instructions (Addendum)
 Today's skin testing positive to grass, weed, trees, dust mites, cat, dog, cockroaches. Positive to shrimp. Borderline to crab and lobster.   Results given.  Environmental allergies Start environmental control measures as below. Use over the counter antihistamines such as Zyrtec (cetirizine), Claritin (loratadine), Allegra (fexofenadine), or Xyzal (levocetirizine) daily as needed. May take twice a day during allergy flares. May switch antihistamines every few months. Use Flonase (fluticasone) nasal spray 1-2 sprays per nostril once a day as needed for nasal congestion.  Use azelastine nasal spray 1-2 sprays per nostril twice a day as needed for runny nose/drainage. Nasal saline spray (i.e., Simply Saline) or nasal saline lavage (i.e., NeilMed) is recommended as needed and prior to medicated nasal sprays. Start allergy injections. 2 injections. Let us know when ready to start.  Had a detailed discussion with patient/family that clinical history is suggestive of allergic rhinitis, and may benefit from allergy immunotherapy (AIT). Discussed in detail regarding the dosing, schedule, side effects (mild to moderate local allergic reaction and rarely systemic allergic reactions including anaphylaxis), and benefits (significant improvement in nasal symptoms, seasonal flares of asthma) of immunotherapy with the patient. There is significant time commitment involved with allergy shots, which includes weekly immunotherapy injections for first 9-12 months and then biweekly to monthly injections for 3-5 years. Consent was signed.  Foods Continue to avoid shellfish. For mild symptoms you can take over the counter antihistamines such as Benadryl 1-2 tablets = 25-50mg  and monitor symptoms closely. If symptoms worsen or if you have severe symptoms including breathing issues, throat closure, significant swelling, whole body hives, severe diarrhea and vomiting, lightheadedness then inject epinephrine and seek  immediate medical care afterwards. Emergency action plan in place.   Return in about 6 months (around 01/14/2024). Or sooner if needed.   Reducing Pollen Exposure Pollen seasons: trees (spring), grass (summer) and ragweed/weeds (fall). Keep windows closed in your home and car to lower pollen exposure.  Install air conditioning in the bedroom and throughout the house if possible.  Avoid going out in dry windy days - especially early morning. Pollen counts are highest between 5 - 10 AM and on dry, hot and windy days.  Save outside activities for late afternoon or after a heavy rain, when pollen levels are lower.  Avoid mowing of grass if you have grass pollen allergy. Be aware that pollen can also be transported indoors on people and pets.  Dry your clothes in an automatic dryer rather than hanging them outside where they might collect pollen.  Rinse hair and eyes before bedtime.  Control of House Dust Mite Allergen Dust mite allergens are a common trigger of allergy and asthma symptoms. While they can be found throughout the house, these microscopic creatures thrive in warm, humid environments such as bedding, upholstered furniture and carpeting. Because so much time is spent in the bedroom, it is essential to reduce mite levels there.  Encase pillows, mattresses, and box springs in special allergen-proof fabric covers or airtight, zippered plastic covers.  Bedding should be washed weekly in hot water (130 F) and dried in a hot dryer. Allergen-proof covers are available for comforters and pillows that can't be regularly washed.  Wash the allergy-proof covers every few months. Minimize clutter in the bedroom. Keep pets out of the bedroom.  Keep humidity less than 50% by using a dehumidifier or air conditioning. You can buy a humidity measuring device called a hygrometer to monitor this.  If possible, replace carpets with hardwood, linoleum, or washable area rugs.  If that's not possible, vacuum  frequently with a vacuum that has a HEPA filter. Remove all upholstered furniture and non-washable window drapes from the bedroom. Remove all non-washable stuffed toys from the bedroom.  Wash stuffed toys weekly.  Pet Allergen Avoidance: Contrary to popular opinion, there are no "hypoallergenic" breeds of dogs or cats. That is because people are not allergic to an animal's hair, but to an allergen found in the animal's saliva, dander (dead skin flakes) or urine. Pet allergy symptoms typically occur within minutes. For some people, symptoms can build up and become most severe 8 to 12 hours after contact with the animal. People with severe allergies can experience reactions in public places if dander has been transported on the pet owners' clothing. Keeping an animal outdoors is only a partial solution, since homes with pets in the yard still have higher concentrations of animal allergens. Before getting a pet, ask your allergist to determine if you are allergic to animals. If your pet is already considered part of your family, try to minimize contact and keep the pet out of the bedroom and other rooms where you spend a great deal of time. As with dust mites, vacuum carpets often or replace carpet with a hardwood floor, tile or linoleum. High-efficiency particulate air (HEPA) cleaners can reduce allergen levels over time. While dander and saliva are the source of cat and dog allergens, urine is the source of allergens from rabbits, hamsters, mice and [place] pigs; so ask a non-allergic family member to clean the animal's cage. If you have a pet allergy, talk to your allergist about the potential for allergy immunotherapy (allergy shots). This strategy can often provide long-term relief.  Cockroach Allergen Avoidance Cockroaches are often found in the homes of densely populated urban areas, schools or commercial buildings, but these creatures can lurk almost anywhere. This does not mean that you have a  dirty house or living area. Block all areas where roaches can enter the home. This includes crevices, wall cracks and windows.  Cockroaches need water to survive, so fix and seal all leaky faucets and pipes. Have an exterminator go through the house when your family and pets are gone to eliminate any remaining roaches. Keep food in lidded containers and put pet food dishes away after your pets are done eating. Vacuum and sweep the floor after meals, and take out garbage and recyclables. Use lidded garbage containers in the kitchen. Wash dishes immediately after use and clean under stoves, refrigerators or toasters where crumbs can accumulate. Wipe off the stove and other kitchen surfaces and cupboards regularly.

## 2023-07-15 ENCOUNTER — Other Ambulatory Visit (HOSPITAL_COMMUNITY): Payer: Self-pay

## 2023-07-15 ENCOUNTER — Ambulatory Visit (INDEPENDENT_AMBULATORY_CARE_PROVIDER_SITE_OTHER): Payer: Commercial Managed Care - PPO | Admitting: Family Medicine

## 2023-07-15 ENCOUNTER — Encounter (INDEPENDENT_AMBULATORY_CARE_PROVIDER_SITE_OTHER): Payer: Self-pay | Admitting: Family Medicine

## 2023-07-15 VITALS — BP 96/67 | HR 88 | Temp 98.2°F | Ht 63.0 in | Wt 180.0 lb

## 2023-07-15 DIAGNOSIS — E559 Vitamin D deficiency, unspecified: Secondary | ICD-10-CM | POA: Diagnosis not present

## 2023-07-15 DIAGNOSIS — E66811 Obesity, class 1: Secondary | ICD-10-CM | POA: Diagnosis not present

## 2023-07-15 DIAGNOSIS — N816 Rectocele: Secondary | ICD-10-CM

## 2023-07-15 DIAGNOSIS — Z6831 Body mass index (BMI) 31.0-31.9, adult: Secondary | ICD-10-CM | POA: Diagnosis not present

## 2023-07-15 MED ORDER — VITAMIN D (ERGOCALCIFEROL) 1.25 MG (50000 UNIT) PO CAPS
50000.0000 [IU] | ORAL_CAPSULE | ORAL | 0 refills | Status: DC
  Filled 2023-07-15: qty 4, 28d supply, fill #0

## 2023-07-15 MED ORDER — WEGOVY 0.25 MG/0.5ML ~~LOC~~ SOAJ
0.2500 mg | SUBCUTANEOUS | 0 refills | Status: DC
  Filled 2023-07-15 – 2023-07-21 (×3): qty 2, 28d supply, fill #0

## 2023-07-15 NOTE — Assessment & Plan Note (Signed)
 Discussed importance of vitamin d supplementation.  Vitamin d supplementation has been shown to decrease fatigue, decrease risk of progression to insulin resistance and then prediabetes, decreases risk of falling in older age and can even assist in decreasing depressive symptoms in PTSD.   Prescription for Vitamin D sent in.

## 2023-07-15 NOTE — Assessment & Plan Note (Signed)
 Patient is on wait list for pelvic floor PT.  She starts officially in June and has her appointments already scheduled.

## 2023-07-15 NOTE — Progress Notes (Signed)
 SUBJECTIVE:  Chief Complaint: Obesity  Interim History: Patient felt the first 3 weeks were a bit boring but she is getting using to it.  She has done well with getting all the food in and is starting to get hungry lunch (2-3 hours) and later in the evening. She is interested in starting [place].  She is starting work again next week after maternity leave. She is apprehensive about that.  Samantha Bailey is here to discuss her progress with her obesity treatment plan. She is on the Category 2 Plan and states she is following her eating plan approximately 85 % of the time. She states she is exercising HIIT on you tube for 45 minutes 4-5 times per week.   OBJECTIVE: Visit Diagnoses: Problem List Items Addressed This Visit       Digestive   Rectocele   Patient is on wait list for pelvic floor PT.  She starts officially in June and has her appointments already scheduled.        Other   Vitamin D deficiency   Discussed importance of vitamin d supplementation.  Vitamin d supplementation has been shown to decrease fatigue, decrease risk of progression to insulin resistance and then prediabetes, decreases risk of falling in older age and can even assist in decreasing depressive symptoms in PTSD.   Prescription for Vitamin D sent in.        Relevant Medications   Vitamin D, Ergocalciferol, (DRISDOL) 1.25 MG (50000 UNIT) CAPS capsule   Other Visit Diagnoses       Class 1 obesity with serious comorbidity and body mass index (BMI) of 32.0 to 32.9 in adult, unspecified obesity type    -  Primary   Relevant Medications   Semaglutide-Weight Management (WEGOVY) 0.25 MG/0.5ML SOAJ       Vitals Temp: 98.2 F (36.8 C) BP: 96/67 Pulse Rate: 88 SpO2: 98 %   Anthropometric Measurements Height: 5\' 3"  (1.6 m) Weight: 180 lb (81.6 kg) BMI (Calculated): 31.89 Weight at Last Visit: 182 lb Weight Lost Since Last Visit: 2 lb Weight Gained Since Last Visit: 0 Starting Weight: 182 lb Total Weight  Loss (lbs): 2 lb (0.907 kg) Peak Weight: 182 lb   Body Composition  Body Fat %: 38 % Fat Mass (lbs): 68.4 lbs Muscle Mass (lbs): 106.2 lbs Total Body Water (lbs): 73.2 lbs Visceral Fat Rating : 7   Other Clinical Data Fasting: no Labs: no Today's Visit #: 2 Starting Date: 06/24/23     ASSESSMENT AND PLAN:  Diet: Samantha Bailey is currently in the action stage of change. As such, her goal is to continue with weight loss efforts and has agreed to the Category 2 Plan and the Category 3 Plan.  Patient will do lunch and dinner from Category 3 and breakfast and snacks from Category 2.  Exercise:  All adults should avoid inactivity. Some activity is better than none, and adults who participate in any amount of physical activity, gain some health benefits.  Behavior Modification:  We discussed the following Behavioral Modification Strategies today: increasing lean protein intake, increasing vegetables, meal planning and cooking strategies, and planning for success. We discussed various medication options to help Samantha Bailey with her weight loss efforts and we both agreed to start [facility] 0.25mg  weekly for 1 month.  Return in about 3 weeks (around 08/05/2023).Marland Kitchen She was informed of the importance of frequent follow up visits to maximize her success with intensive lifestyle modifications for her multiple health conditions.  Attestation Statements:   Reviewed  by clinician on day of visit: allergies, medications, problem list, medical history, surgical history, family history, social history, and previous encounter notes.   Time spent on visit including pre-visit chart review and post-visit care and charting was 25 minutes  Reuben Likes, MD

## 2023-07-15 NOTE — Telephone Encounter (Signed)
 PA started

## 2023-07-21 ENCOUNTER — Other Ambulatory Visit: Payer: Self-pay | Admitting: Allergy

## 2023-07-21 ENCOUNTER — Other Ambulatory Visit (HOSPITAL_COMMUNITY): Payer: Self-pay

## 2023-07-21 DIAGNOSIS — J3089 Other allergic rhinitis: Secondary | ICD-10-CM

## 2023-07-22 DIAGNOSIS — J301 Allergic rhinitis due to pollen: Secondary | ICD-10-CM | POA: Diagnosis not present

## 2023-07-22 NOTE — Progress Notes (Signed)
 Aeroallergen Immunotherapy  Ordering Provider: Dr. Wyline Mood  Patient Details Name: Samantha Bailey MRN: [id number] Date of Birth: 07-03-1990  Order 1 of 2  Vial Label: G-W-T  0.3 ml (Volume)  BAU Concentration -- 7 Grass Mix* 100,000 ([address] [place], [place], [place], [place], [place], Sweet Vernal, [place]) 0.2 ml (Volume)  1:20 Concentration -- [place] 0.3 ml (Volume)  BAU Concentration -- [place] 10,000 0.2 ml (Volume)  1:20 Concentration -- [place] Thistle 0.5 ml (Volume)  1:20 Concentration -- Weed Mix* 0.5 ml (Volume)  1:20 Concentration -- Eastern 10 Tree Mix (also Sweet Gum) 0.2 ml (Volume)  1:10 Concentration -- Pecan Pollen 0.2 ml (Volume)  1:20 Concentration -- [place], Black Pollen   2.4  ml Extract Subtotal 2.6  ml Diluent 5.0  ml Maintenance Total  Schedule:  B Silver Vial (1:1,000,000): Schedule B (6 doses) Blue Vial (1:100,000): Schedule B (6 doses) Yellow Vial (1:10,000): Schedule B (6 doses) Green Vial (1:1,000): Schedule B (6 doses) Red Vial (1:100): Schedule A (14 doses)  Special Instructions: May come in 1-2 times a week during build up as tolerated. Once a week on red vial. Once she is on red vial #1 0.5cc go every 2 weeks, on red vial #2 0.5cc go every 4 weeks. May build up red vials faster (0.1, 0.3, 0.5).

## 2023-07-22 NOTE — Progress Notes (Signed)
 VIAL SET ONE G-W-T MADE 07-22-23.  EXP 07-21-24

## 2023-07-22 NOTE — Progress Notes (Signed)
 Aeroallergen Immunotherapy  Ordering Provider: Dr. Wyline Mood  Patient Details Name: Samantha Bailey MRN: [id number] Date of Birth: 01-15-1991  Order 2 of 2  Vial Label: Dm-C-D-Cr  0.5 ml (Volume)  1:10 Concentration -- Cat Hair 0.3 ml (Volume)  1:20 Concentration -- Cockroach, [place] 0.5 ml (Volume)  1:10 Concentration -- Dog Epithelia 0.5 ml (Volume)   AU Concentration -- Mite Mix (DF 5,000 & DP 5,000)   1.8  ml Extract Subtotal 3.2  ml Diluent 5.0  ml Maintenance Total  Schedule:  B Silver Vial (1:1,000,000): Schedule B (6 doses) Blue Vial (1:100,000): Schedule B (6 doses) Yellow Vial (1:10,000): Schedule B (6 doses) Green Vial (1:1,000): Schedule B (6 doses) Red Vial (1:100): Schedule A (14 doses)  Special Instructions: May come in 1-2 times a week during build up as tolerated. Once a week on red vial. Once she is on red vial #1 0.5cc go every 2 weeks, on red vial #2 0.5cc go every 4 weeks. May build up red vials faster (0.1, 0.3, 0.5).

## 2023-07-23 ENCOUNTER — Other Ambulatory Visit (HOSPITAL_COMMUNITY): Payer: Self-pay

## 2023-07-23 DIAGNOSIS — J3081 Allergic rhinitis due to animal (cat) (dog) hair and dander: Secondary | ICD-10-CM | POA: Diagnosis not present

## 2023-07-30 ENCOUNTER — Ambulatory Visit (INDEPENDENT_AMBULATORY_CARE_PROVIDER_SITE_OTHER): Admitting: Family Medicine

## 2023-08-01 DIAGNOSIS — H6063 Unspecified chronic otitis externa, bilateral: Secondary | ICD-10-CM | POA: Diagnosis not present

## 2023-08-01 DIAGNOSIS — H6123 Impacted cerumen, bilateral: Secondary | ICD-10-CM | POA: Diagnosis not present

## 2023-08-05 ENCOUNTER — Ambulatory Visit

## 2023-08-05 DIAGNOSIS — J309 Allergic rhinitis, unspecified: Secondary | ICD-10-CM

## 2023-08-05 NOTE — Progress Notes (Signed)
 Immunotherapy   Patient Details  Name: Samantha Bailey MRN: [id number] Date of Birth: 09-03-1990  08/05/2023  [name] [facility] started injections for  Grass-Weed-Tree and Dustmite-Cat-Dog-Cockroach. Patient stayed in clinic for 30 minutes with no issues.  Following schedule: B  Frequency: 1-2x weekly Epi-Pen:Epi-Pen Available  Consent signed and patient instructions given.   [name] 08/05/2023, 5:32 PM

## 2023-08-11 ENCOUNTER — Other Ambulatory Visit: Payer: Self-pay

## 2023-08-11 ENCOUNTER — Other Ambulatory Visit (HOSPITAL_COMMUNITY): Payer: Self-pay

## 2023-08-12 ENCOUNTER — Other Ambulatory Visit (HOSPITAL_COMMUNITY): Payer: Self-pay

## 2023-08-15 ENCOUNTER — Ambulatory Visit (INDEPENDENT_AMBULATORY_CARE_PROVIDER_SITE_OTHER): Payer: Self-pay

## 2023-08-15 DIAGNOSIS — J309 Allergic rhinitis, unspecified: Secondary | ICD-10-CM | POA: Diagnosis not present

## 2023-08-19 ENCOUNTER — Ambulatory Visit (INDEPENDENT_AMBULATORY_CARE_PROVIDER_SITE_OTHER): Admitting: Family Medicine

## 2023-08-19 ENCOUNTER — Other Ambulatory Visit (HOSPITAL_COMMUNITY): Payer: Self-pay

## 2023-08-19 ENCOUNTER — Encounter (INDEPENDENT_AMBULATORY_CARE_PROVIDER_SITE_OTHER): Payer: Self-pay | Admitting: Family Medicine

## 2023-08-19 ENCOUNTER — Other Ambulatory Visit: Payer: Self-pay

## 2023-08-19 VITALS — BP 101/68 | HR 104 | Temp 97.7°F | Ht 63.0 in | Wt 181.0 lb

## 2023-08-19 DIAGNOSIS — E66811 Obesity, class 1: Secondary | ICD-10-CM | POA: Diagnosis not present

## 2023-08-19 DIAGNOSIS — N816 Rectocele: Secondary | ICD-10-CM

## 2023-08-19 DIAGNOSIS — F53 Postpartum depression: Secondary | ICD-10-CM | POA: Diagnosis not present

## 2023-08-19 DIAGNOSIS — Z6832 Body mass index (BMI) 32.0-32.9, adult: Secondary | ICD-10-CM | POA: Diagnosis not present

## 2023-08-19 DIAGNOSIS — E6609 Other obesity due to excess calories: Secondary | ICD-10-CM

## 2023-08-19 DIAGNOSIS — E559 Vitamin D deficiency, unspecified: Secondary | ICD-10-CM | POA: Diagnosis not present

## 2023-08-19 MED ORDER — VITAMIN D (ERGOCALCIFEROL) 1.25 MG (50000 UNIT) PO CAPS
50000.0000 [IU] | ORAL_CAPSULE | ORAL | 0 refills | Status: DC
  Filled 2023-08-19: qty 12, 84d supply, fill #0

## 2023-08-19 MED ORDER — WEGOVY 0.5 MG/0.5ML ~~LOC~~ SOAJ
0.5000 mg | SUBCUTANEOUS | 0 refills | Status: DC
Start: 2023-08-19 — End: 2023-09-18
  Filled 2023-08-19: qty 2, 28d supply, fill #0

## 2023-08-19 NOTE — Progress Notes (Signed)
 SUBJECTIVE:  Chief Complaint: Obesity  Interim History: patient has been following meal plan and started the wegovy  last appointment.  No GI issues.  Has been working in the yard more.  She is back to her normal routine with work and home now.  Son's birthday is coming up and they are throwing a pool party.  Eating sandwich at lunch and dinner is a protein and vegetable.  She is making an at home chipotle bowl with protein and vegetables and a bit of rice.  She may occasionally get in a food indulgence.  Samantha Bailey is here to discuss her progress with her obesity treatment plan. She is on the Category 2 Plan and Category 3 Plan and states she is following her eating plan approximately 80 % of the time. She states she is exercising 45 minutes 3 times per week.   OBJECTIVE: Visit Diagnoses: Problem List Items Addressed This Visit       Digestive   Rectocele   Patient first pelvic floor PT appointment end of June.  Experiencing back pain currently which she attributes to her prolapse.          Other   Post partum depression   Doing well on sertraline . Has numerous refills left.  No suicidal or homicidal ideation.  Can continue to follow at subsequent appointments in terms of symptom management but no change to med or dose today.      Class 1 obesity due to excess calories without serious comorbidity with body mass index (BMI) of 33.0 to 33.9 in adult   Anthropometric Measurements Height: 5\' 3"  (1.6 m) Weight: 181 lb (82.1 kg) BMI (Calculated): 32.07 Weight at Last Visit: 180 lb Weight Lost Since Last Visit: 0 Weight Gained Since Last Visit: 1 Starting Weight: 182 lb Total Weight Loss (lbs): 1 lb (0.454 kg) Peak Weight: 182 lb Body Composition  Body Fat %: 37.1 % Fat Mass (lbs): 67.2 lbs Muscle Mass (lbs): 108 lbs Total Body Water (lbs): 73.4 lbs Visceral Fat Rating : 7 Other Clinical Data Today's Visit #: 3 Starting Date: 06/24/23       Relevant Medications    Semaglutide -Weight Management (WEGOVY ) 0.5 MG/0.5ML SOAJ   Vitamin D  deficiency   Doing well on prescription strength vitamin d .  Needs a refill today.      Relevant Medications   Vitamin D , Ergocalciferol , (DRISDOL ) 1.25 MG (50000 UNIT) CAPS capsule   Other Visit Diagnoses       Class 1 obesity with serious comorbidity and body mass index (BMI) of 32.0 to 32.9 in adult, unspecified obesity type    -  Primary   Relevant Medications   Semaglutide -Weight Management (WEGOVY ) 0.5 MG/0.5ML SOAJ     BMI 32.0-32.9,adult           No data recorded       08/27/2023   10:32 AM 08/19/2023   10:00 AM 07/15/2023   10:00 AM  Vitals with BMI  Height 5\' 3"  5\' 3"  5\' 3"   Weight 184 lbs 181 lbs 180 lbs  BMI 32.6 32.07 31.89  Systolic 104 101 96  Diastolic 80 68 67  Pulse 86 104 88      ASSESSMENT AND PLAN:  Diet: [place] is currently in the action stage of change. As such, her goal is to continue with weight loss efforts and has agreed to the Category 2 Plan and the Category 3 Plan.   Exercise:  For substantial health benefits, adults should do at least 150 minutes (  2 hours and 30 minutes) a week of moderate-intensity, or 75 minutes (1 hour and 15 minutes) a week of vigorous-intensity aerobic physical activity, or an equivalent combination of moderate- and vigorous-intensity aerobic activity. Aerobic activity should be performed in episodes of at least 10 minutes, and preferably, it should be spread throughout the week.  Behavior Modification:  We discussed the following Behavioral Modification Strategies today: increasing lean protein intake, decreasing simple carbohydrates, meal planning and cooking strategies, and avoiding temptations. We discussed various medication options to help Dakisha with her weight loss efforts and we both agreed to increase wegovy  to 0.5mg  weekly.  Return in about 3 weeks (around 09/09/2023).Aaron Aas She was informed of the importance of frequent follow up visits to  maximize her success with intensive lifestyle modifications for her multiple health conditions.  Attestation Statements:   Reviewed by clinician on day of visit: allergies, medications, problem list, medical history, surgical history, family history, social history, and previous encounter notes.   Donaciano Frizzle, MD

## 2023-08-19 NOTE — Assessment & Plan Note (Signed)
 Patient first pelvic floor PT appointment end of June.  Experiencing back pain currently which she attributes to her prolapse.

## 2023-08-20 ENCOUNTER — Other Ambulatory Visit

## 2023-08-27 ENCOUNTER — Ambulatory Visit (INDEPENDENT_AMBULATORY_CARE_PROVIDER_SITE_OTHER): Payer: Self-pay

## 2023-08-27 ENCOUNTER — Ambulatory Visit (INDEPENDENT_AMBULATORY_CARE_PROVIDER_SITE_OTHER): Admitting: "Endocrinology

## 2023-08-27 ENCOUNTER — Ambulatory Visit (INDEPENDENT_AMBULATORY_CARE_PROVIDER_SITE_OTHER): Admitting: Family Medicine

## 2023-08-27 ENCOUNTER — Encounter: Payer: Self-pay | Admitting: "Endocrinology

## 2023-08-27 VITALS — BP 104/80 | HR 86 | Ht 63.0 in | Wt 184.0 lb

## 2023-08-27 DIAGNOSIS — Z6832 Body mass index (BMI) 32.0-32.9, adult: Secondary | ICD-10-CM | POA: Diagnosis not present

## 2023-08-27 DIAGNOSIS — E282 Polycystic ovarian syndrome: Secondary | ICD-10-CM | POA: Diagnosis not present

## 2023-08-27 DIAGNOSIS — E6609 Other obesity due to excess calories: Secondary | ICD-10-CM

## 2023-08-27 DIAGNOSIS — E66811 Obesity, class 1: Secondary | ICD-10-CM

## 2023-08-27 DIAGNOSIS — J309 Allergic rhinitis, unspecified: Secondary | ICD-10-CM | POA: Diagnosis not present

## 2023-08-27 NOTE — Progress Notes (Signed)
 Outpatient Endocrinology Note Samantha Newcomer, MD    Samantha Bailey 01/15/1991 [id number]  Referring Provider: Tasia Farr, FNP Primary Care Provider: Tasia Farr, FNP Reason for consultation: Subjective   Assessment & Plan  Diagnoses and all orders for this visit:  PCOS (polycystic ovarian syndrome)  Class 1 obesity due to excess calories without serious comorbidity with body mass index (BMI) of 32.0 to 32.9 in adult    Patient was diagnosed with PCOS primary OB/GYN during her fertility treatment Currently has menstrual cycle every 5 weeks Patient has a child through IVF and then had a normal spontaneous pregnancy Patient is following with another physician for weight loss and is on Wegovy , low-dose currently.  Recommend dose escalation as tolerated once a month. Otherwise metabolically doing healthy, following 1200 cal a day No history of MEN syndrome/medullary thyroid  cancer/pancreatitis or pancreatic cancer in self or family Continue current regimen Recommend annual preventative labs with PCP  No follow-ups on file.   I have reviewed current medications, nurse's notes, allergies, vital signs, past medical and surgical history, family medical history, and social history for this encounter. Counseled patient on symptoms, examination findings, lab findings, imaging results, treatment decisions and monitoring and prognosis. The patient understood the recommendations and agrees with the treatment plan. All questions regarding treatment plan were fully answered.  Samantha Newcomer, MD  08/27/23   History of Present Illness HPI  Samantha Bailey is a 33 y.o. year old female who presents for evaluation of PCOS.  Patient was diagnosed with PCOS primary OB/GYN during her fertility treatment: Reports diagnosed with diagnosis of multiple follicles ovary on ultrasound as well as elevated testosterone . Currently has menstrual cycle every 5 weeks.  Patient  has a child through IVF and then had a normal spontaneous pregnancy.  Patient is following with another physician for weight loss and is on Wegovy , low-dose currently.  Otherwise metabolically doing healthy, following 1200 cal a day.  Component     Latest Ref Rng 06/17/2023 06/24/2023  Glucose     70 - 99 mg/dL 66 (L)    BUN     6 - 20 mg/dL 9    Creatinine     3.47 - 1.00 mg/dL 4.25    eGFR     >95 GL/OVF/6.43 105    BUN/Creatinine Ratio     9 - 23  12    Sodium     134 - 144 mmol/L 141    Potassium     3.5 - 5.2 mmol/L 4.4    Chloride     96 - 106 mmol/L 102    CO2     20 - 29 mmol/L 22    Calcium     8.7 - 10.2 mg/dL 9.8    Total Protein     6.0 - 8.5 g/dL 7.8    Albumin     4.0 - 5.0 g/dL 4.6    Globulin, Total     1.5 - 4.5 g/dL 3.2    Total Bilirubin     0.0 - 1.2 mg/dL 0.3    Alkaline Phosphatase     44 - 121 IU/L 83    AST     0 - 40 IU/L 27    ALT     0 - 32 IU/L 37 (H)    Cholesterol, Total     100 - 199 mg/dL 329    Triglycerides     0 - 149 mg/dL 518  HDL Cholesterol     >39 mg/dL 53    VLDL Cholesterol Cal     5 - 40 mg/dL 20    LDL Chol Calc (NIH)     0 - 99 mg/dL 86    Total CHOL/HDL Ratio     0.0 - 4.4 ratio 3.0    Hemoglobin A1C     4.8 - 5.6 % 5.4    Est. average glucose Bld gHb Est-mCnc     mg/dL 981    X9,JYNW(GNFAOZ)     0.82 - 1.77 ng/dL  3.08   Triiodothyronine (T3)     71 - 180 ng/dL  657   INSULIN      2.6 - 24.9 uIU/mL  6.6   TSH     0.450 - 4.500 uIU/mL  1.390      Physical Exam  BP 104/80   Pulse 86   Ht 5\' 3"  (1.6 m)   Wt 184 lb (83.5 kg)   SpO2 100%   BMI 32.59 kg/m    Constitutional: well developed, well nourished Head: normocephalic, atraumatic Eyes: sclera anicteric, no redness Neck: supple Lungs: normal respiratory effort Neurology: alert and oriented Skin: dry, no appreciable rashes Musculoskeletal: no appreciable defects Psychiatric: normal mood and affect   Current Medications Patient's  Medications  New Prescriptions   No medications on file  Previous Medications   AZELASTINE  (ASTELIN ) 0.1 % NASAL SPRAY    Place 1-2 sprays into both nostrils 2 (two) times daily as needed (nasal drainage). Use in each nostril as directed   EPINEPHRINE  0.3 MG/0.3 ML IJ SOAJ INJECTION    Inject 0.3 mg into the muscle as needed for anaphylaxis.   FLUTICASONE  (FLONASE ) 50 MCG/ACT NASAL SPRAY    Place 1-2 sprays into both nostrils daily as needed (nasal congestion).   MOMETASONE  (ELOCON ) 0.1 % LOTION    Apply 3-5 drops in the ears 2 (two) times a week at bedtime.   SEMAGLUTIDE -WEIGHT MANAGEMENT (WEGOVY ) 0.5 MG/0.5ML SOAJ    Inject 0.5 mg into the skin once a week.   SERTRALINE  (ZOLOFT ) 50 MG TABLET    Take 1 tablet (50 mg total) by mouth daily.   VITAMIN D , ERGOCALCIFEROL , (DRISDOL ) 1.25 MG (50000 UNIT) CAPS CAPSULE    Take 1 capsule (50,000 Units total) by mouth every 7 (seven) days.  Modified Medications   No medications on file  Discontinued Medications   No medications on file    Allergies Allergies  Allergen Reactions   Shellfish Allergy  Anaphylaxis   Shellfish Allergy  Swelling, Other (See Comments) and Itching    Past Medical History Past Medical History:  Diagnosis Date   Allergic rhinitis    Chlamydia 2008   Infertility, female    Medical history non-contributory    Multiple food allergies    PCOS (polycystic ovarian syndrome)    Pregnancy induced hypertension     Past Surgical History Past Surgical History:  Procedure Laterality Date   IR ANGIOGRAM SELECTIVE EACH ADDITIONAL VESSEL  06/20/2019   IR ANGIOGRAM SELECTIVE EACH ADDITIONAL VESSEL  06/20/2019   IR ANGIOGRAM SELECTIVE EACH ADDITIONAL VESSEL  06/20/2019   IR ANGIOGRAM SELECTIVE EACH ADDITIONAL VESSEL  06/20/2019   IR ANGIOGRAM SELECTIVE EACH ADDITIONAL VESSEL  06/20/2019   IR ANGIOGRAM SELECTIVE EACH ADDITIONAL VESSEL  06/20/2019   IR ANGIOGRAM SELECTIVE EACH ADDITIONAL VESSEL  06/20/2019   IR ANGIOGRAM VISCERAL  SELECTIVE  06/20/2019   IR EMBO ART  VEN HEMORR LYMPH EXTRAV  INC GUIDE ROADMAPPING  06/20/2019  IR US  GUIDE VASC ACCESS RIGHT  06/20/2019   WISDOM TOOTH EXTRACTION  2013   no problems with anesthesia    Family History family history includes Alcoholism in her mother; Allergic rhinitis in her father; Angioedema in her father; Eczema in her mother; Food Allergy  in her father; Healthy in her maternal grandfather, maternal grandmother, mother, paternal grandfather, and paternal grandmother; Hypertension in her father; Obesity in her mother; Varicose Veins in her mother.  Social History Social History   Socioeconomic History   Marital status: Married    Spouse name: Drexel Gentles   Number of children: 0   Years of education: college   Highest education level: Associate degree: occupational, Scientist, product/process development, or vocational program  Occupational History    Employer: [employer]    Comment: Tax inspector   Occupation: student    Comment: Nursing School at [employer]   Occupation: RN  Tobacco Use   Smoking status: Never    Passive exposure: Never   Smokeless tobacco: Never  Vaping Use   Vaping status: Never Used  Substance and Sexual Activity   Alcohol use: No   Drug use: Never   Sexual activity: Yes    Partners: Male    Birth control/protection: Condom, None  Other Topics Concern   Not on file  Social History Narrative   ** Merged History Encounter **       Social Drivers of Health   Financial Resource Strain: Low Risk  (06/15/2023)   Overall Financial Resource Strain (CARDIA)    Difficulty of Paying Living Expenses: Not hard at all  Food Insecurity: No Food Insecurity (06/15/2023)   Hunger Vital Sign    Worried About Running Out of Food in the Last Year: Never true    Ran Out of Food in the Last Year: Never true  Transportation Needs: No Transportation Needs (06/15/2023)   PRAPARE - Administrator, Civil Service (Medical): No    Lack of Transportation (Non-Medical): No   Physical Activity: Sufficiently Active (06/15/2023)   Exercise Vital Sign    Days of Exercise per Week: 5 days    Minutes of Exercise per Session: 50 min  Stress: No Stress Concern Present (06/15/2023)   [employer] of Occupational Health - Occupational Stress Questionnaire    Feeling of Stress : Not at all  Social Connections: Moderately Isolated (06/15/2023)   Social Connection and Isolation Panel [NHANES]    Frequency of Communication with Friends and Family: Twice a week    Frequency of Social Gatherings with Friends and Family: More than three times a week    Attends Religious Services: Never    Database administrator or Organizations: No    Attends Engineer, structural: Not on file    Marital Status: Married  Catering manager Violence: Not At Risk (04/24/2023)   Humiliation, Afraid, Rape, and Kick questionnaire    Fear of Current or Ex-Partner: No    Emotionally Abused: No    Physically Abused: No    Sexually Abused: No    Lab Results  Component Value Date   CHOL 159 06/17/2023   Lab Results  Component Value Date   HDL 53 06/17/2023   Lab Results  Component Value Date   LDLCALC 86 06/17/2023   Lab Results  Component Value Date   TRIG 108 06/17/2023   Lab Results  Component Value Date   CHOLHDL 3.0 06/17/2023   Lab Results  Component Value Date   CREATININE 0.78 06/17/2023  No results found for: "GFR"    Component Value Date/Time   NA 141 06/17/2023 1008   K 4.4 06/17/2023 1008   CL 102 06/17/2023 1008   CO2 22 06/17/2023 1008   GLUCOSE 66 (L) 06/17/2023 1008   GLUCOSE 94 04/17/2023 1605   BUN 9 06/17/2023 1008   CREATININE 0.78 06/17/2023 1008   CREATININE 0.75 02/03/2017 1155   CALCIUM 9.8 06/17/2023 1008   PROT 7.8 06/17/2023 1008   ALBUMIN 4.6 06/17/2023 1008   AST 27 06/17/2023 1008   ALT 37 (H) 06/17/2023 1008   ALKPHOS 83 06/17/2023 1008   BILITOT 0.3 06/17/2023 1008   GFRNONAA >60 04/17/2023 1605   GFRNONAA 112 02/03/2017  1155   GFRAA 116 07/19/2019 1448   GFRAA 129 02/03/2017 1155      Latest Ref Rng & Units 06/17/2023   10:08 AM 04/17/2023    4:05 PM 09/18/2021    4:44 PM  BMP  Glucose 70 - 99 mg/dL 66  94  85   BUN 6 - 20 mg/dL 9  9  7    Creatinine 0.57 - 1.00 mg/dL 1.61  0.96  0.45   BUN/Creat Ratio 9 - 23 12     Sodium 134 - 144 mmol/L 141  135  137   Potassium 3.5 - 5.2 mmol/L 4.4  3.9  3.9   Chloride 96 - 106 mmol/L 102  107  108   CO2 20 - 29 mmol/L 22  22  20    Calcium 8.7 - 10.2 mg/dL 9.8  9.4  9.0        Component Value Date/Time   WBC 3.7 06/17/2023 1008   WBC 13.5 (H) 04/25/2023 0428   RBC 5.17 06/17/2023 1008   RBC 3.86 (L) 04/25/2023 0428   HGB 13.6 06/17/2023 1008   HCT 43.1 06/17/2023 1008   PLT 360 06/17/2023 1008   MCV 83 06/17/2023 1008   MCH 26.3 (L) 06/17/2023 1008   MCH 27.5 04/25/2023 0428   MCHC 31.6 06/17/2023 1008   MCHC 33.1 04/25/2023 0428   RDW 13.6 06/17/2023 1008   LYMPHSABS 1.9 04/08/2018 0857   MONOABS 0.4 07/02/2011 1203   EOSABS 0.4 04/08/2018 0857   BASOSABS 0.1 04/08/2018 0857   Lab Results  Component Value Date   TSH 1.390 06/24/2023   TSH 3.340 04/08/2018   TSH 2.12 02/03/2017   FREET4 1.21 06/24/2023         Parts of this note may have been dictated using voice recognition software. There may be variances in spelling and vocabulary which are unintentional. Not all errors are proofread. Please notify the [facility] if any discrepancies are noted or if the meaning of any statement is not clear.

## 2023-08-30 NOTE — Assessment & Plan Note (Signed)
 Doing well on sertraline . Has numerous refills left.  No suicidal or homicidal ideation.  Can continue to follow at subsequent appointments in terms of symptom management but no change to med or dose today.

## 2023-08-30 NOTE — Assessment & Plan Note (Signed)
 Anthropometric Measurements Height: 5\' 3"  (1.6 m) Weight: 181 lb (82.1 kg) BMI (Calculated): 32.07 Weight at Last Visit: 180 lb Weight Lost Since Last Visit: 0 Weight Gained Since Last Visit: 1 Starting Weight: 182 lb Total Weight Loss (lbs): 1 lb (0.454 kg) Peak Weight: 182 lb Body Composition  Body Fat %: 37.1 % Fat Mass (lbs): 67.2 lbs Muscle Mass (lbs): 108 lbs Total Body Water (lbs): 73.4 lbs Visceral Fat Rating : 7 Other Clinical Data Today's Visit #: 3 Starting Date: 06/24/23

## 2023-08-30 NOTE — Assessment & Plan Note (Signed)
 Doing well on prescription strength vitamin d .  Needs a refill today.

## 2023-09-03 ENCOUNTER — Ambulatory Visit (INDEPENDENT_AMBULATORY_CARE_PROVIDER_SITE_OTHER)

## 2023-09-03 DIAGNOSIS — J309 Allergic rhinitis, unspecified: Secondary | ICD-10-CM

## 2023-09-16 ENCOUNTER — Ambulatory Visit (INDEPENDENT_AMBULATORY_CARE_PROVIDER_SITE_OTHER): Payer: Self-pay

## 2023-09-16 ENCOUNTER — Other Ambulatory Visit: Payer: Self-pay

## 2023-09-16 DIAGNOSIS — J309 Allergic rhinitis, unspecified: Secondary | ICD-10-CM | POA: Diagnosis not present

## 2023-09-18 ENCOUNTER — Other Ambulatory Visit: Payer: Self-pay

## 2023-09-18 ENCOUNTER — Ambulatory Visit (INDEPENDENT_AMBULATORY_CARE_PROVIDER_SITE_OTHER): Admitting: Family Medicine

## 2023-09-18 VITALS — BP 104/71 | HR 86 | Temp 98.1°F | Ht 63.0 in | Wt 179.0 lb

## 2023-09-18 DIAGNOSIS — Z6831 Body mass index (BMI) 31.0-31.9, adult: Secondary | ICD-10-CM

## 2023-09-18 DIAGNOSIS — E66811 Obesity, class 1: Secondary | ICD-10-CM | POA: Diagnosis not present

## 2023-09-18 DIAGNOSIS — E559 Vitamin D deficiency, unspecified: Secondary | ICD-10-CM

## 2023-09-18 DIAGNOSIS — Z6832 Body mass index (BMI) 32.0-32.9, adult: Secondary | ICD-10-CM

## 2023-09-18 MED ORDER — WEGOVY 1 MG/0.5ML ~~LOC~~ SOAJ
1.0000 mg | SUBCUTANEOUS | 0 refills | Status: DC
  Filled 2023-09-18: qty 2, 28d supply, fill #0

## 2023-09-18 NOTE — Assessment & Plan Note (Signed)
 On prescription strength Vitamin D .  No nausea, vomiting or muscle vomiting.  No refill needed.  Continue Rx.

## 2023-09-18 NOTE — Progress Notes (Signed)
   SUBJECTIVE:  Chief Complaint: Obesity  Interim History: Over the last few weeks patient has been working full time again-  6 shifts every 2 weeks and occasionally picking up shifts.  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  She is doing well on plan but is skipping meals on the days she works and often isn't eating dinner.   Samantha Bailey is here to discuss her progress with her obesity treatment plan. She is on the Category 2 Plan and Category 3 Plan and states she is following her eating plan approximately 70 % of the time. She states she is exercising 30-45 minutes 1-2 times per week.   OBJECTIVE: Visit Diagnoses: Problem List Items Addressed This Visit       Other   Class 1 obesity with serious comorbidity and body mass index (BMI) of 32.0 to 32.9 in adult - Primary   Anthropometric Measurements Height: 5\' 3"  (1.6 m) Weight: 179 lb (81.2 kg) BMI (Calculated): 31.72 Weight at Last Visit: 181 lb Weight Lost Since Last Visit: 2 Weight Gained Since Last Visit: 0 Starting Weight: 182 lb Total Weight Loss (lbs): 3 lb (1.361 kg) Peak Weight: 182 lb Body Composition  Body Fat %: 37.2 % Fat Mass (lbs): 66.6 lbs Muscle Mass (lbs): 106.8 lbs Total Body Water (lbs): 71.8 lbs Visceral Fat Rating : 6 Other Clinical Data Today's Visit #: 4 Starting Date: 06/24/23 Comments: Cat 2 and Cat 3       Relevant Medications   Semaglutide -Weight Management (WEGOVY ) 1 MG/0.5ML SOAJ   Vitamin D  deficiency   On prescription strength Vitamin D .  No nausea, vomiting or muscle vomiting.  No refill needed.  Continue Rx.       No data recorded       09/18/2023    9:00 AM 08/27/2023   10:32 AM 08/19/2023   10:00 AM  Vitals with BMI  Height 5\' 3"  5\' 3"  5\' 3"   Weight 179 lbs 184 lbs 181 lbs  BMI 31.72 32.6 32.07  Systolic 104 104 098  Diastolic 71 80 68  Pulse 86 86 104      ASSESSMENT AND PLAN:  Diet: [employer] is currently in the action stage of change. As such, her  goal is to continue with weight loss efforts and has agreed to the Category 2 Plan.   Exercise:  For substantial health benefits, adults should do at least 150 minutes (2 hours and 30 minutes) a week of moderate-intensity, or 75 minutes (1 hour and 15 minutes) a week of vigorous-intensity aerobic physical activity, or an equivalent combination of moderate- and vigorous-intensity aerobic activity. Aerobic activity should be performed in episodes of at least 10 minutes, and preferably, it should be spread throughout the week.  Behavior Modification:  We discussed the following Behavioral Modification Strategies today: increasing lean protein intake, decreasing simple carbohydrates, increasing vegetables, no skipping meals, and meal planning and cooking strategies.   Return in about 4 weeks (around 10/16/2023).   She was informed of the importance of frequent follow up visits to maximize her success with intensive lifestyle modifications for her multiple health conditions.  Attestation Statements:   Reviewed by clinician on day of visit: allergies, medications, problem list, medical history, surgical history, family history, social history, and previous encounter notes.     Donaciano Frizzle, MD

## 2023-09-24 ENCOUNTER — Ambulatory Visit (INDEPENDENT_AMBULATORY_CARE_PROVIDER_SITE_OTHER): Admitting: Family Medicine

## 2023-09-25 NOTE — Assessment & Plan Note (Signed)
 Anthropometric Measurements Height: 5\' 3"  (1.6 m) Weight: 179 lb (81.2 kg) BMI (Calculated): 31.72 Weight at Last Visit: 181 lb Weight Lost Since Last Visit: 2 Weight Gained Since Last Visit: 0 Starting Weight: 182 lb Total Weight Loss (lbs): 3 lb (1.361 kg) Peak Weight: 182 lb Body Composition  Body Fat %: 37.2 % Fat Mass (lbs): 66.6 lbs Muscle Mass (lbs): 106.8 lbs Total Body Water (lbs): 71.8 lbs Visceral Fat Rating : 6 Other Clinical Data Today's Visit #: 4 Starting Date: 06/24/23 Comments: Cat 2 and Cat 3

## 2023-10-11 ENCOUNTER — Other Ambulatory Visit: Payer: Self-pay | Admitting: Family Medicine

## 2023-10-11 DIAGNOSIS — F53 Postpartum depression: Secondary | ICD-10-CM

## 2023-10-12 ENCOUNTER — Encounter: Payer: Self-pay | Admitting: "Endocrinology

## 2023-10-12 ENCOUNTER — Other Ambulatory Visit: Payer: Self-pay | Admitting: Family Medicine

## 2023-10-12 ENCOUNTER — Other Ambulatory Visit: Payer: Self-pay

## 2023-10-12 DIAGNOSIS — F53 Postpartum depression: Secondary | ICD-10-CM

## 2023-10-13 ENCOUNTER — Other Ambulatory Visit: Payer: Self-pay

## 2023-10-13 MED FILL — Sertraline HCl Tab 50 MG: ORAL | 30 days supply | Qty: 30 | Fill #0 | Status: AC

## 2023-10-16 ENCOUNTER — Encounter: Payer: Self-pay | Admitting: Physical Therapy

## 2023-10-16 ENCOUNTER — Other Ambulatory Visit: Payer: Self-pay

## 2023-10-16 ENCOUNTER — Ambulatory Visit: Attending: Obstetrics and Gynecology | Admitting: Physical Therapy

## 2023-10-16 DIAGNOSIS — R2689 Other abnormalities of gait and mobility: Secondary | ICD-10-CM | POA: Insufficient documentation

## 2023-10-16 DIAGNOSIS — R293 Abnormal posture: Secondary | ICD-10-CM | POA: Insufficient documentation

## 2023-10-16 DIAGNOSIS — M533 Sacrococcygeal disorders, not elsewhere classified: Secondary | ICD-10-CM | POA: Insufficient documentation

## 2023-10-16 DIAGNOSIS — M5459 Other low back pain: Secondary | ICD-10-CM | POA: Insufficient documentation

## 2023-10-16 NOTE — Therapy (Addendum)
 OUTPATIENT PHYSICAL THERAPY EVALUATION   Patient Name: Samantha Bailey MRN: [id number] DOB:December 06, 1990, 33 y.o., female Today's Date: 10/16/2023    Past Medical History:  Diagnosis Date   Allergic rhinitis    Chlamydia 2008   Infertility, female    Medical history non-contributory    Multiple food allergies    PCOS (polycystic ovarian syndrome)    Pregnancy induced hypertension    Past Surgical History:  Procedure Laterality Date   IR ANGIOGRAM SELECTIVE EACH ADDITIONAL VESSEL  06/20/2019   IR ANGIOGRAM SELECTIVE EACH ADDITIONAL VESSEL  06/20/2019   IR ANGIOGRAM SELECTIVE EACH ADDITIONAL VESSEL  06/20/2019   IR ANGIOGRAM SELECTIVE EACH ADDITIONAL VESSEL  06/20/2019   IR ANGIOGRAM SELECTIVE EACH ADDITIONAL VESSEL  06/20/2019   IR ANGIOGRAM SELECTIVE EACH ADDITIONAL VESSEL  06/20/2019   IR ANGIOGRAM SELECTIVE EACH ADDITIONAL VESSEL  06/20/2019   IR ANGIOGRAM VISCERAL SELECTIVE  06/20/2019   IR EMBO ART  VEN HEMORR LYMPH EXTRAV  INC GUIDE ROADMAPPING  06/20/2019   IR US  GUIDE VASC ACCESS RIGHT  06/20/2019   WISDOM TOOTH EXTRACTION  2013   no problems with anesthesia   Patient Active Problem List   Diagnosis Date Noted   Vitamin D  deficiency 07/15/2023   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   Class 1 obesity with serious comorbidity and body mass index (BMI) of 32.0 to 32.9 in adult 06/17/2023   Shellfish allergy  06/17/2023   PCOS (polycystic ovarian syndrome) 06/17/2023   Numerous moles 06/17/2023   History of infertility 06/17/2023   Rectocele 06/17/2023   Pregnancy 04/24/2023   Gestational hypertension 09/18/2021   Liver laceration, closed 06/20/2019   Insomnia 02/03/2017   Acne rosacea, papular type 10/12/2015   Allergic rhinitis 10/12/2015    PCP: Wellington Beams FNP   REFERRING PROVIDER: Marne Marseille MD   REFERRING DIAG: rectocele   Rationale for Evaluation and Treatment Rehabilitation  THERAPY DIAG:  Other abnormalities of gait and mobility  Sacrococcygeal  disorders, not elsewhere classified  Abnormal posture  Other low back pain   ONSET DATE: 6 months ago after  delivering 2 child   SUBJECTIVE:                                                                                                                                                                                           SUBJECTIVE STATEMENT: 1) rectocele started 6 months ago after delivering 2nd child.  Excessive pushing was needed for her 10 lb baby . Son had shoulder dystocia. Pt felt a bulge out of her vagina. It is not hanging out but it is visible. When she inserts a tampon, she can feel  the rectal wall and it makes her feel she has to poop. BMs occur daily without straining. With urination, pt has to go again with the feeling of not finishing. No straining with urination.  During sexual intercourse, the prolapse causes discomfort and painful in certain positions some of the time.    2) LBP occurred 5 years ago. Persisted during 1st pregnancy 3 years ago but not worsen due pregnancy. Radiating pain down buttocks on R. Pt tried chiropractor Tx but it did not help. 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  Since this 2nd pregnancy, LBP increased from 3/10 to 5/10. Radiating pain to buttocks only occur with tweaking back, lifting , bending in the wrong way.  Pain occurs with lifting children ( 6 months old , 22 lbs, 2 years ago - 30 lbs)   Pt points from low rib to the iliac crest bilaterally dull ache.    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     PERTINENT HISTORY:    PAIN:  Are you having pain? Yes: see above  PRECAUTIONS: No  WEIGHT BEARING RESTRICTIONS: No  FALLS:  Has patient fallen in last 6 months? No  LIVING ENVIRONMENT: Lives with: family   Lives in: two story  Stairs: STE 2 with rail    OCCUPATION: RN   PLOF: IND   PATIENT GOALS:  LBP better managed and not worsen  prolapse    OBJECTIVE:   [facility] PT Assessment - 10/16/23 1651       Observation/Other Assessments   Observations ankles crossed, slumped posture      ROM / Strength   AROM / PROM / Strength Strength      AROM   Overall AROM Comments B sideflexion caused LBP,  50 cm digit III to floor Bilateral at the onset of pain      Strength   Overall Strength Comments RLE 3/5, L 4/5 LBP with L knee flexion      Palpation   Spinal mobility L shoulder , R iliac crest lowered,      Ambulation/Gait   Gait Comments minimal posterior rotation of L pelvis, R shoulder, 1.24 m/s          [facility] Adult PT Treatment/Exercise - 10/16/23 1651       Posture/Postural Control   Posture Comments rounded shoulders      Self-Care   Self-Care Other Self-Care Comments    Other Self-Care Comments  explained and showed anatomy/ physiology of deep core, and role of alignment and posture to minimzie LBP and prolapse, active listening, co-created goals ,      Therapeutic Activites    Therapeutic Activities Other Therapeutic Activities    Other Therapeutic Activities explained body mechanics for against gravity tasks in ADLs, work, and fitness will be covered in POC,      Neuro Re-ed    Neuro Re-ed Details  Cued for sit to stand, proper sitting posture and the rationale                HOME EXERCISE PROGRAM: See pt instruction section    ASSESSMENT:  CLINICAL IMPRESSION: Pt is a  33 yo  who presents with  following issues which impact QOL, ADL,  fitness, social and community activities:    1) rectocele  2) LBP   Pt's musculoskeletal assessment revealed uneven pelvic girdle and shoulder height, asymmetries to gait pattern,  limited spinal /pelvic mobility, dyscoordination and strength of pelvic floor mm, hip weakness, poor body mechanics which places strain on the abdominal/pelvic floor mm. These are deficits that indicate an ineffective intraabdominal pressure system associated with increased  risk for pt's Sx.     Pt will benefit from propioception/ coordination/ body mechanics training and education with gravity-loaded tasks at work and home and  fitness modifications in order to gain a more effective intraabdominal pressure system to minimize Sx. Advised pt to not perform sit-ups and crunches as these movement patterns lead to more downward forces on the pelvic floor, negatively impacting abdominopelvic/spinal dysfunctions.   Pt was provided education on etiology of Sx with anatomy, physiology explanation with images along with the benefits of customized pelvic PT Tx based on pt's medical conditions and musculoskeletal deficits.  Explained the physiology of deep core mm coordination and roles of pelvic floor function in urination, defecation, sexual function, and postural control with deep core mm system, and the role of posture and alignment to help pelvic issues.   Regional interdependent approaches will yield greater benefits in pt's POC.  Following Tx today which pt tolerated without complaints,  pt demo'd proper body mechanics to minimize straining pelvic floor.  Plan to address realignment of spine/ pelvis at next session to help promote optimize IAP system for improved pelvic floor function, trunk stability, gait, balance, stabilization with mobility tasks.  Plan to address pelvic floor issues once pelvis and spine are realigned to yield better outcomes.                                                     Pt benefits from skilled PT.    OBJECTIVE IMPAIRMENTS decreased activity tolerance, decreased coordination, decreased endurance, decreased mobility, difficulty walking, decreased ROM, decreased strength, decreased safety awareness, hypomobility, increased muscle spasms, impaired flexibility, improper body mechanics, postural dysfunction, and pain.    ACTIVITY LIMITATIONS  self-care,   work tasks as a Engineer, civil (consulting)    PARTICIPATION LIMITATIONS:  community, gym activities ( used to do  HIIT classes, [employer])    PERSONAL FACTORS   affecting patient's functional outcome:    REHAB POTENTIAL: Good   CLINICAL DECISION MAKING: Evolving/moderate complexity   EVALUATION COMPLEXITY: Moderate    PATIENT EDUCATION:    Education details: Showed pt anatomy images. Explained muscles attachments/ connection, physiology of deep core system/ spinal- thoracic-pelvis-lower kinetic chain as they relate to pt's presentation, Sx, and past Hx. Explained what and how these areas of deficits need to be restored to balance and function    See Therapeutic activity / neuromuscular re-education section  Answered pt's questions.   Person educated: Patient Education method: Explanation, Demonstration, Tactile cues, Verbal cues, and Handouts Education comprehension: verbalized understanding, returned demonstration, verbal cues required, tactile cues required, and needs further education     PLAN: PT FREQUENCY: 1x/week   PT DURATION: 10 weeks   PLANNED INTERVENTIONS:   Gait training;Stair training;Functional mobility training;DME Instruction;Therapeutic activities;Therapeutic exercise;Balance training;Neuromuscular re-education;Patient/family education;Vestibular;Visual/perceptual remediation/compensation;Passive range of motion;Moist Heat;Cryotherapy;Traction;Canalith Repostioning;Joint Manipulations;Manual lymph drainage;Manual techniques;Scar mobilization;Energy conservation;Dry needling;ADLs/Self Care Home Management;Biofeedback;Electrical Stimulation;Taping    PLAN FOR NEXT SESSION: See clinical impression for plan     GOALS: Goals reviewed with patient? Yes  SHORT TERM GOALS: Target date: 11/13/2023    Pt will demo IND with HEP  Baseline: Not IND            Goal status: INITIAL   LONG TERM GOALS: Target date: 12/25/2023    1.Pt will demo proper deep core coordination without chest breathing and optimal excursion of diaphragm/pelvic floor in order to  promote spinal stability and pelvic floor function  Baseline: dyscoordination Goal status: INITIAL  2.  Pt will demo proper body mechanics in against gravity tasks and ADLs  work tasks, fitness  to minimize straining pelvic floor / back    Baseline: not IND, improper form that places strain on pelvic floor  Goal status: INITIAL    3. Pt will demo increased gait speed > 1.3 m/s with reciprocal gait pattern, longer stride length  in order to ambulate safely in community and return to fitness routine  Baseline: minimal posterior rotation of L pelvis, R shoulder, 1.24 m/s  Goal status: INITIAL    4. Pt will demo levelled pelvic girdle and shoulder height in order to progress to deep core strengthening HEP and restore mobility at spine, pelvis, gait, posture minimize falls, and improve balance  Baseline: L shoulder , R iliac crest lowered, Goal status: INITIAL   5. Pt will improve PFDI-7 questionnaire to  pts  score change  to demo improved QOL  Baseline:    ( greater pts indicate greater negative impact on QOL)     pts  ( total)    pts  ( UIQ-7 )    pts  ( CRAIQ-7 )    pts  ( POPIQ-7 )  Baseline: plan to administer next session Goal status: INITIAL  6. Pt will report LBP decreased < 1/10 and no radiating pain with lifting, bending at home, work  and demo  no LBP with B sideflexion in order to restore ADLs, work tasks  Baseline:  LBP occurred 5 years ago  before first pregnancy. Radiating pain down buttocks on R. Pt tried chiropractor Tx but it did not help. 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  Since this 2nd pregnancy, LBP increased from 3/10 to 5/10. Radiating pain to buttocks only occur with tweaking back, lifting , bending in the wrong way. Pt points from low rib to the iliac crest bilaterally dull ache.    B sideflexion caused LBP,  50 cm digit III to floor Bilateral at the onset of pain     Pia Lupe Plump, PT 10/16/2023, 4:56 PM 3

## 2023-10-16 NOTE — Patient Instructions (Signed)
 Avoid straining pelvic floor, abdominal muscles , spine  Use log rolling technique instead of getting out of bed with your neck or the sit-up   Log rolling into and out of bed  Log rolling into and out of bed If getting out of bed on R side, Bent knees, scoot hips/ shoulder to L  Raise R arm completely overhead, rolling onto armpit  Then lower bent knees to bed to get into complete side lying position  Then drop legs off bed, and push up onto R elbow/forearm, and use L hand to push onto the bed  __ Proper body mechanics with getting out of a chair to decrease strain  on back &pelvic floor   Avoid holding your breath when Getting out of the chair:  Scoot to front part of chair chair Heels behind knees, feet are hip width apart, nose over toes  Inhale like you are smelling roses Exhale to stand  ___  Sitting with feet on ground, four points of contact Catch yourself crossing ankles and thighs  __  Ergonomic station handout

## 2023-10-16 NOTE — Addendum Note (Signed)
 Addended by: PIA FISCAL on: 10/16/2023 06:13 PM   Modules accepted: Orders

## 2023-10-17 ENCOUNTER — Ambulatory Visit (INDEPENDENT_AMBULATORY_CARE_PROVIDER_SITE_OTHER): Admitting: Obstetrics and Gynecology

## 2023-10-17 ENCOUNTER — Encounter: Payer: Self-pay | Admitting: Obstetrics and Gynecology

## 2023-10-17 ENCOUNTER — Other Ambulatory Visit: Payer: Self-pay

## 2023-10-17 VITALS — BP 107/72 | HR 91 | Ht 62.8 in | Wt 182.8 lb

## 2023-10-17 DIAGNOSIS — N941 Unspecified dyspareunia: Secondary | ICD-10-CM | POA: Insufficient documentation

## 2023-10-17 DIAGNOSIS — N393 Stress incontinence (female) (male): Secondary | ICD-10-CM | POA: Diagnosis not present

## 2023-10-17 DIAGNOSIS — N816 Rectocele: Secondary | ICD-10-CM

## 2023-10-17 DIAGNOSIS — R350 Frequency of micturition: Secondary | ICD-10-CM

## 2023-10-17 LAB — POCT URINALYSIS DIP (CLINITEK)
Bilirubin, UA: NEGATIVE
Glucose, UA: NEGATIVE mg/dL
Ketones, POC UA: NEGATIVE mg/dL
Leukocytes, UA: NEGATIVE
Nitrite, UA: NEGATIVE
POC PROTEIN,UA: NEGATIVE
Spec Grav, UA: 1.025 (ref 1.010–1.025)
Urobilinogen, UA: 0.2 U/dL
pH, UA: 5.5 (ref 5.0–8.0)

## 2023-10-17 NOTE — Assessment & Plan Note (Signed)
 Stage 0 anterior, Stage II posterior, Stage 0 apical prolapse - For treatment of pelvic organ prolapse, we discussed options for management including expectant management, conservative management, and surgical management, such as Kegels, a pessary, pelvic floor physical therapy, and specific surgical procedures. - Since she is considering having more children, would recommend continuing pelvic PT and can also consider a pessary. Would not pursue surgery at this time.

## 2023-10-17 NOTE — Progress Notes (Signed)
 New Patient Evaluation and Consultation  Referring Provider: [name], FNP PCP: [name], FNP Date of Service: 10/17/2023  SUBJECTIVE Chief Complaint: New Patient (Initial Visit) Samantha Bailey is a 33 y.o. female is here for rectocele)  History of Present Illness: Samantha Bailey is a 33 y.o. White or Caucasian female seen in consultation at the request of NP Curtis Wellington for evaluation of prolapse.    Review of records significant for: Shoulder dystocia Jan 2025- 1st degree laceration, repaired with 3-0 vicryl. Baby has no problems.  Has appt with pelvic PT  Urinary Symptoms: Leaks urine with cough/ sneeze, laughing, during sex, and with urgency Leaks 2-3 time(s) per day. SUI > UUI Started before having children Pad use: none Patient is bothered by UI symptoms.  Day time voids 6.  Nocturia: 0-1 times per night to void. Voiding dysfunction:  does not empty bladder well.  Patient does not use a catheter to empty bladder.  When urinating, patient feels difficulty starting urine stream and the need to urinate multiple times in a row Drinks: water, caffeine when at work 3 days a week  UTIs: 0 UTI's in the last year.   Denies history of blood in urine and kidney or bladder stones   Pelvic Organ Prolapse Symptoms:                  Patient Admits to a feeling of a bulge the vaginal area. It has been present since Jan after her birth Patient Admits to seeing a bulge- comes and goes. Noticed it more with having bowel movements or on her feet more This bulge is bothersome. She started [facility] pelvic PT at [facility]  Bowel Symptom: Bowel movements: 1 time(s) per day Stool consistency: hard Straining: no Splinting: yes.  Incomplete evacuation: yes.  Patient Denies accidental bowel leakage / fecal incontinence Bowel regimen: diet   Sexual Function Sexually active: yes.  Sexual orientation: Straight Pain with sex: Yes, at the vaginal opening, was  not painful prior to delivery Not breastfeeding  Pelvic Pain Denies pelvic pain  Past Medical History:  Past Medical History:  Diagnosis Date   Allergic rhinitis    Chlamydia 2008   Infertility, female    Medical history non-contributory    Multiple food allergies    PCOS (polycystic ovarian syndrome)    Pregnancy induced hypertension      Past Surgical History:   Past Surgical History:  Procedure Laterality Date   IR ANGIOGRAM SELECTIVE EACH ADDITIONAL VESSEL  06/20/2019   IR ANGIOGRAM SELECTIVE EACH ADDITIONAL VESSEL  06/20/2019   IR ANGIOGRAM SELECTIVE EACH ADDITIONAL VESSEL  06/20/2019   IR ANGIOGRAM SELECTIVE EACH ADDITIONAL VESSEL  06/20/2019   IR ANGIOGRAM SELECTIVE EACH ADDITIONAL VESSEL  06/20/2019   IR ANGIOGRAM SELECTIVE EACH ADDITIONAL VESSEL  06/20/2019   IR ANGIOGRAM SELECTIVE EACH ADDITIONAL VESSEL  06/20/2019   IR ANGIOGRAM VISCERAL SELECTIVE  06/20/2019   IR EMBO ART  VEN HEMORR LYMPH EXTRAV  INC GUIDE ROADMAPPING  06/20/2019   IR US  GUIDE VASC ACCESS RIGHT  06/20/2019   WISDOM TOOTH EXTRACTION  2013   no problems with anesthesia     Past OB/GYN History: OB History  Gravida Para Term Preterm AB Living  3 2 2  0 1 2  SAB IAB Ectopic Multiple Live Births  1 0 0 0 2    # Outcome Date GA Lbr Len/2nd Weight Sex Type Anes PTL Lv  3 Term 04/24/23 [redacted]w[redacted]d / 00:16 10 lb  1.2 oz (4.57 kg) M Vag-Spont EPI  LIV  2 Term 09/19/21 [redacted]w[redacted]d 07:55 / 01:13 7 lb 4.4 oz (3.3 kg) M Vag-Spont EPI, Local  LIV  1 SAB             Patient's last menstrual period was 07/22/2023. Contraception: condoms. Any history of abnormal pap smears: no. She does want to have more children   Medications: Patient has a current medication list which includes the following prescription(s): azelastine , epinephrine , fluticasone , mometasone , wegovy , sertraline , vitamin d  (ergocalciferol ), and [DISCONTINUED] unisom  sleeptabs.   Allergies: Patient is allergic to shellfish allergy  and shellfish allergy .    Social History:  Social History   Tobacco Use   Smoking status: Never    Passive exposure: Never   Smokeless tobacco: Never  Vaping Use   Vaping status: Never Used  Substance Use Topics   Alcohol use: No   Drug use: Never    Relationship status: married Patient lives with husband and 2 sons.   Patient is employed as an Charity fundraiser at [employer]. Regular exercise: Yes: HIT classes 2-3 times a week History of abuse: No  Family History:   Family History  Problem Relation Age of Onset   Eczema Mother    Healthy Mother    Varicose Veins Mother    Alcoholism Mother    Obesity Mother    Angioedema Father    Allergic rhinitis Father    Hypertension Father    Food Allergy  Father    Healthy Maternal Grandmother    Healthy Maternal Grandfather    Healthy Paternal Grandmother    Healthy Paternal Grandfather    Breast cancer Neg Hx    Cancer - Colon Neg Hx    Cervical cancer Neg Hx    Colon cancer Neg Hx    Ovarian cancer Neg Hx    Asthma Neg Hx    Urticaria Neg Hx      Review of Systems: Review of Systems  Constitutional:  Negative for fever, malaise/fatigue and weight loss.  Respiratory:  Negative for cough, shortness of breath and wheezing.   Cardiovascular:  Negative for chest pain, palpitations and leg swelling.  Gastrointestinal:  Negative for abdominal pain and blood in stool.  Genitourinary:  Negative for dysuria.  Musculoskeletal:  Negative for myalgias.  Skin:  Negative for rash.  Neurological:  Negative for dizziness and headaches.  Endo/Heme/Allergies:  Does not bruise/bleed easily.  Psychiatric/Behavioral:  Negative for depression. The patient is not nervous/anxious.      OBJECTIVE Physical Exam: Vitals:   10/17/23 1022  BP: 107/72  Pulse: 91  Weight: 182 lb 12.8 oz (82.9 kg)  Height: 5' 2.8 (1.595 m)    Physical Exam Vitals reviewed. Exam conducted with a chaperone present.  Constitutional:      General: She is not in acute distress. Pulmonary:      Effort: Pulmonary effort is normal.  Abdominal:     General: There is no distension.     Palpations: Abdomen is soft.     Tenderness: There is no abdominal tenderness. There is no rebound.   Musculoskeletal:        General: No swelling. Normal range of motion.   Skin:    General: Skin is warm and dry.     Findings: No rash.   Neurological:     Mental Status: She is alert and oriented to person, place, and time.   Psychiatric:        Mood and Affect: Mood normal.  Behavior: Behavior normal.      GU / Detailed Urogynecologic Evaluation:  Pelvic Exam: Normal external female genitalia; Bartholin's and Skene's glands normal in appearance; urethral meatus normal in appearance, no urethral masses or discharge.   CST: negative  Speculum exam reveals normal vaginal mucosa without atrophy. Cervix normal appearance. Uterus normal single, nontender. Adnexa no mass, fullness, tenderness.    Pelvic floor strength II/V, puborectalis IV/V external anal sphincter IV/V  Pelvic floor musculature: Right levator mildly tender, Right obturator mildly tender, Left levator mildly  tender, Left obturator mildly tender  POP-Q:   POP-Q  -2.5                                            Aa   -2.5                                           Ba  -9.5                                              C   3.5                                            Gh  4                                            Pb  11                                            tvl   -1                                            Ap  -1                                            Bp  -10                                              D      Rectal Exam:  Normal sphincter tone, small distal rectocele, enterocoele not present, no rectal masses, no sign of dyssynergia when asking the patient to bear down.  Post-Void Residual (PVR) by Bladder Scan: In order to evaluate bladder emptying, we discussed obtaining a postvoid  residual and patient agreed to this procedure.  Procedure: The ultrasound unit was placed on the patient's abdomen in the suprapubic region after the patient had voided.  Post Void Residual - 10/17/23 1035       Post Void Residual   Post Void Residual 15 mL           Laboratory Results: Lab Results  Component Value Date   COLORU yellow 10/17/2023   CLARITYU clear 10/17/2023   GLUCOSEUR negative 10/17/2023   BILIRUBINUR negative 10/17/2023   SPECGRAV 1.025 10/17/2023   RBCUR trace-lysed (A) 10/17/2023   PHUR 5.5 10/17/2023   PROTEINUR NEGATIVE 04/17/2023   UROBILINOGEN 0.2 10/17/2023   LEUKOCYTESUR Negative 10/17/2023    Lab Results  Component Value Date   CREATININE 0.78 06/17/2023   CREATININE 0.52 04/17/2023   CREATININE 0.53 09/18/2021    Lab Results  Component Value Date   HGBA1C 5.4 06/17/2023    Lab Results  Component Value Date   HGB 13.6 06/17/2023     ASSESSMENT AND PLAN Ms. Ojala is a 33 y.o. with:  1. Prolapse of posterior vaginal wall   2. SUI (stress urinary incontinence, female)   3. Urinary frequency   4. Dyspareunia, female     Prolapse of posterior vaginal wall Assessment & Plan: Stage 0 anterior, Stage II posterior, Stage 0 apical prolapse - For treatment of pelvic organ prolapse, we discussed options for management including expectant management, conservative management, and surgical management, such as Kegels, a pessary, pelvic floor physical therapy, and specific surgical procedures. - Since she is considering having more children, would recommend continuing pelvic PT and can also consider a pessary. Would not pursue surgery at this time.    SUI (stress urinary incontinence, female) Assessment & Plan: - Not that bothersome to her at this time. We discussed that pelvic PT can help improve her symptoms.    Urinary frequency -     POCT URINALYSIS DIP (CLINITEK)  Dyspareunia, female Assessment & Plan: - May be due to high  tone pelvic floor muscles. Can improve with pelvic PT.  - Also discussed use of lubricant w/wo lidocaine  to help with pain at introitus.    Return as needed   Rosaline LOISE Caper, MD

## 2023-10-17 NOTE — Assessment & Plan Note (Signed)
-   Not that bothersome to her at this time. We discussed that pelvic PT can help improve her symptoms.

## 2023-10-17 NOTE — Assessment & Plan Note (Signed)
-   May be due to high tone pelvic floor muscles. Can improve with pelvic PT.  - Also discussed use of lubricant w/wo lidocaine  to help with pain at introitus.

## 2023-10-17 NOTE — Telephone Encounter (Signed)
 Spoke to pt via phone call regarding pt wanting Dr Dartha to take over prescription Wegovy  due to not wanting to go  see PCP every three to four weeks. Per Dr Dartha pt would still have to come in every three to four week due medication increase. Pt said that she would just stay with her PCP since she would still have to schedule an appt.

## 2023-10-17 NOTE — Patient Instructions (Signed)
You have a stage 2 (out of 4) prolapse.  We discussed the fact that it is not life threatening but there are several treatment options. For treatment of pelvic organ prolapse, we discussed options for management including expectant management, conservative management, and surgical management, such as Kegels, a pessary, pelvic floor physical therapy, and specific surgical procedures.

## 2023-10-22 ENCOUNTER — Other Ambulatory Visit: Payer: Self-pay

## 2023-10-22 ENCOUNTER — Encounter (INDEPENDENT_AMBULATORY_CARE_PROVIDER_SITE_OTHER): Payer: Self-pay | Admitting: Family Medicine

## 2023-10-22 ENCOUNTER — Ambulatory Visit (INDEPENDENT_AMBULATORY_CARE_PROVIDER_SITE_OTHER): Admitting: Family Medicine

## 2023-10-22 VITALS — BP 99/70 | HR 84 | Temp 98.2°F | Ht 63.0 in | Wt 178.0 lb

## 2023-10-22 DIAGNOSIS — E66811 Obesity, class 1: Secondary | ICD-10-CM | POA: Diagnosis not present

## 2023-10-22 DIAGNOSIS — Z6831 Body mass index (BMI) 31.0-31.9, adult: Secondary | ICD-10-CM

## 2023-10-22 DIAGNOSIS — E559 Vitamin D deficiency, unspecified: Secondary | ICD-10-CM

## 2023-10-22 DIAGNOSIS — Z6832 Body mass index (BMI) 32.0-32.9, adult: Secondary | ICD-10-CM

## 2023-10-22 MED ORDER — WEGOVY 1.7 MG/0.75ML ~~LOC~~ SOAJ
1.7000 mg | SUBCUTANEOUS | 0 refills | Status: DC
  Filled 2023-10-22: qty 3, 28d supply, fill #0

## 2023-10-22 NOTE — Progress Notes (Signed)
 SUBJECTIVE:  Chief Complaint: Obesity  Interim History: Patient has been much better getting food in after work.  She will either get something from the cafeteria or will drink a core power protein shake.  Aside from that she has stayed consistent with her food intake and options.  Exercise has been limited to only like twice a week for the last month. She is opting to do 3 days a week then 2 days instead of 3 then 4.  She likes the Category 2 and mentions that she doesn't really see a need to change.   Samantha Bailey is here to discuss her progress with her obesity treatment plan. She is on the Category 2 Plan and states she is following her eating plan approximately 70 % of the time. She states she is exercising 45 minutes 2 times per week.   OBJECTIVE: Visit Diagnoses: Problem List Items Addressed This Visit       Other   Class 1 obesity with serious comorbidity and body mass index (BMI) of 32.0 to 32.9 in adult - Primary   Relevant Medications   Semaglutide -Weight Management (WEGOVY ) 1.7 MG/0.75ML SOAJ   Vitamin D  deficiency   Other Visit Diagnoses       BMI 31.0-31.9,adult           No data recorded      10/22/2023    3:00 PM 10/17/2023   10:22 AM 09/18/2023    9:00 AM  Vitals with BMI  Height 5' 3 5' 2.8 5' 3  Weight 178 lbs 182 lbs 13 oz 179 lbs  BMI 31.54 32.59 31.72  Systolic 99 107 104  Diastolic 70 72 71  Pulse 84 91 86       ASSESSMENT AND PLAN: Assessment & Plan Class 1 obesity with serious comorbidity and body mass index (BMI) of 32.0 to 32.9 in adult, unspecified obesity type Anthropometric Measurements Height: 5' 3 (1.6 m) Weight: 178 lb (80.7 kg) BMI (Calculated): 31.54 Weight at Last Visit: 179 lb Weight Lost Since Last Visit: 1 Weight Gained Since Last Visit: 0 Starting Weight: 182 lb Total Weight Loss (lbs): 4 lb (1.814 kg) Body Composition  Body Fat %: 35.2 % Fat Mass (lbs): 62.8 lbs Muscle Mass (lbs): 109.8 lbs Total Body Water (lbs):  74.2 lbs Visceral Fat Rating : 6 Other Clinical Data Today's Visit #: 5 Starting Date: 06/24/23 Comments: Cat 2  BMI 31.0-31.9,adult  Vitamin D  deficiency Doing well on Vitamin D  supplementation.  No nausea, vomiting or muscle weakness reported.No change in current therapy.   Diet: Samantha Bailey is currently in the action stage of change. As such, her goal is to continue with weight loss efforts and has agreed to the Category 2 Plan.   Exercise:  For substantial health benefits, adults should do at least 150 minutes (2 hours and 30 minutes) a week of moderate-intensity, or 75 minutes (1 hour and 15 minutes) a week of vigorous-intensity aerobic physical activity, or an equivalent combination of moderate- and vigorous-intensity aerobic activity. Aerobic activity should be performed in episodes of at least 10 minutes, and preferably, it should be spread throughout the week.  Behavior Modification:  We discussed the following Behavioral Modification Strategies today: increasing lean protein intake, decreasing simple carbohydrates, increasing vegetables, meal planning and cooking strategies, keeping healthy foods in the home, and planning for success.   No follow-ups on file.   She was informed of the importance of frequent follow up visits to maximize her success with intensive lifestyle modifications  for her multiple health conditions.  Attestation Statements:   Reviewed by clinician on day of visit: allergies, medications, problem list, medical history, surgical history, family history, social history, and previous encounter notes.     Adelita Cho, MD

## 2023-10-23 ENCOUNTER — Ambulatory Visit: Attending: Obstetrics and Gynecology | Admitting: Physical Therapy

## 2023-10-23 DIAGNOSIS — R2689 Other abnormalities of gait and mobility: Secondary | ICD-10-CM | POA: Insufficient documentation

## 2023-10-23 DIAGNOSIS — R293 Abnormal posture: Secondary | ICD-10-CM | POA: Insufficient documentation

## 2023-10-23 DIAGNOSIS — M5459 Other low back pain: Secondary | ICD-10-CM | POA: Insufficient documentation

## 2023-10-23 DIAGNOSIS — M533 Sacrococcygeal disorders, not elsewhere classified: Secondary | ICD-10-CM | POA: Insufficient documentation

## 2023-10-29 ENCOUNTER — Ambulatory Visit: Admitting: Physical Therapy

## 2023-10-29 DIAGNOSIS — R2689 Other abnormalities of gait and mobility: Secondary | ICD-10-CM | POA: Diagnosis not present

## 2023-10-29 DIAGNOSIS — R293 Abnormal posture: Secondary | ICD-10-CM

## 2023-10-29 DIAGNOSIS — M533 Sacrococcygeal disorders, not elsewhere classified: Secondary | ICD-10-CM

## 2023-10-29 DIAGNOSIS — M5459 Other low back pain: Secondary | ICD-10-CM

## 2023-10-29 NOTE — Patient Instructions (Addendum)
.  Stretches  2 x a day on bed   Neck / shoulder stretches:    Lying on back - small sushi roll towel under neck  _ 6 directions   Chin up, down Rotation like changing lanes when driving Ear to shoulder like puppy dog   10 reps    _angel wings, lower elbows down , keep arms touching bed  10 reps    ____________  ZigZag stretch  Reclined twist for hips and side of the hips/ legs  Lay on your back, knees bend Scoot hips to the R , leave shoulders in place Wobble knees to the L side 45 deg and to midline  10 reps     ___   Standing   Perpendicular to wall:  Side stretch with side of fist, elbow down and back    __   Avoid straining pelvic floor, abdominal muscles , spine  Use log rolling technique instead of getting out of bed with your neck or the sit-up     Log rolling into and out of bed   Log rolling into and out of bed If getting out of bed on R side, Bent knees, scoot hips/ shoulder to L  Raise R arm completely overhead, rolling onto armpit  Then lower bent knees to bed to get into complete side lying position  Then drop legs off bed, and push up onto R elbow/forearm, and use L hand to push onto the bed    Dig elbows and feet to lift hte buttocks and scoot without lifting head  __  Lifting car seat and diaper changing with transition on couch or thigh  Close to you, elbow by side  __      Transition from standing to floor :  stand to floor transfer :      _ slow     _ mini squat      _ crawl down with one hand on thigh      _downward dog  - >  shoulders down and back-  walk the dog ( knee bents to lengthe hamstrings)      Floor to stand :   downward dog   Feet are wider than hips, crawl hands back, butt is back, knees behind toes -> squat  Hands at waist , elbows back, chest lifts    Transition from standing to floor with him  Wide squat in front of a CHAIR  Forearms onto the chair with him  Lower knees down into double  kneeling   floor   ___     half kneeling transitions to and from the floor   Start with ski track stance, step back with heel/ toes turned straight, back toes tucked under Push off with back gluts   front knee above ankle without moving as your back knee

## 2023-10-30 NOTE — Therapy (Addendum)
 OUTPATIENT PHYSICAL THERAPY TREATMENT    Patient Name: Samantha Bailey MRN: [id number] DOB:Nov 11, 1990, 33 y.o., female Today's Date: 10/30/2023   PT End of Session - 10/29/23 1634     Visit Number 2    Number of Visits 10    Date for PT Re-Evaluation 12/25/23    PT Start Time 1635    PT Stop Time 1715    PT Time Calculation (min) 40 min    Activity Tolerance Patient tolerated treatment well;No increased pain    Behavior During Therapy WFL for tasks assessed/performed          Past Medical History:  Diagnosis Date   Allergic rhinitis    Chlamydia 2008   Infertility, female    Medical history non-contributory    Multiple food allergies    PCOS (polycystic ovarian syndrome)    Pregnancy induced hypertension    Past Surgical History:  Procedure Laterality Date   IR ANGIOGRAM SELECTIVE EACH ADDITIONAL VESSEL  06/20/2019   IR ANGIOGRAM SELECTIVE EACH ADDITIONAL VESSEL  06/20/2019   IR ANGIOGRAM SELECTIVE EACH ADDITIONAL VESSEL  06/20/2019   IR ANGIOGRAM SELECTIVE EACH ADDITIONAL VESSEL  06/20/2019   IR ANGIOGRAM SELECTIVE EACH ADDITIONAL VESSEL  06/20/2019   IR ANGIOGRAM SELECTIVE EACH ADDITIONAL VESSEL  06/20/2019   IR ANGIOGRAM SELECTIVE EACH ADDITIONAL VESSEL  06/20/2019   IR ANGIOGRAM VISCERAL SELECTIVE  06/20/2019   IR EMBO ART  VEN HEMORR LYMPH EXTRAV  INC GUIDE ROADMAPPING  06/20/2019   IR US  GUIDE VASC ACCESS RIGHT  06/20/2019   WISDOM TOOTH EXTRACTION  2013   no problems with anesthesia   Patient Active Problem List   Diagnosis Date Noted   SUI (stress urinary incontinence, female) 10/17/2023   Dyspareunia, female 10/17/2023   Vitamin D  deficiency 07/15/2023   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   Class 1 obesity with serious comorbidity and body mass index (BMI) of 32.0 to 32.9 in adult 06/17/2023   Shellfish allergy  06/17/2023   PCOS (polycystic ovarian syndrome) 06/17/2023   Numerous moles 06/17/2023   History of infertility 06/17/2023   Prolapse of  posterior vaginal wall 06/17/2023   Pregnancy 04/24/2023   Gestational hypertension 09/18/2021   Liver laceration, closed 06/20/2019   Insomnia 02/03/2017   Acne rosacea, papular type 10/12/2015   Allergic rhinitis 10/12/2015    PCP: Wellington Beams FNP   REFERRING PROVIDER: Marne Marseille MD   REFERRING DIAG: rectocele   Rationale for Evaluation and Treatment Rehabilitation  THERAPY DIAG:  Sacrococcygeal disorders, not elsewhere classified  Other abnormalities of gait and mobility  Abnormal posture  Other low back pain  Other low back pain   ONSET DATE: 6 months ago after  delivering 2 child   SUBJECTIVE:  SUBJECTIVE STATEMENT: 1) rectocele started 6 months ago after delivering 2nd child.  Excessive pushing was needed for her 10 lb baby . Son had shoulder dystocia. Pt felt a bulge out of her vagina. It is not hanging out but it is visible. When she inserts a tampon, she can feel the rectal wall and it makes her feel she has to poop. BMs occur daily without straining. With urination, pt has to go again with the feeling of not finishing. No straining with urination.  During sexual intercourse, the prolapse causes discomfort and painful in certain positions some of the time.    2) LBP occurred 5 years ago. Persisted during 1st pregnancy 3 years ago but not worsen due pregnancy. Radiating pain down buttocks on R. Pt tried chiropractor Tx but it did not help. 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  Since this 2nd pregnancy, LBP increased from 3/10 to 5/10. Radiating pain to buttocks only occur with tweaking back, lifting , bending in the wrong way.  Pain occurs with lifting children ( 6 months old , 22 lbs, 2 years ago - 30 lbs)   Pt  points from low rib to the iliac crest bilaterally dull ache.    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     PERTINENT HISTORY:    PAIN:  Are you having pain? Yes: see above  PRECAUTIONS: No  WEIGHT BEARING RESTRICTIONS: No  FALLS:  Has patient fallen in last 6 months? No  LIVING ENVIRONMENT: Lives with: family   Lives in: two story  Stairs: STE 2 with rail    OCCUPATION: RN   PLOF: IND   PATIENT GOALS:  LBP better managed and not worsen prolapse    OBJECTIVE:   [facility] PT Assessment - 10/16/23 1651       Observation/Other Assessments   Observations ankles crossed, slumped posture      ROM / Strength   AROM / PROM / Strength Strength      AROM   Overall AROM Comments B sideflexion caused LBP,  50 cm digit III to floor Bilateral at the onset of pain      Strength   Overall Strength Comments RLE 3/5, L 4/5 LBP with L knee flexion      Palpation   Spinal mobility L shoulder , R iliac crest lowered,      Ambulation/Gait   Gait Comments minimal posterior rotation of L pelvis, R shoulder, 1.24 m/s          [facility] Adult PT Treatment/Exercise - 10/30/23 1749       Self-Care   Other Self-Care Comments  explained about load transfer with carrying baby to minimize worsening of prolapse      Therapeutic Activites    Other Therapeutic Activities cued for proper squat, semi tandem lunge squat to lift baby with carseat, lifting baby from floor, cued for technique for stand to floor carrying baby,      Neuro Re-ed    Neuro Re-ed Details  cued for new stretches to minimize rounded shoulders , optimize scapulothoracic system with repeated lifting of 22 lb baby in ADLs, and with patent handling as RN in acute setting      Manual Therapy   Manual therapy comments STM/MWM distraction at problem areas noted in assessment to promote less rounded shoulders, optimal deep core funciton  HOME EXERCISE PROGRAM: See pt instruction  section    ASSESSMENT:  CLINICAL IMPRESSION:   Focused on manual Tx to minimizing rounded shoulders, hypomobility of C/T junction which will further minimize straining pelvic floor/ worsening prolapse.   Provided excessive cues for body mechanics with lifting baby and car seat, carrying baby from stand <> floor with optimal deep core and LKC coactivation. Pt demo'd correctly post Tx  Plan to reassess  alignment of spine/ pelvis at next session and progress to cervical/scapular strengthening at next session  to help promote optimize IAP system for improved pelvic floor function, trunk stability, gait, balance, stabilization with mobility tasks.  Plan to address pelvic floor issues once pelvis and spine are realigned to yield better outcomes.       Regional interdependent approaches will yield greater benefits in pt's POC.                                                 Pt benefits from skilled PT.   OBJECTIVE IMPAIRMENTS decreased activity tolerance, decreased coordination, decreased endurance, decreased mobility, difficulty walking, decreased ROM, decreased strength, decreased safety awareness, hypomobility, increased muscle spasms, impaired flexibility, improper body mechanics, postural dysfunction, and pain.    ACTIVITY LIMITATIONS  self-care,   work tasks as a Engineer, civil (consulting)    PARTICIPATION LIMITATIONS:  community, gym activities ( used to do HIIT classes, [employer])    PERSONAL FACTORS   affecting patient's functional outcome:    REHAB POTENTIAL: Good   CLINICAL DECISION MAKING: Evolving/moderate complexity   EVALUATION COMPLEXITY: Moderate    PATIENT EDUCATION:    Education details: Showed pt anatomy images. Explained muscles attachments/ connection, physiology of deep core system/ spinal- thoracic-pelvis-lower kinetic chain as they relate to pt's presentation, Sx, and past Hx. Explained what and how these areas of deficits need to be restored to balance and function     See Therapeutic activity / neuromuscular re-education section  Answered pt's questions.   Person educated: Patient Education method: Explanation, Demonstration, Tactile cues, Verbal cues, and Handouts Education comprehension: verbalized understanding, returned demonstration, verbal cues required, tactile cues required, and needs further education     PLAN: PT FREQUENCY: 1x/week   PT DURATION: 10 weeks   PLANNED INTERVENTIONS:   Gait training;Stair training;Functional mobility training;DME Instruction;Therapeutic activities;Therapeutic exercise;Balance training;Neuromuscular re-education;Patient/family education;Vestibular;Visual/perceptual remediation/compensation;Passive range of motion;Moist Heat;Cryotherapy;Traction;Canalith Repostioning;Joint Manipulations;Manual lymph drainage;Manual techniques;Scar mobilization;Energy conservation;Dry needling;ADLs/Self Care Home Management;Biofeedback;Electrical Stimulation;Taping    PLAN FOR NEXT SESSION: See clinical impression for plan     GOALS: Goals reviewed with patient? Yes  SHORT TERM GOALS: Target date: 11/13/2023    Pt will demo IND with HEP                    Baseline: Not IND            Goal status: INITIAL   LONG TERM GOALS: Target date: 12/25/2023    1.Pt will demo proper deep core coordination without chest breathing and optimal excursion of diaphragm/pelvic floor in order to promote spinal stability and pelvic floor function  Baseline: dyscoordination Goal status: INITIAL  2.  Pt will demo proper body mechanics in against gravity tasks and ADLs  work tasks, fitness  to minimize straining pelvic floor / back    Baseline: not IND, improper form that places strain on pelvic floor  Goal status:  INITIAL    3. Pt will demo increased gait speed > 1.3 m/s with reciprocal gait pattern, longer stride length  in order to ambulate safely in community and return to fitness routine  Baseline: minimal posterior rotation of L  pelvis, R shoulder, 1.24 m/s  Goal status: INITIAL    4. Pt will demo levelled pelvic girdle and shoulder height in order to progress to deep core strengthening HEP and restore mobility at spine, pelvis, gait, posture minimize falls, and improve balance  Baseline: L shoulder , R iliac crest lowered, Goal status: INITIAL   5. Pt will improve PFDI-7 questionnaire to  pts  score change  to demo improved QOL  Baseline:    ( greater pts indicate greater negative impact on QOL)     pts  ( total)    pts  ( UIQ-7 )    pts  ( CRAIQ-7 )    pts  ( POPIQ-7 )  Baseline: plan to administer next session Goal status: INITIAL  6. Pt will report LBP decreased < 1/10 and no radiating pain with lifting, bending at home, work  and demo  no LBP with B sideflexion in order to restore ADLs, work tasks  Baseline:  LBP occurred 5 years ago  before first pregnancy. Radiating pain down buttocks on R. Pt tried chiropractor Tx but it did not help. 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  Since this 2nd pregnancy, LBP increased from 3/10 to 5/10. Radiating pain to buttocks only occur with tweaking back, lifting , bending in the wrong way. Pt points from low rib to the iliac crest bilaterally dull ache.    B sideflexion caused LBP,  50 cm digit III to floor Bilateral at the onset of pain     Pia Lupe Plump, PT 10/30/2023, 5:57 PM 3

## 2023-10-31 DIAGNOSIS — H6063 Unspecified chronic otitis externa, bilateral: Secondary | ICD-10-CM | POA: Diagnosis not present

## 2023-10-31 DIAGNOSIS — H6123 Impacted cerumen, bilateral: Secondary | ICD-10-CM | POA: Diagnosis not present

## 2023-11-02 NOTE — Assessment & Plan Note (Signed)
 Anthropometric Measurements Height: 5' 3 (1.6 m) Weight: 178 lb (80.7 kg) BMI (Calculated): 31.54 Weight at Last Visit: 179 lb Weight Lost Since Last Visit: 1 Weight Gained Since Last Visit: 0 Starting Weight: 182 lb Total Weight Loss (lbs): 4 lb (1.814 kg) Body Composition  Body Fat %: 35.2 % Fat Mass (lbs): 62.8 lbs Muscle Mass (lbs): 109.8 lbs Total Body Water (lbs): 74.2 lbs Visceral Fat Rating : 6 Other Clinical Data Today's Visit #: 5 Starting Date: 06/24/23 Comments: Cat 2

## 2023-11-02 NOTE — Assessment & Plan Note (Signed)
 Doing well on Vitamin D  supplementation.  No nausea, vomiting or muscle weakness reported.No change in current therapy.

## 2023-11-06 ENCOUNTER — Ambulatory Visit: Admitting: Physical Therapy

## 2023-11-12 ENCOUNTER — Encounter (INDEPENDENT_AMBULATORY_CARE_PROVIDER_SITE_OTHER): Payer: Self-pay | Admitting: Family Medicine

## 2023-11-12 MED FILL — Sertraline HCl Tab 50 MG: ORAL | 30 days supply | Qty: 30 | Fill #1 | Status: AC

## 2023-11-13 ENCOUNTER — Ambulatory Visit: Admitting: Physical Therapy

## 2023-11-20 ENCOUNTER — Ambulatory Visit: Admitting: Physical Therapy

## 2023-11-20 DIAGNOSIS — R2689 Other abnormalities of gait and mobility: Secondary | ICD-10-CM | POA: Diagnosis not present

## 2023-11-20 DIAGNOSIS — M533 Sacrococcygeal disorders, not elsewhere classified: Secondary | ICD-10-CM | POA: Diagnosis not present

## 2023-11-20 DIAGNOSIS — M5459 Other low back pain: Secondary | ICD-10-CM | POA: Diagnosis not present

## 2023-11-20 DIAGNOSIS — R293 Abnormal posture: Secondary | ICD-10-CM | POA: Diagnosis not present

## 2023-11-20 NOTE — Therapy (Addendum)
 OUTPATIENT PHYSICAL THERAPY TREATMENT    Patient Name: Samantha Bailey MRN: [id number] DOB:Feb 20, 1991, 33 y.o., female Today's Date: 11/20/2023   PT End of Session - 11/20/23 0300     Visit Number 3    Number of Visits 10    Date for PT Re-Evaluation 12/25/23    PT Start Time 1635    PT Stop Time 1715    PT Time Calculation (min) 40 min    Activity Tolerance Patient tolerated treatment well;No increased pain    Behavior During Therapy WFL for tasks assessed/performed          Past Medical History:  Diagnosis Date   Allergic rhinitis    Chlamydia 2008   Infertility, female    Medical history non-contributory    Multiple food allergies    PCOS (polycystic ovarian syndrome)    Pregnancy induced hypertension    Past Surgical History:  Procedure Laterality Date   IR ANGIOGRAM SELECTIVE EACH ADDITIONAL VESSEL  06/20/2019   IR ANGIOGRAM SELECTIVE EACH ADDITIONAL VESSEL  06/20/2019   IR ANGIOGRAM SELECTIVE EACH ADDITIONAL VESSEL  06/20/2019   IR ANGIOGRAM SELECTIVE EACH ADDITIONAL VESSEL  06/20/2019   IR ANGIOGRAM SELECTIVE EACH ADDITIONAL VESSEL  06/20/2019   IR ANGIOGRAM SELECTIVE EACH ADDITIONAL VESSEL  06/20/2019   IR ANGIOGRAM SELECTIVE EACH ADDITIONAL VESSEL  06/20/2019   IR ANGIOGRAM VISCERAL SELECTIVE  06/20/2019   IR EMBO ART  VEN HEMORR LYMPH EXTRAV  INC GUIDE ROADMAPPING  06/20/2019   IR US  GUIDE VASC ACCESS RIGHT  06/20/2019   WISDOM TOOTH EXTRACTION  2013   no problems with anesthesia   Patient Active Problem List   Diagnosis Date Noted   SUI (stress urinary incontinence, female) 10/17/2023   Dyspareunia, female 10/17/2023   Vitamin D  deficiency 07/15/2023   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   Class 1 obesity with serious comorbidity and body mass index (BMI) of 32.0 to 32.9 in adult 06/17/2023   Shellfish allergy  06/17/2023   PCOS (polycystic ovarian syndrome) 06/17/2023   Numerous moles 06/17/2023   History of infertility 06/17/2023   Prolapse of  posterior vaginal wall 06/17/2023   Pregnancy 04/24/2023   Gestational hypertension 09/18/2021   Liver laceration, closed 06/20/2019   Insomnia 02/03/2017   Acne rosacea, papular type 10/12/2015   Allergic rhinitis 10/12/2015    PCP: Wellington Beams FNP   REFERRING PROVIDER: Marne Marseille MD   REFERRING DIAG: rectocele   Rationale for Evaluation and Treatment Rehabilitation  THERAPY DIAG:  Sacrococcygeal disorders, not elsewhere classified  Other abnormalities of gait and mobility  Abnormal posture  Other low back pain  Other low back pain   ONSET DATE: 6 months ago after  delivering 2 child   SUBJECTIVE:                                               SUBJECTIVE STATEMENT TODAY:  Pt notices 20-25% improvement with LBP   Radiating LBP occurs often when lifting patients  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  Pt does ask for techs for assistance. Pt tends to scoot pt in bed often but she is using chuck pads  SUBJECTIVE STATEMENT ON EVAL 10/16/23 : 1) rectocele started 6 months ago after delivering 2nd child.  Excessive pushing was needed for her 10 lb baby . Son had shoulder dystocia. Pt felt a bulge out of her vagina. It is not hanging out but it is visible. When she inserts a tampon, she can feel the rectal wall and it makes her feel she has to poop. BMs occur daily without straining. With urination, pt has to go again with the feeling of not finishing. No straining with urination.  During sexual intercourse, the prolapse causes discomfort and painful in certain positions some of the time.    2) LBP occurred 5 years ago. Persisted during 1st pregnancy 3 years ago but not worsen due pregnancy. Radiating pain down buttocks on R. Pt tried chiropractor Tx but it did not help. 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 It occurred often during HIIT classes.  Since this 2nd pregnancy, LBP increased from 3/10 to 5/10. Radiating pain to buttocks only occur with tweaking back, lifting , bending in the wrong way.  Pain occurs with lifting children ( 6 months old , 22 lbs, 2 years ago - 30 lbs)   Pt points from low rib to the iliac crest bilaterally dull ache.    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     PERTINENT HISTORY:    PAIN:  Are you having pain? Yes: see above  PRECAUTIONS: No  WEIGHT BEARING RESTRICTIONS: No  FALLS:  Has patient fallen in last 6 months? No  LIVING ENVIRONMENT: Lives with: family   Lives in: two story  Stairs: STE 2 with rail    OCCUPATION: RN   PLOF: IND   PATIENT GOALS:  LBP better managed and not worsen prolapse    OBJECTIVE:     [facility] PT Assessment - 11/24/23 0916       Palpation   Spinal mobility levelled pelvis and shoulders, less rounded shoulders    Palpation comment tightness along LKC, feet hypomobile midfoot, limited toe abduction, ( Hx of wearing flip flops) , IR of knee          [facility] Adult PT Treatment/Exercise - 11/24/23 0921       Self-Care   Other Self-Care Comments  discussed replacing flip flops with sandals with ankle strap to minimize hypomobile midfoot joints for more pelvic floor upper chain function and less adducted knees      Neuro Re-ed    Neuro Re-ed Details  cued for ER of knee, more feet mobility      Manual Therapy   Manual therapy comments STM/MWM at [facility] to promote ER of knee, DF?EV, midfeet mobility , SIJ           HOME EXERCISE PROGRAM: See pt instruction section    ASSESSMENT:  CLINICAL IMPRESSION:   Improvements include:  alignment of spine/ pelvis restored to levelled alignment  less  rounded shoulders, hypomobility of C/T junction which will further minimize straining pelvic floor/ worsening prolapse.   Focused on minimizing knee adduction, hip IR,  hypomobile feet to promote better alignment ad pelvic floor function.  Manual Tx was applied to minimize  tightness along LKC, feet hypomobile midfoot, limited toe abduction, ( Hx of wearing flip flops) , IR of knee.  Discussed replacing flip flops with sandals with ankle strap to minimize hypomobile midfoot joints for more pelvic floor upper chain function and less adducted  knees. Plan to add more LKC HEP next session   Plan to add cervical/scapular strengthening at next session  to help promote optimize IAP system for improved pelvic floor function, trunk stability, gait, balance, stabilization with mobility tasks.     Regional interdependent approaches will yield greater benefits in pt's POC                                                 Pt benefits from skilled PT.   OBJECTIVE IMPAIRMENTS decreased activity tolerance, decreased coordination, decreased endurance, decreased mobility, difficulty walking, decreased ROM, decreased strength, decreased safety awareness, hypomobility, increased muscle spasms, impaired flexibility, improper body mechanics, postural dysfunction, and pain.    ACTIVITY LIMITATIONS  self-care,   work tasks as a Engineer, civil (consulting)    PARTICIPATION LIMITATIONS:  community, gym activities ( used to do HIIT classes, [employer])    PERSONAL FACTORS   affecting patient's functional outcome:    REHAB POTENTIAL: Good   CLINICAL DECISION MAKING: Evolving/moderate complexity   EVALUATION COMPLEXITY: Moderate    PATIENT EDUCATION:    Education details: Showed pt anatomy images. Explained muscles attachments/ connection, physiology of deep core system/ spinal- thoracic-pelvis-lower kinetic chain as they relate to pt's presentation, Sx, and past Hx. Explained what and how these areas of deficits need to be restored to balance and function    See Therapeutic activity / neuromuscular re-education section  Answered pt's questions.   Person educated: Patient Education method:  Explanation, Demonstration, Tactile cues, Verbal cues, and Handouts Education comprehension: verbalized understanding, returned demonstration, verbal cues required, tactile cues required, and needs further education     PLAN: PT FREQUENCY: 1x/week   PT DURATION: 10 weeks   PLANNED INTERVENTIONS:   Gait training;Stair training;Functional mobility training;DME Instruction;Therapeutic activities;Therapeutic exercise;Balance training;Neuromuscular re-education;Patient/family education;Vestibular;Visual/perceptual remediation/compensation;Passive range of motion;Moist Heat;Cryotherapy;Traction;Canalith Repostioning;Joint Manipulations;Manual lymph drainage;Manual techniques;Scar mobilization;Energy conservation;Dry needling;ADLs/Self Care Home Management;Biofeedback;Electrical Stimulation;Taping    PLAN FOR NEXT SESSION: See clinical impression for plan     GOALS: Goals reviewed with patient? Yes  SHORT TERM GOALS: Target date: 11/13/2023    Pt will demo IND with HEP                    Baseline: Not IND            Goal status: INITIAL   LONG TERM GOALS: Target date: 12/25/2023    1.Pt will demo proper deep core coordination without chest breathing and optimal excursion of diaphragm/pelvic floor in order to promote spinal stability and pelvic floor function  Baseline: dyscoordination Goal status: INITIAL  2.  Pt will demo proper body mechanics in against gravity tasks and ADLs  work tasks, fitness  to minimize straining pelvic floor / back    Baseline: not IND, improper form that places strain on pelvic floor  Goal status: INITIAL    3. Pt will demo increased gait speed > 1.3 m/s with reciprocal gait pattern, longer stride length  in order to ambulate safely in community and return to fitness routine  Baseline: minimal posterior rotation of L pelvis, R shoulder, 1.24 m/s  Goal status: INITIAL    4. Pt will demo levelled pelvic girdle and shoulder height in order to progress to  deep core strengthening HEP and restore mobility at spine, pelvis, gait, posture minimize falls, and improve balance  Baseline: L shoulder ,  R iliac crest lowered, Goal status: INITIAL   5. Pt will improve PFDI-7 questionnaire to  pts  score change  to demo improved QOL  Baseline:    ( greater pts indicate greater negative impact on QOL)     pts  ( total)    pts  ( UIQ-7 )    pts  ( CRAIQ-7 )    pts  ( POPIQ-7 )  Baseline: plan to administer next session Goal status: INITIAL  6. Pt will report LBP decreased < 1/10 and no radiating pain with lifting, bending at home, work  and demo  no LBP with B sideflexion in order to restore ADLs, work tasks  Baseline:  LBP occurred 5 years ago  before first pregnancy. Radiating pain down buttocks on R. Pt tried chiropractor Tx but it did not help. 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  Since this 2nd pregnancy, LBP increased from 3/10 to 5/10. Radiating pain to buttocks only occur with tweaking back, lifting , bending in the wrong way. Pt points from low rib to the iliac crest bilaterally dull ache.    B sideflexion caused LBP,  50 cm digit III to floor Bilateral at the onset of pain     Pia Lupe Plump, PT 11/20/2023, 4:41 PM 3

## 2023-11-21 ENCOUNTER — Other Ambulatory Visit: Payer: Self-pay

## 2023-11-24 ENCOUNTER — Other Ambulatory Visit (HOSPITAL_COMMUNITY): Payer: Self-pay

## 2023-11-24 ENCOUNTER — Other Ambulatory Visit: Payer: Self-pay

## 2023-11-24 ENCOUNTER — Ambulatory Visit (INDEPENDENT_AMBULATORY_CARE_PROVIDER_SITE_OTHER): Admitting: Family Medicine

## 2023-11-24 ENCOUNTER — Encounter (INDEPENDENT_AMBULATORY_CARE_PROVIDER_SITE_OTHER): Payer: Self-pay | Admitting: Family Medicine

## 2023-11-24 VITALS — BP 104/59 | HR 85 | Temp 98.0°F | Ht 63.0 in | Wt 176.0 lb

## 2023-11-24 DIAGNOSIS — E559 Vitamin D deficiency, unspecified: Secondary | ICD-10-CM | POA: Diagnosis not present

## 2023-11-24 DIAGNOSIS — Z6831 Body mass index (BMI) 31.0-31.9, adult: Secondary | ICD-10-CM

## 2023-11-24 DIAGNOSIS — E66811 Obesity, class 1: Secondary | ICD-10-CM

## 2023-11-24 MED ORDER — WEGOVY 2.4 MG/0.75ML ~~LOC~~ SOAJ
2.4000 mg | SUBCUTANEOUS | 0 refills | Status: DC
  Filled 2023-11-24 (×2): qty 3, 28d supply, fill #0

## 2023-11-24 MED ORDER — VITAMIN D (ERGOCALCIFEROL) 1.25 MG (50000 UNIT) PO CAPS
50000.0000 [IU] | ORAL_CAPSULE | ORAL | 0 refills | Status: DC
  Filled 2023-11-24 (×2): qty 12, 84d supply, fill #0

## 2023-11-24 NOTE — Assessment & Plan Note (Signed)
 Patient needs refill of prescription strength vitamin D  at this time.  No nausea, vomiting, or muscle weakness reported.  Needs a repeat vitamin D  level at next appointment.

## 2023-11-24 NOTE — Progress Notes (Signed)
   SUBJECTIVE:  Chief Complaint: Obesity  Interim History: patient has been trying to stay consistent with her meals and is incorporating more core life protein shakes.  She has been trying to get creative with her intake and is doing youtube workouts 2 x a week.  She does think the heat has something to do with her ability to stay consistent exercise wise.  Nothing big going on this week.  She is planning a family vacation next month.  Samantha Bailey is here to discuss her progress with her obesity treatment plan. She is on the Category 2 Plan and states she is following her eating plan approximately 70 % of the time. She states she is exercising 45 minutes 2-3 times per week.   OBJECTIVE: Visit Diagnoses: Problem List Items Addressed This Visit       Other   Class 1 obesity with serious comorbidity and body mass index (BMI) of 32.0 to 32.9 in adult - Primary   Relevant Medications   Semaglutide -Weight Management (WEGOVY ) 2.4 MG/0.75ML SOAJ   Vitamin D  deficiency   Relevant Medications   Vitamin D , Ergocalciferol , (DRISDOL ) 1.25 MG (50000 UNIT) CAPS capsule   Other Visit Diagnoses       BMI 31.0-31.9,adult           Vitals Temp: 98 F (36.7 C) BP: (!) 104/59 Pulse Rate: 85 SpO2: 97 %   Anthropometric Measurements Height: 5' 3 (1.6 m) Weight: 176 lb (79.8 kg) BMI (Calculated): 31.18 Weight at Last Visit: 178 lb Weight Lost Since Last Visit: 2 Weight Gained Since Last Visit: 0 Starting Weight: 182 lb Total Weight Loss (lbs): 6 lb (2.722 kg)   Body Composition  Body Fat %: 36.8 % Fat Mass (lbs): 65 lbs Muscle Mass (lbs): 111.8 lbs Total Body Water (lbs): 72.8 lbs Visceral Fat Rating : 6   Other Clinical Data Today's Visit #: 6 Starting Date: 06/24/23 Comments: Cat 2     ASSESSMENT AND PLAN: Assessment & Plan Vitamin D  deficiency Patient needs refill of prescription strength vitamin D  at this time.  No nausea, vomiting, or muscle weakness reported.  Needs a  repeat vitamin D  level at next appointment. BMI 31.0-31.9,adult  Class 1 obesity with serious comorbidity and body mass index (BMI) of 32.0 to 32.9 in adult, unspecified obesity type    Diet: Samantha Bailey is currently in the action stage of change. As such, her goal is to continue with weight loss efforts and has agreed to the Category 2 Plan.   Exercise:  Adults should also include muscle-strengthening activities that involve all major muscle groups on 2 or more days a week.  Behavior Modification:  We discussed the following Behavioral Modification Strategies today: increasing lean protein intake, decreasing simple carbohydrates, increasing vegetables, meal planning and cooking strategies, keeping healthy foods in the home, and planning for success. We discussed various medication options to help Samantha Bailey with her weight loss efforts and we both agreed to increase Wegovy  to 2.4mg  weekly.  Patient is no longer feeling the satiety effects on 1.7 mg weekly  Return in about 4 weeks (around 12/22/2023).   She was informed of the importance of frequent follow up visits to maximize her success with intensive lifestyle modifications for her multiple health conditions.  Attestation Statements:   Reviewed by clinician on day of visit: allergies, medications, problem list, medical history, surgical history, family history, social history, and previous encounter notes.   Adelita Cho, MD

## 2023-11-27 ENCOUNTER — Ambulatory Visit: Admitting: Physical Therapy

## 2023-11-28 ENCOUNTER — Other Ambulatory Visit (HOSPITAL_COMMUNITY): Payer: Self-pay

## 2023-12-04 ENCOUNTER — Ambulatory Visit: Attending: Obstetrics and Gynecology | Admitting: Physical Therapy

## 2023-12-04 DIAGNOSIS — R2689 Other abnormalities of gait and mobility: Secondary | ICD-10-CM | POA: Insufficient documentation

## 2023-12-04 DIAGNOSIS — M5459 Other low back pain: Secondary | ICD-10-CM | POA: Diagnosis not present

## 2023-12-04 DIAGNOSIS — R293 Abnormal posture: Secondary | ICD-10-CM | POA: Insufficient documentation

## 2023-12-04 DIAGNOSIS — M533 Sacrococcygeal disorders, not elsewhere classified: Secondary | ICD-10-CM | POA: Insufficient documentation

## 2023-12-04 NOTE — Patient Instructions (Signed)
 Place band in U    band under ballmounds  while laying on back w/ knees bent   Lying on back, knees bent    band under ballmounds  while laying on back w/ knees bent  W exercise  10 reps x 2 sets   Band is placed under feet, knees bent, feet are hip width apart Hold band with thumbs point out, keep upper arm and elbow touching the bed the whole time  - inhale and then exhale pull bands by bending elbows hands move in a w  (feel shoulder blades squeezing)   ________________  Deep core level 1-2 ( handout)

## 2023-12-04 NOTE — Therapy (Signed)
 OUTPATIENT PHYSICAL THERAPY TREATMENT    Patient Name: Samantha Bailey MRN: [id number] DOB:April 09, 1991, 33 y.o., female Today's Date: 12/04/2023   PT End of Session - 12/04/23 1640     Visit Number 4    Number of Visits 10    Date for PT Re-Evaluation 12/25/23    PT Start Time 1630    PT Stop Time 1715    PT Time Calculation (min) 45 min    Activity Tolerance Patient tolerated treatment well;No increased pain    Behavior During Therapy WFL for tasks assessed/performed          Past Medical History:  Diagnosis Date   Allergic rhinitis    Chlamydia 2008   Infertility, female    Medical history non-contributory    Multiple food allergies    PCOS (polycystic ovarian syndrome)    Pregnancy induced hypertension    Past Surgical History:  Procedure Laterality Date   IR ANGIOGRAM SELECTIVE EACH ADDITIONAL VESSEL  06/20/2019   IR ANGIOGRAM SELECTIVE EACH ADDITIONAL VESSEL  06/20/2019   IR ANGIOGRAM SELECTIVE EACH ADDITIONAL VESSEL  06/20/2019   IR ANGIOGRAM SELECTIVE EACH ADDITIONAL VESSEL  06/20/2019   IR ANGIOGRAM SELECTIVE EACH ADDITIONAL VESSEL  06/20/2019   IR ANGIOGRAM SELECTIVE EACH ADDITIONAL VESSEL  06/20/2019   IR ANGIOGRAM SELECTIVE EACH ADDITIONAL VESSEL  06/20/2019   IR ANGIOGRAM VISCERAL SELECTIVE  06/20/2019   IR EMBO ART  VEN HEMORR LYMPH EXTRAV  INC GUIDE ROADMAPPING  06/20/2019   IR US  GUIDE VASC ACCESS RIGHT  06/20/2019   WISDOM TOOTH EXTRACTION  2013   no problems with anesthesia   Patient Active Problem List   Diagnosis Date Noted   SUI (stress urinary incontinence, female) 10/17/2023   Dyspareunia, female 10/17/2023   Vitamin D  deficiency 07/15/2023   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   Class 1 obesity with serious comorbidity and body mass index (BMI) of 32.0 to 32.9 in adult 06/17/2023   Shellfish allergy  06/17/2023   PCOS (polycystic ovarian syndrome) 06/17/2023   Numerous moles 06/17/2023   History of infertility 06/17/2023   Prolapse of  posterior vaginal wall 06/17/2023   Pregnancy 04/24/2023   Gestational hypertension 09/18/2021   Liver laceration, closed 06/20/2019   Insomnia 02/03/2017   Acne rosacea, papular type 10/12/2015   Allergic rhinitis 10/12/2015    PCP: Wellington Beams FNP   REFERRING PROVIDER: Marne Marseille MD   REFERRING DIAG: rectocele   Rationale for Evaluation and Treatment Rehabilitation  THERAPY DIAG:  Sacrococcygeal disorders, not elsewhere classified  Other abnormalities of gait and mobility  Abnormal posture  Other low back pain  Other low back pain   ONSET DATE: 6 months ago after  delivering 2 child   SUBJECTIVE:                                               SUBJECTIVE STATEMENT TODAY:  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 Pt likes her flip flops and would like to keep wearing them. She is mindful of spreading her toes  SUBJECTIVE STATEMENT ON EVAL 10/16/23 : 1) rectocele started 6 months ago after delivering 2nd child.  Excessive pushing was needed for her 10 lb baby . Son had shoulder dystocia. Pt felt a bulge out of her vagina. It is not hanging out but it is visible. When she inserts a tampon, she can feel the rectal wall and it makes her feel she has to poop. BMs occur daily without straining. With urination, pt has to go again with the feeling of not finishing. No straining with urination.  During sexual intercourse, the prolapse causes discomfort and painful in certain positions some of the time.    2) LBP occurred 5 years ago. Persisted during 1st pregnancy 3 years ago but not worsen due pregnancy. Radiating pain down buttocks on R. Pt tried chiropractor Tx but it did not help. 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  Since this 2nd pregnancy, LBP increased from 3/10 to 5/10. Radiating pain to buttocks only  occur with tweaking back, lifting , bending in the wrong way.  Pain occurs with lifting children ( 6 months old , 22 lbs, 2 years ago - 30 lbs)   Pt points from low rib to the iliac crest bilaterally dull ache.    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     PERTINENT HISTORY:    PAIN:  Are you having pain? Yes: see above  PRECAUTIONS: No  WEIGHT BEARING RESTRICTIONS: No  FALLS:  Has patient fallen in last 6 months? No  LIVING ENVIRONMENT: Lives with: family   Lives in: two story  Stairs: STE 2 with rail    OCCUPATION: RN   PLOF: IND   PATIENT GOALS:  LBP better managed and not worsen prolapse    OBJECTIVE:    [facility] PT Assessment - 12/04/23 1709       Posture/Postural Control   Posture Comments rounded shoulder present, adducted knees in hooklying      Palpation   Spinal mobility levelled pelvis and shoulders, less rounded shoulders          [facility] Adult PT Treatment/Exercise - 12/05/23 2141       Self-Care   Other Self-Care Comments  explained future sessions to assess pelvic floor with external and internal assessment / Tx and what that entails and pt can decide on  which type is more comoftable and provide consent for      Neuro Re-ed    Neuro Re-ed Details  excessive cues for cervicoascapular stabilization with resistance band exercise, modified to accommondate comfort when reported shoulder pain . Modifications did not cause shoulder pain , cued for propioception and technique with deep core level 1-2      Exercises   Exercises Other Exercises    Other Exercises  see pt instructions             HOME EXERCISE PROGRAM: See pt instruction section    ASSESSMENT:  CLINICAL IMPRESSION:   Improvements include:  alignment of spine/ pelvis restored to levelled alignment  less  rounded shoulders, hypomobility of C/T junction which will further minimize straining pelvic floor/ worsening prolapse.  Pt is practicing techniques with  use of LKC and less straining of pelvic floor with patient handling as a RN which will help minimize prolapse and straining to pelvic floor   Added cervical/scapular strengthening with cues  for proprioception and modifications with use of  green resistance bands.   Cued for propioception and technique with deep core level 1-2 training. Pt demo'd correctly post Tx.  Anticipate these  new HEP  will to help promote optimize IAP system for improved pelvic floor function, trunk stability, gait, balance, stabilization with mobility tasks.     Regional interdependent approaches will yield greater benefits in pt's POC                                                 Pt benefits from skilled PT.   OBJECTIVE IMPAIRMENTS decreased activity tolerance, decreased coordination, decreased endurance, decreased mobility, difficulty walking, decreased ROM, decreased strength, decreased safety awareness, hypomobility, increased muscle spasms, impaired flexibility, improper body mechanics, postural dysfunction, and pain.    ACTIVITY LIMITATIONS  self-care,   work tasks as a Engineer, civil (consulting)    PARTICIPATION LIMITATIONS:  community, gym activities ( used to do HIIT classes, [employer])    PERSONAL FACTORS   affecting patient's functional outcome:    REHAB POTENTIAL: Good   CLINICAL DECISION MAKING: Evolving/moderate complexity   EVALUATION COMPLEXITY: Moderate    PATIENT EDUCATION:    Education details: Showed pt anatomy images. Explained muscles attachments/ connection, physiology of deep core system/ spinal- thoracic-pelvis-lower kinetic chain as they relate to pt's presentation, Sx, and past Hx. Explained what and how these areas of deficits need to be restored to balance and function    See Therapeutic activity / neuromuscular re-education section  Answered pt's questions.   Person educated: Patient Education method: Explanation, Demonstration, Tactile cues, Verbal cues, and Handouts Education  comprehension: verbalized understanding, returned demonstration, verbal cues required, tactile cues required, and needs further education     PLAN: PT FREQUENCY: 1x/week   PT DURATION: 10 weeks   PLANNED INTERVENTIONS:   Gait training;Stair training;Functional mobility training;DME Instruction;Therapeutic activities;Therapeutic exercise;Balance training;Neuromuscular re-education;Patient/family education;Vestibular;Visual/perceptual remediation/compensation;Passive range of motion;Moist Heat;Cryotherapy;Traction;Canalith Repostioning;Joint Manipulations;Manual lymph drainage;Manual techniques;Scar mobilization;Energy conservation;Dry needling;ADLs/Self Care Home Management;Biofeedback;Electrical Stimulation;Taping    PLAN FOR NEXT SESSION: See clinical impression for plan     GOALS: Goals reviewed with patient? Yes  SHORT TERM GOALS: Target date: 11/13/2023    Pt will demo IND with HEP                    Baseline: Not IND            Goal status: INITIAL   LONG TERM GOALS: Target date: 12/25/2023    1.Pt will demo proper deep core coordination without chest breathing and optimal excursion of diaphragm/pelvic floor in order to promote spinal stability and pelvic floor function  Baseline: dyscoordination Goal status: INITIAL  2.  Pt will demo proper body mechanics in against gravity tasks and ADLs  work tasks, fitness  to minimize straining pelvic floor / back    Baseline: not IND, improper form that places strain on pelvic floor  Goal status: INITIAL    3. Pt will demo increased gait speed > 1.3 m/s with reciprocal gait pattern, longer stride length  in order to ambulate safely in community and return to fitness routine  Baseline: minimal posterior rotation of L pelvis, R shoulder, 1.24 m/s  Goal status: INITIAL    4. Pt will demo levelled pelvic girdle and shoulder height in order to progress to deep core strengthening HEP and restore mobility at spine, pelvis, gait,  posture  minimize falls, and improve balance  Baseline: L shoulder , R iliac crest lowered, Goal status: LE    5. Pt will improve PFDI-7 questionnaire to  pts  score change  to demo improved QOL  Baseline:    ( greater pts indicate greater negative impact on QOL)     pts  ( total)    pts  ( UIQ-7 )    pts  ( CRAIQ-7 )    pts  ( POPIQ-7 )  Baseline: plan to administer next session Goal status: INITIAL  6. Pt will report LBP decreased < 1/10 and no radiating pain with lifting, bending at home, work  and demo  no LBP with B sideflexion in order to restore ADLs, work tasks  Baseline:  LBP occurred 5 years ago  before first pregnancy. Radiating pain down buttocks on R. Pt tried chiropractor Tx but it did not help. 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  Since this 2nd pregnancy, LBP increased from 3/10 to 5/10. Radiating pain to buttocks only occur with tweaking back, lifting , bending in the wrong way. Pt points from low rib to the iliac crest bilaterally dull ache.    B sideflexion caused LBP,  50 cm digit III to floor Bilateral at the onset of pain     Pia Lupe Plump, PT 12/04/2023, 4:40 PM 3

## 2023-12-11 ENCOUNTER — Encounter: Admitting: Physical Therapy

## 2023-12-14 MED FILL — Sertraline HCl Tab 50 MG: ORAL | 30 days supply | Qty: 30 | Fill #2 | Status: AC

## 2023-12-15 ENCOUNTER — Other Ambulatory Visit (HOSPITAL_COMMUNITY): Payer: Self-pay

## 2023-12-18 ENCOUNTER — Ambulatory Visit: Admitting: Physical Therapy

## 2023-12-19 ENCOUNTER — Encounter: Admitting: Family Medicine

## 2023-12-21 ENCOUNTER — Other Ambulatory Visit (HOSPITAL_COMMUNITY): Payer: Self-pay

## 2023-12-25 ENCOUNTER — Other Ambulatory Visit: Payer: Self-pay

## 2023-12-25 ENCOUNTER — Ambulatory Visit: Attending: Obstetrics and Gynecology | Admitting: Physical Therapy

## 2023-12-25 ENCOUNTER — Ambulatory Visit (INDEPENDENT_AMBULATORY_CARE_PROVIDER_SITE_OTHER): Admitting: Family Medicine

## 2023-12-25 ENCOUNTER — Encounter (INDEPENDENT_AMBULATORY_CARE_PROVIDER_SITE_OTHER): Payer: Self-pay | Admitting: Family Medicine

## 2023-12-25 VITALS — BP 98/67 | HR 96 | Temp 98.0°F | Ht 63.0 in | Wt 176.0 lb

## 2023-12-25 DIAGNOSIS — E559 Vitamin D deficiency, unspecified: Secondary | ICD-10-CM

## 2023-12-25 DIAGNOSIS — M533 Sacrococcygeal disorders, not elsewhere classified: Secondary | ICD-10-CM | POA: Insufficient documentation

## 2023-12-25 DIAGNOSIS — R2689 Other abnormalities of gait and mobility: Secondary | ICD-10-CM | POA: Diagnosis not present

## 2023-12-25 DIAGNOSIS — M5459 Other low back pain: Secondary | ICD-10-CM | POA: Insufficient documentation

## 2023-12-25 DIAGNOSIS — R293 Abnormal posture: Secondary | ICD-10-CM | POA: Insufficient documentation

## 2023-12-25 DIAGNOSIS — E66811 Obesity, class 1: Secondary | ICD-10-CM | POA: Diagnosis not present

## 2023-12-25 DIAGNOSIS — Z6831 Body mass index (BMI) 31.0-31.9, adult: Secondary | ICD-10-CM | POA: Diagnosis not present

## 2023-12-25 MED ORDER — VITAMIN D (ERGOCALCIFEROL) 1.25 MG (50000 UNIT) PO CAPS
50000.0000 [IU] | ORAL_CAPSULE | ORAL | 0 refills | Status: AC
  Filled 2023-12-25 – 2024-02-12 (×2): qty 12, 84d supply, fill #0

## 2023-12-25 MED ORDER — WEGOVY 2.4 MG/0.75ML ~~LOC~~ SOAJ
2.4000 mg | SUBCUTANEOUS | 0 refills | Status: DC
  Filled 2023-12-25: qty 9, 84d supply, fill #0

## 2023-12-25 NOTE — Therapy (Signed)
 OUTPATIENT PHYSICAL THERAPY TREATMENT  / [facility]    Patient Name: Samantha Bailey MRN: [id number] DOB:Jan 20, 1991, 33 y.o., female Today's Date: 12/25/2023   PT End of Session - 12/25/23 0500     Visit Number 5    Number of Visits 20    Date for PT Re-Evaluation 03/04/24    PT Start Time 1637    PT Stop Time 1725    PT Time Calculation (min) 48 min    Activity Tolerance Patient tolerated treatment well;No increased pain    Behavior During Therapy WFL for tasks assessed/performed           Past Medical History:  Diagnosis Date   Allergic rhinitis    Chlamydia 2008   Infertility, female    Medical history non-contributory    Multiple food allergies    PCOS (polycystic ovarian syndrome)    Pregnancy induced hypertension    Past Surgical History:  Procedure Laterality Date   IR ANGIOGRAM SELECTIVE EACH ADDITIONAL VESSEL  06/20/2019   IR ANGIOGRAM SELECTIVE EACH ADDITIONAL VESSEL  06/20/2019   IR ANGIOGRAM SELECTIVE EACH ADDITIONAL VESSEL  06/20/2019   IR ANGIOGRAM SELECTIVE EACH ADDITIONAL VESSEL  06/20/2019   IR ANGIOGRAM SELECTIVE EACH ADDITIONAL VESSEL  06/20/2019   IR ANGIOGRAM SELECTIVE EACH ADDITIONAL VESSEL  06/20/2019   IR ANGIOGRAM SELECTIVE EACH ADDITIONAL VESSEL  06/20/2019   IR ANGIOGRAM VISCERAL SELECTIVE  06/20/2019   IR EMBO ART  VEN HEMORR LYMPH EXTRAV  INC GUIDE ROADMAPPING  06/20/2019   IR US  GUIDE VASC ACCESS RIGHT  06/20/2019   WISDOM TOOTH EXTRACTION  2013   no problems with anesthesia   Patient Active Problem List   Diagnosis Date Noted   SUI (stress urinary incontinence, female) 10/17/2023   Dyspareunia, female 10/17/2023   Vitamin D  deficiency 07/15/2023   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   Class 1 obesity with serious comorbidity and body mass index (BMI) of 32.0 to 32.9 in adult 06/17/2023   Shellfish allergy  06/17/2023   PCOS (polycystic ovarian syndrome) 06/17/2023   Numerous moles 06/17/2023   History of infertility 06/17/2023   Prolapse  of posterior vaginal wall 06/17/2023   Pregnancy 04/24/2023   Gestational hypertension 09/18/2021   Liver laceration, closed 06/20/2019   Insomnia 02/03/2017   Acne rosacea, papular type 10/12/2015   Allergic rhinitis 10/12/2015    PCP: Wellington Beams FNP   REFERRING PROVIDER: Marne Marseille MD   REFERRING DIAG: rectocele   Rationale for Evaluation and Treatment Rehabilitation  THERAPY DIAG:  Sacrococcygeal disorders, not elsewhere classified  Other abnormalities of gait and mobility  Abnormal posture  Other low back pain  Other low back pain   ONSET DATE: 6 months ago after  delivering 2 child   SUBJECTIVE:                                               SUBJECTIVE STATEMENT TODAY:  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 Pt likes her flip flops and would like to keep wearing them. She is mindful of spreading her toes  SUBJECTIVE STATEMENT ON EVAL 10/16/23 : 1) rectocele started 6 months ago after delivering 2nd child.  Excessive pushing was needed for her 10 lb baby . Son had shoulder dystocia. Pt felt a bulge out of her vagina. It is not hanging out but it is visible. When she inserts a tampon, she can feel the rectal wall and it makes her feel she has to poop. BMs occur daily without straining. With urination, pt has to go again with the feeling of not finishing. No straining with urination.  During sexual intercourse, the prolapse causes discomfort and painful in certain positions some of the time.    2) LBP occurred 5 years ago. Persisted during 1st pregnancy 3 years ago but not worsen due pregnancy. Radiating pain down buttocks on R. Pt tried chiropractor Tx but it did not help. 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  Since this 2nd pregnancy, LBP increased from 3/10 to 5/10. Radiating pain to buttocks only  occur with tweaking back, lifting , bending in the wrong way.  Pain occurs with lifting children ( 6 months old , 22 lbs, 2 years ago - 30 lbs)   Pt points from low rib to the iliac crest bilaterally dull ache.    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     PERTINENT HISTORY:    PAIN:  Are you having pain? Yes: see above  PRECAUTIONS: No  WEIGHT BEARING RESTRICTIONS: No  FALLS:  Has patient fallen in last 6 months? No  LIVING ENVIRONMENT: Lives with: family   Lives in: two story  Stairs: STE 2 with rail    OCCUPATION: RN   PLOF: IND   PATIENT GOALS:  LBP better managed and not worsen prolapse    OBJECTIVE:      Pelvic Floor Special Questions - 12/25/23 1711     External Perineal Exam through clothing :  tightness on R anterior, obt  int  , and tightness along hamstring attachments B          [facility] Adult PT Treatment/Exercise - 12/25/23 1712       Self-Care   Other Self-Care Comments  explained upcoming session for pelvic floor assessment, =explained anatomy and physiology of pelvic floor mm layers 1-3 with deep core coordination, explained hamstring attachments and tightenss affecting pelvic floor,      Neuro Re-ed    Neuro Re-ed Details  cued for propioception :pelvic floor stretch with hamstring stretch combined  with LKC propioception , cued for toespread versus toes clenched and affect of pelvic floor tightness/ relaxation ,      Manual Therapy   Manual therapy comments STM/MWM at R pelvic floor and hamstring             HOME EXERCISE PROGRAM: See pt instruction section    ASSESSMENT:  CLINICAL IMPRESSION: Pt has met 3/6 goals and progressing well towards remaining goals.     Improvements include:  alignment of spine/ pelvis restored to levelled alignment  less  rounded shoulders, hypomobility of C/T junction which will further minimize straining pelvic floor/ worsening prolapse.  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    Assessment today showed R pelvic mm / B  hamstring tightness which manual Tx and LKC / CKC stretches helped to minimize tensions. Cued for propioception :pelvic floor stretch with hamstring stretch combined  with LKC propioception , cued for toespread versus toes clenched and affect of pelvic floor tightness/ relaxation. Explained upcoming session for pelvic floor assessment, explained anatomy and physiology of pelvic floor mm layers 1-3 with deep core coordination, explained hamstring attachments and tightenss affecting pelvic floor,   Anticipate these  new HEP  will to help promote optimize IAP system for improved pelvic floor function, trunk stability, gait, balance, stabilization with mobility tasks.  Plan to continue to assess pelvic floor and improve prolapse.    Regional interdependent approaches will yield greater benefits in pt's POC                                                 Pt benefits from skilled PT.   OBJECTIVE IMPAIRMENTS decreased activity tolerance, decreased coordination, decreased endurance, decreased mobility, difficulty walking, decreased ROM, decreased strength, decreased safety awareness, hypomobility, increased muscle spasms, impaired flexibility, improper body mechanics, postural dysfunction, and pain.    ACTIVITY LIMITATIONS  self-care,   work tasks as a Engineer, civil (consulting)    PARTICIPATION LIMITATIONS:  community, gym activities ( used to do HIIT classes, [employer])    PERSONAL FACTORS   affecting patient's functional outcome:    REHAB POTENTIAL: Good   CLINICAL DECISION MAKING: Evolving/moderate complexity   EVALUATION COMPLEXITY: Moderate    PATIENT EDUCATION:    Education details: Showed pt anatomy images. Explained muscles attachments/ connection, physiology of deep core  system/ spinal- thoracic-pelvis-lower kinetic chain as they relate to pt's presentation, Sx, and past Hx. Explained what and how these areas of deficits need to be restored to balance and function    See Therapeutic activity / neuromuscular re-education section  Answered pt's questions.   Person educated: Patient Education method: Explanation, Demonstration, Tactile cues, Verbal cues, and Handouts Education comprehension: verbalized understanding, returned demonstration, verbal cues required, tactile cues required, and needs further education     PLAN: PT FREQUENCY: 1x/week   PT DURATION: 10 weeks   PLANNED INTERVENTIONS:   Gait training;Stair training;Functional mobility training;DME Instruction;Therapeutic activities;Therapeutic exercise;Balance training;Neuromuscular re-education;Patient/family education;Vestibular;Visual/perceptual remediation/compensation;Passive range of motion;Moist Heat;Cryotherapy;Traction;Canalith Repostioning;Joint Manipulations;Manual lymph drainage;Manual techniques;Scar mobilization;Energy conservation;Dry needling;ADLs/Self Care Home Management;Biofeedback;Electrical Stimulation;Taping    PLAN FOR NEXT SESSION: See clinical impression for plan     GOALS: Goals reviewed with patient? Yes  SHORT TERM GOALS: Target date: 11/13/2023    Pt will demo IND with HEP                    Baseline: Not IND            Goal status: INITIAL   LONG TERM GOALS: Target date:03/04/2024   1.Pt will demo proper deep core coordination without chest breathing and optimal excursion of diaphragm/pelvic floor in order to promote spinal stability and pelvic floor function  Baseline: dyscoordination Goal status:  MET  2.  Pt will demo proper body mechanics in against gravity tasks and ADLs  work tasks, fitness  to minimize straining pelvic floor / back    Baseline: not IND, improper form that places strain on pelvic floor  Goal status: MET     3. Pt will demo  increased gait speed > 1.3 m/s with reciprocal gait pattern, longer  stride length  in order to ambulate safely in community and return to fitness routine  Baseline: minimal posterior rotation of L pelvis, R shoulder, 1.24 m/s  Goal status:     4. Pt will demo levelled pelvic girdle and shoulder height in order to progress to deep core strengthening HEP and restore mobility at spine, pelvis, gait, posture minimize falls, and improve balance  Baseline: L shoulder , R iliac crest lowered, Goal status:  MET    5. Pt will improve PFDI-7 questionnaire to  pts  score change  to demo improved QOL  Baseline:    ( greater pts indicate greater negative impact on QOL)     pts  ( total)    pts  ( UIQ-7 )    pts  ( CRAIQ-7 )    pts  ( POPIQ-7 )  Baseline: plan to administer next session Goal status: Deferred , did not administer   6. Pt will report LBP decreased < 1/10 and no radiating pain with lifting, bending at home, work  and demo  no LBP with B sideflexion in order to restore ADLs, work tasks  Baseline:  LBP occurred 5 years ago  before first pregnancy. Radiating pain down buttocks on R. Pt tried chiropractor Tx but it did not help. 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  Since this 2nd pregnancy, LBP increased from 3/10 to 5/10. Radiating pain to buttocks only occur with tweaking back, lifting , bending in the wrong way. Pt points from low rib to the iliac crest bilaterally dull ache.    B sideflexion caused LBP,  50 cm digit III to floor Bilateral at the onset of pain   Goal status: Partially met  ( L 44 cm, R 45 cm before pain) sideflexion AROM increased but LBP still present with tasks    Pia Lupe Plump, PT 12/25/2023, 4:41 PM

## 2023-12-25 NOTE — Progress Notes (Unsigned)
 SUBJECTIVE:  Chief Complaint: Obesity  Interim History: Patient has been including HIIT therapy classes recently.  She has been very hungry the last month in particular the last month. She is sticking with meal plan about 70% of the time.  When she is at work she will get additional food prior to leaving as she is very hungry.   Samantha Bailey is here to discuss her progress with her obesity treatment plan. She is on the Category 2 Plan and states she is following her eating plan approximately 70 % of the time. She states she is exercising 30-40 minutes 2-3 times per week.  She is working out at home using [employer].     OBJECTIVE: Visit Diagnoses: Problem List Items Addressed This Visit       Other   Class 1 obesity with serious comorbidity and body mass index (BMI) of 32.0 to 32.9 in adult   Relevant Medications   semaglutide -weight management (WEGOVY ) 2.4 MG/0.75ML SOAJ SQ injection   Vitamin D  deficiency   Other Visit Diagnoses       BMI 31.0-31.9,adult    -  Primary       Vitals Temp: 98 F (36.7 C) BP: 98/67 Pulse Rate: 96 SpO2: 99 %   Anthropometric Measurements Height: 5' 3 (1.6 m) Weight: 176 lb (79.8 kg) BMI (Calculated): 31.18 Weight at Last Visit: 176 lb Weight Lost Since Last Visit: 0 Weight Gained Since Last Visit: 0 Starting Weight: 182 lb Total Weight Loss (lbs): 6 lb (2.722 kg)   Body Composition  Body Fat %: 37 % Fat Mass (lbs): 65.4 lbs Muscle Mass (lbs): 105.8 lbs Total Body Water (lbs): 71.2 lbs Visceral Fat Rating : 6   Other Clinical Data Fasting: no Labs: no Today's Visit #: 7 Starting Date: 06/24/23 Comments: Cat 2     ASSESSMENT AND PLAN: Assessment & Plan Vitamin D  deficiency On prescription strength Vitamin D .  Last level done at start of program.  Patient will repeat labs at next appointment.  Refill of prescription strength Vitamin D  sent into pharmacy. Class 1 obesity with serious comorbidity and body mass index (BMI)  of 32.0 to 32.9 in adult, unspecified obesity type  BMI 31.0-31.9,adult    Diet: Samantha Bailey is currently in the action stage of change. As such, her goal is to continue with weight loss efforts and has agreed to the Category 3 Plan with 8 oz at night.  Exercise:  For substantial health benefits, adults should do at least 150 minutes (2 hours and 30 minutes) a week of moderate-intensity, or 75 minutes (1 hour and 15 minutes) a week of vigorous-intensity aerobic physical activity, or an equivalent combination of moderate- and vigorous-intensity aerobic activity. Aerobic activity should be performed in episodes of at least 10 minutes, and preferably, it should be spread throughout the week.  Behavior Modification:  We discussed the following Behavioral Modification Strategies today: increasing lean protein intake, decreasing simple carbohydrates, increasing vegetables, meal planning and cooking strategies, and planning for success. We discussed various medication options to help Samantha Bailey with her weight loss efforts and we both agreed to continue Wegovy  at current dose.  Return in about 8 weeks (around 02/19/2024) for fasting labs and IC.   She was informed of the importance of frequent follow up visits to maximize her success with intensive lifestyle modifications for her multiple health conditions.  Attestation Statements:   Reviewed by clinician on day of visit: allergies, medications, problem list, medical history, surgical history, family history, social  history, and previous encounter notes.     Adelita Cho, MD

## 2023-12-25 NOTE — Patient Instructions (Signed)
 Stretches : (Cuing provided for proper alignment)   Zig zig stretch then move into    IT band  _scoot hips to R, cross R leg over L and straighten knee with strap on [place],  Bend knee back and forth 5x    Hamstring _knee bends  10 reps  With knee pointing straight ( slightly to outside to minimize snapping sensation)      10 reps with knee pointing out towards armpit ( notice the stretch in the medial hamstring muscle)  __    Happy baby: Knees towards armpits  Breath 3-5 breaths, noticing pelvic floor relaxes     Alternating hamstring stretch from happy baby position One knee straightens, ankle 90 deg, toes point towards floor like you are walking on ceiling which stretches adductors and hamstrings    Repeat with other side   __  Deep core level 1-2 (daily)

## 2023-12-26 NOTE — Assessment & Plan Note (Signed)
 On prescription strength Vitamin D .  Last level done at start of program.  Patient will repeat labs at next appointment.  Refill of prescription strength Vitamin D  sent into pharmacy.

## 2024-01-01 ENCOUNTER — Ambulatory Visit: Admitting: Physical Therapy

## 2024-01-01 ENCOUNTER — Ambulatory Visit (INDEPENDENT_AMBULATORY_CARE_PROVIDER_SITE_OTHER): Admitting: Family Medicine

## 2024-01-01 ENCOUNTER — Other Ambulatory Visit: Payer: Self-pay

## 2024-01-01 ENCOUNTER — Encounter: Payer: Self-pay | Admitting: Family Medicine

## 2024-01-01 VITALS — BP 97/63 | HR 91 | Temp 98.2°F | Ht 62.0 in | Wt 180.2 lb

## 2024-01-01 DIAGNOSIS — T753XXA Motion sickness, initial encounter: Secondary | ICD-10-CM | POA: Diagnosis not present

## 2024-01-01 DIAGNOSIS — F3342 Major depressive disorder, recurrent, in full remission: Secondary | ICD-10-CM

## 2024-01-01 DIAGNOSIS — R293 Abnormal posture: Secondary | ICD-10-CM | POA: Diagnosis not present

## 2024-01-01 DIAGNOSIS — E559 Vitamin D deficiency, unspecified: Secondary | ICD-10-CM | POA: Diagnosis not present

## 2024-01-01 DIAGNOSIS — F53 Postpartum depression: Secondary | ICD-10-CM | POA: Diagnosis not present

## 2024-01-01 DIAGNOSIS — M5459 Other low back pain: Secondary | ICD-10-CM

## 2024-01-01 DIAGNOSIS — Z3009 Encounter for other general counseling and advice on contraception: Secondary | ICD-10-CM | POA: Diagnosis not present

## 2024-01-01 DIAGNOSIS — M533 Sacrococcygeal disorders, not elsewhere classified: Secondary | ICD-10-CM

## 2024-01-01 DIAGNOSIS — Z0001 Encounter for general adult medical examination with abnormal findings: Secondary | ICD-10-CM | POA: Diagnosis not present

## 2024-01-01 DIAGNOSIS — R2689 Other abnormalities of gait and mobility: Secondary | ICD-10-CM

## 2024-01-01 DIAGNOSIS — Z0000 Encounter for general adult medical examination without abnormal findings: Secondary | ICD-10-CM

## 2024-01-01 DIAGNOSIS — Z91013 Allergy to seafood: Secondary | ICD-10-CM | POA: Diagnosis not present

## 2024-01-01 MED ORDER — SERTRALINE HCL 50 MG PO TABS
50.0000 mg | ORAL_TABLET | Freq: Every day | ORAL | 3 refills | Status: AC
  Filled 2024-01-01 – 2024-01-13 (×2): qty 90, 90d supply, fill #0
  Filled 2024-02-12 – 2024-04-16 (×2): qty 90, 90d supply, fill #1

## 2024-01-01 MED ORDER — SCOPOLAMINE 1 MG/3DAYS TD PT72
1.0000 | MEDICATED_PATCH | TRANSDERMAL | 0 refills | Status: AC
  Filled 2024-01-01 – 2024-01-13 (×2): qty 5, 15d supply, fill #0

## 2024-01-01 NOTE — Therapy (Signed)
 OUTPATIENT PHYSICAL THERAPY TREATMENT  / [facility]    Patient Name: Samantha Bailey MRN: [id number] DOB:08-20-1990, 33 y.o., female Today's Date: 01/01/2024   PT End of Session - 01/01/24 1542     Visit Number 6    Number of Visits 20    Date for PT Re-Evaluation 03/04/24    PT Start Time 1420    PT Stop Time 1505    PT Time Calculation (min) 45 min           Past Medical History:  Diagnosis Date   Allergic rhinitis    Chlamydia 2008   Infertility, female    Medical history non-contributory    Multiple food allergies    PCOS (polycystic ovarian syndrome)    Pregnancy induced hypertension    Past Surgical History:  Procedure Laterality Date   IR ANGIOGRAM SELECTIVE EACH ADDITIONAL VESSEL  06/20/2019   IR ANGIOGRAM SELECTIVE EACH ADDITIONAL VESSEL  06/20/2019   IR ANGIOGRAM SELECTIVE EACH ADDITIONAL VESSEL  06/20/2019   IR ANGIOGRAM SELECTIVE EACH ADDITIONAL VESSEL  06/20/2019   IR ANGIOGRAM SELECTIVE EACH ADDITIONAL VESSEL  06/20/2019   IR ANGIOGRAM SELECTIVE EACH ADDITIONAL VESSEL  06/20/2019   IR ANGIOGRAM SELECTIVE EACH ADDITIONAL VESSEL  06/20/2019   IR ANGIOGRAM VISCERAL SELECTIVE  06/20/2019   IR EMBO ART  VEN HEMORR LYMPH EXTRAV  INC GUIDE ROADMAPPING  06/20/2019   IR US  GUIDE VASC ACCESS RIGHT  06/20/2019   WISDOM TOOTH EXTRACTION  2013   no problems with anesthesia   Patient Active Problem List   Diagnosis Date Noted   Recurrent major depressive disorder, in full remission (HCC) 01/01/2024   SUI (stress urinary incontinence, female) 10/17/2023   Dyspareunia, female 10/17/2023   Vitamin D  deficiency 07/15/2023   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   Class 1 obesity with serious comorbidity and body mass index (BMI) of 32.0 to 32.9 in adult 06/17/2023   Shellfish allergy  06/17/2023   PCOS (polycystic ovarian syndrome) 06/17/2023   Numerous moles 06/17/2023   History of infertility 06/17/2023   Prolapse of posterior vaginal wall 06/17/2023   Pregnancy  04/24/2023   Gestational hypertension 09/18/2021   Liver laceration, closed 06/20/2019   Insomnia 02/03/2017   Acne rosacea, papular type 10/12/2015   Allergic rhinitis 10/12/2015    PCP: Wellington Beams FNP   REFERRING PROVIDER: Marne Marseille MD   REFERRING DIAG: rectocele   Rationale for Evaluation and Treatment Rehabilitation  THERAPY DIAG:  Sacrococcygeal disorders, not elsewhere classified  Other abnormalities of gait and mobility  Abnormal posture  Other low back pain  Other low back pain   ONSET DATE: 6 months ago after  delivering 2 child   SUBJECTIVE:                                               SUBJECTIVE STATEMENT TODAY:  Pt reported she has been doing the band exercise   SUBJECTIVE STATEMENT ON EVAL 10/16/23 : 1) rectocele started 6 months ago after delivering 2nd child.  Excessive pushing was needed for her 10 lb baby . Son had shoulder dystocia. Pt felt a bulge out of her vagina. It is not hanging out but it is visible. When she inserts a tampon, she can feel the rectal wall and it makes her feel she has to poop. BMs occur daily without straining. With urination, pt has to go  again with the feeling of not finishing. No straining with urination.  During sexual intercourse, the prolapse causes discomfort and painful in certain positions some of the time.    2) LBP occurred 5 years ago. Persisted during 1st pregnancy 3 years ago but not worsen due pregnancy. Radiating pain down buttocks on R. Pt tried chiropractor Tx but it did not help. 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  Since this 2nd pregnancy, LBP increased from 3/10 to 5/10. Radiating pain to buttocks only occur with tweaking back, lifting , bending in the wrong way.  Pain occurs with lifting children ( 6 months old , 22 lbs, 2 years ago - 30 lbs)   Pt points from low rib to the iliac crest bilaterally dull ache.    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     PERTINENT HISTORY:    PAIN:  Are you having pain? Yes: see above  PRECAUTIONS: No  WEIGHT BEARING RESTRICTIONS: No  FALLS:  Has patient fallen in last 6 months? No  LIVING ENVIRONMENT: Lives with: family   Lives in: two story  Stairs: STE 2 with rail    OCCUPATION: RN   PLOF: IND   PATIENT GOALS:  LBP better managed and not worsen prolapse    OBJECTIVE:     [facility] PT Assessment - 01/01/24 1536       Palpation   Spinal mobility levelled pelvis and shoulders, less rounded shoulders    Palpation comment flexed coccyx, hypomobile and tenderness along R SIJ ,  tightness along transverse perinal  / ischial mm tuberosity  attachments              [facility] PT Assessment - 01/01/24 1536       Palpation   Spinal mobility levelled pelvis and shoulders, less rounded shoulders    Palpation comment flexed coccyx, hypomobile and tenderness along R SIJ ,  tightness along transverse perinal  / ischial mm tuberosity  attachments           HOME EXERCISE PROGRAM: See pt instruction section    ASSESSMENT:  CLINICAL IMPRESSION:   Improvements include:  alignment of spine/ pelvis restored to levelled alignment  less  rounded shoulders, hypomobility of C/T junction which will further minimize straining pelvic floor/ worsening prolapse.  Pt is practicing techniques with use of LKC and less straining of pelvic floor with patient handling as a RN which will help minimize prolapse and straining to pelvic floor  Improved coordination with deep core level 1-2 training.     Assessment today showed flexed coccyx and R SIJ hypomobility which manual Tx addressed. Pt showed improved pelvic floor lengthening and less flexed coccyx. Pt reported in the past, pt had felt pain in her coccyx. After Tx, pt reported feeling the area being more relaxed. Pt showed improved coordination with pelvic floor lengthening with deep core exercises which will  further help with her pelvic floor issues.   Plan to perform internal pelvic floor assessment as pt has provided consent.   Anticipate these  new HEP  will to help promote optimize IAP system for improved pelvic floor function, trunk stability, gait, balance, stabilization with mobility tasks.  Plan to continue to assess pelvic floor and improve prolapse.    Regional interdependent approaches will yield greater benefits in pt's POC  Pt benefits from skilled PT.   OBJECTIVE IMPAIRMENTS decreased activity tolerance, decreased coordination, decreased endurance, decreased mobility, difficulty walking, decreased ROM, decreased strength, decreased safety awareness, hypomobility, increased muscle spasms, impaired flexibility, improper body mechanics, postural dysfunction, and pain.    ACTIVITY LIMITATIONS  self-care,   work tasks as a Engineer, civil (consulting)    PARTICIPATION LIMITATIONS:  community, gym activities ( used to do HIIT classes, [employer])    PERSONAL FACTORS   affecting patient's functional outcome:    REHAB POTENTIAL: Good   CLINICAL DECISION MAKING: Evolving/moderate complexity   EVALUATION COMPLEXITY: Moderate    PATIENT EDUCATION:    Education details: Showed pt anatomy images. Explained muscles attachments/ connection, physiology of deep core system/ spinal- thoracic-pelvis-lower kinetic chain as they relate to pt's presentation, Sx, and past Hx. Explained what and how these areas of deficits need to be restored to balance and function    See Therapeutic activity / neuromuscular re-education section  Answered pt's questions.   Person educated: Patient Education method: Explanation, Demonstration, Tactile cues, Verbal cues, and Handouts Education comprehension: verbalized understanding, returned demonstration, verbal cues required, tactile cues required, and needs further education     PLAN: PT FREQUENCY: 1x/week   PT DURATION: 10  weeks   PLANNED INTERVENTIONS:   Gait training;Stair training;Functional mobility training;DME Instruction;Therapeutic activities;Therapeutic exercise;Balance training;Neuromuscular re-education;Patient/family education;Vestibular;Visual/perceptual remediation/compensation;Passive range of motion;Moist Heat;Cryotherapy;Traction;Canalith Repostioning;Joint Manipulations;Manual lymph drainage;Manual techniques;Scar mobilization;Energy conservation;Dry needling;ADLs/Self Care Home Management;Biofeedback;Electrical Stimulation;Taping    PLAN FOR NEXT SESSION: See clinical impression for plan     GOALS: Goals reviewed with patient? Yes  SHORT TERM GOALS: Target date: 11/13/2023    Pt will demo IND with HEP                    Baseline: Not IND            Goal status: INITIAL   LONG TERM GOALS: Target date:03/04/2024   1.Pt will demo proper deep core coordination without chest breathing and optimal excursion of diaphragm/pelvic floor in order to promote spinal stability and pelvic floor function  Baseline: dyscoordination Goal status:  MET  2.  Pt will demo proper body mechanics in against gravity tasks and ADLs  work tasks, fitness  to minimize straining pelvic floor / back    Baseline: not IND, improper form that places strain on pelvic floor  Goal status: MET     3. Pt will demo increased gait speed > 1.3 m/s with reciprocal gait pattern, longer stride length  in order to ambulate safely in community and return to fitness routine  Baseline: minimal posterior rotation of L pelvis, R shoulder, 1.24 m/s  Goal status:     4. Pt will demo levelled pelvic girdle and shoulder height in order to progress to deep core strengthening HEP and restore mobility at spine, pelvis, gait, posture minimize falls, and improve balance  Baseline: L shoulder , R iliac crest lowered, Goal status:  MET    5. Pt will improve PFDI-7 questionnaire to  pts  score change  to demo improved QOL  Baseline:     ( greater pts indicate greater negative impact on QOL)     pts  ( total)    pts  ( UIQ-7 )    pts  ( CRAIQ-7 )    pts  ( POPIQ-7 )  Baseline: plan to administer next session Goal status: Deferred , did not administer   6. Pt will report LBP decreased < 1/10 and no  radiating pain with lifting, bending at home, work  and demo  no LBP with B sideflexion in order to restore ADLs, work tasks  Baseline:  LBP occurred 5 years ago  before first pregnancy. Radiating pain down buttocks on R. Pt tried chiropractor Tx but it did not help. 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  Since this 2nd pregnancy, LBP increased from 3/10 to 5/10. Radiating pain to buttocks only occur with tweaking back, lifting , bending in the wrong way. Pt points from low rib to the iliac crest bilaterally dull ache.    B sideflexion caused LBP,  50 cm digit III to floor Bilateral at the onset of pain   Goal status: Partially met  ( L 44 cm, R 45 cm before pain) sideflexion AROM increased but LBP still present with tasks    Pia Lupe Plump, PT 01/01/2024, 3:42 PM

## 2024-01-01 NOTE — Progress Notes (Signed)
 Complete physical exam  Patient: Samantha Bailey   DOB: 1991-04-07   33 y.o. Female  MRN: [id number]  Introduced to nurse practitioner role and practice setting.  All questions answered.  Discussed provider/patient relationship and expectations.  Subjective:    Chief Complaint  Patient presents with   Annual Exam    Diet- General, watching what she eats Exercise- Works out 2-3 times a week Overall Feeling- Good Sleep- Good Concerns- None  Flu Vaccine- will get at work    [name] is a 33 y.o. female who presents today for a complete physical exam. She reports consuming a general diet. Gym/ health club routine includes cardio and light weights. She generally feels well. She reports sleeping well. She does not have additional problems to discuss today.   Discussed the use of AI scribe software for clinical note transcription with the patient, who gave verbal consent to proceed.  History of Present Illness Samantha Bailey is a 33 year old female who presents for an annual physical exam and medication management. She is accompanied by her children, Dusty and Derrill.  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 Her mother assists with childcare during her workdays.  She is working with a weight management clinic and has started Wegovy  for weight loss, which she tolerates well without any adverse side effects. She continues to take Zoloft  50 mg daily for postpartum depression and major depressive disorder. She also uses ibuprofen  as needed and has been started on a weekly vitamin D  supplement due to low levels.  She is not currently on birth control as she is considering having a third child and is using condoms for contraception. Her menstrual cycle has resumed and is somewhat regular, starting around the fifteenth of each month. She is not breastfeeding.  She plans to get her  flu vaccine at work. She has not had her eyes checked in the past year but reports good vision. She is overdue for a Pap smear by a couple of months.  She is planning a four to five-day cruise in October and requests scopolamine  patches for motion sickness. No falls, her mood is good, and she has no breast concerns.     Most recent fall risk assessment:    06/17/2023    9:25 AM  Fall Risk   Falls in the past year? 0  Number falls in past yr: 0  Injury with Fall? 0  Risk for fall due to : No Fall Risks  Follow up Falls evaluation completed     Most recent depression screenings:    06/24/2023   11:55 AM 06/24/2023   10:09 AM  PHQ 2/9 Scores  PHQ - 2 Score 0 0  PHQ- 9 Score 0 0      06/17/2023    9:25 AM 02/03/2017   11:58 AM  GAD 7 : Generalized Anxiety Score  Nervous, Anxious, on Edge 0 3  Control/stop worrying 0 3  Worry too much - different things 0 3  Trouble relaxing 0 3  Restless 0 1  Easily annoyed or irritable 1 3  Afraid - awful might happen 0 3  Total GAD 7 Score 1 19  Anxiety Difficulty Not difficult at all Extremely difficult      Vision:Not within last year  and Dental: No current dental problems and Receives regular dental care  Patient Active Problem List   Diagnosis  Date Noted   Recurrent major depressive disorder, in full remission (HCC) 01/01/2024   SUI (stress urinary incontinence, female) 10/17/2023   Dyspareunia, female 10/17/2023   Vitamin D  deficiency 07/15/2023   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   Class 1 obesity with serious comorbidity and body mass index (BMI) of 32.0 to 32.9 in adult 06/17/2023   Shellfish allergy  06/17/2023   PCOS (polycystic ovarian syndrome) 06/17/2023   Numerous moles 06/17/2023   History of infertility 06/17/2023   Prolapse of posterior vaginal wall 06/17/2023   Pregnancy 04/24/2023   Gestational hypertension 09/18/2021   Liver laceration, closed 06/20/2019   Insomnia 02/03/2017   Acne rosacea, papular type  10/12/2015   Allergic rhinitis 10/12/2015   Past Medical History:  Diagnosis Date   Allergic rhinitis    Chlamydia 2008   Infertility, female    Medical history non-contributory    Multiple food allergies    PCOS (polycystic ovarian syndrome)    Pregnancy induced hypertension    Past Surgical History:  Procedure Laterality Date   IR ANGIOGRAM SELECTIVE EACH ADDITIONAL VESSEL  06/20/2019   IR ANGIOGRAM SELECTIVE EACH ADDITIONAL VESSEL  06/20/2019   IR ANGIOGRAM SELECTIVE EACH ADDITIONAL VESSEL  06/20/2019   IR ANGIOGRAM SELECTIVE EACH ADDITIONAL VESSEL  06/20/2019   IR ANGIOGRAM SELECTIVE EACH ADDITIONAL VESSEL  06/20/2019   IR ANGIOGRAM SELECTIVE EACH ADDITIONAL VESSEL  06/20/2019   IR ANGIOGRAM SELECTIVE EACH ADDITIONAL VESSEL  06/20/2019   IR ANGIOGRAM VISCERAL SELECTIVE  06/20/2019   IR EMBO ART  VEN HEMORR LYMPH EXTRAV  INC GUIDE ROADMAPPING  06/20/2019   IR US  GUIDE VASC ACCESS RIGHT  06/20/2019   WISDOM TOOTH EXTRACTION  2013   no problems with anesthesia   Social History   Tobacco Use   Smoking status: Never    Passive exposure: Never   Smokeless tobacco: Never  Vaping Use   Vaping status: Never Used  Substance Use Topics   Alcohol use: No   Drug use: Never   Allergies  Allergen Reactions   Shellfish Allergy  Anaphylaxis   Shellfish Allergy  Swelling, Other (See Comments) and Itching      Patient Care Team: [name], FNP as PCP - General (Family Medicine) Meisinger, Krystal, MD as Consulting Physician (Obstetrics and Gynecology)   Outpatient Medications Prior to Visit  Medication Sig   azelastine  (ASTELIN ) 0.1 % nasal spray Place 1-2 sprays into both nostrils 2 (two) times daily as needed (nasal drainage). Use in each nostril as directed   EPINEPHrine  0.3 mg/0.3 mL IJ SOAJ injection Inject 0.3 mg into the muscle as needed for anaphylaxis.   fluticasone  (FLONASE ) 50 MCG/ACT nasal spray Place 1-2 sprays into both nostrils daily as needed (nasal congestion).    mometasone  (ELOCON ) 0.1 % lotion Apply 3-5 drops in the ears 2 (two) times a week at bedtime.   semaglutide -weight management (WEGOVY ) 2.4 MG/0.75ML SOAJ SQ injection Inject 2.4 mg into the skin once a week.   Vitamin D , Ergocalciferol , (DRISDOL ) 1.25 MG (50000 UNIT) CAPS capsule Take 1 capsule (50,000 Units total) by mouth every 7 (seven) days.   [DISCONTINUED] sertraline  (ZOLOFT ) 50 MG tablet Take 1 tablet (50 mg total) by mouth daily.   [DISCONTINUED] doxylamine , Sleep, (UNISOM  SLEEPTABS) 25 MG tablet Take 1 tablet every day by oral route for 28 days. (Patient not taking: Reported on 07/14/2023)   No facility-administered medications prior to visit.    ROS        Objective:     BP 97/63 (BP  Location: Left Arm, Patient Position: Sitting, Cuff Size: Normal)   Pulse 91   Temp 98.2 F (36.8 C) (Oral)   Ht 5' 2 (1.575 m)   Wt 180 lb 3.2 oz (81.7 kg)   LMP 12/05/2023   SpO2 100%   BMI 32.96 kg/m  BP Readings from Last 3 Encounters:  01/01/24 97/63  12/25/23 98/67  11/24/23 (!) 104/59   Wt Readings from Last 3 Encounters:  01/01/24 180 lb 3.2 oz (81.7 kg)  12/25/23 176 lb (79.8 kg)  11/24/23 176 lb (79.8 kg)      Physical Exam Constitutional:      General: She is not in acute distress.    Appearance: Normal appearance. She is not ill-appearing, toxic-appearing or diaphoretic.  HENT:     Head: Normocephalic.     Right Ear: Tympanic membrane normal.     Left Ear: Tympanic membrane normal.     Nose: Nose normal. No congestion or rhinorrhea.     Mouth/Throat:     Mouth: Mucous membranes are moist.     Pharynx: Oropharynx is clear. No oropharyngeal exudate or posterior oropharyngeal erythema.  Eyes:     General: Lids are normal.        Right eye: No discharge.        Left eye: No discharge.     Extraocular Movements: Extraocular movements intact.     Right eye: Normal extraocular motion.     Left eye: Normal extraocular motion.     Conjunctiva/sclera: Conjunctivae  normal.     Right eye: Right conjunctiva is not injected.     Left eye: Left conjunctiva is not injected.     Pupils: Pupils are equal, round, and reactive to light.  Neck:     Thyroid : No thyroid  mass, thyromegaly or thyroid  tenderness.     Vascular: No carotid bruit.  Cardiovascular:     Rate and Rhythm: Normal rate.     Pulses: Normal pulses.          Radial pulses are 2+ on the right side and 2+ on the left side.       Posterior tibial pulses are 2+ on the right side and 2+ on the left side.     Heart sounds: Normal heart sounds, S1 normal and S2 normal. No murmur heard.    No friction rub. No gallop.  Pulmonary:     Effort: Pulmonary effort is normal. No respiratory distress.     Breath sounds: Normal breath sounds. No stridor. No wheezing, rhonchi or rales.  Abdominal:     General: Bowel sounds are normal. There is no distension.     Palpations: Abdomen is soft. There is no mass.     Tenderness: There is no abdominal tenderness. There is no right CVA tenderness, left CVA tenderness, guarding or rebound.     Hernia: No hernia is present.  Musculoskeletal:        General: No swelling or tenderness. Normal range of motion.     Cervical back: Normal range of motion. No rigidity or tenderness.     Right lower leg: No edema.     Left lower leg: No edema.  Lymphadenopathy:     Cervical: No cervical adenopathy.     Right cervical: No superficial, deep or posterior cervical adenopathy.    Left cervical: No superficial, deep or posterior cervical adenopathy.  Skin:    General: Skin is warm and dry.     Capillary Refill: Capillary refill takes less than  2 seconds.     Findings: No bruising or erythema.  Neurological:     General: No focal deficit present.     Mental Status: She is alert and oriented to person, place, and time. Mental status is at baseline.     GCS: GCS eye subscore is 4. GCS verbal subscore is 5. GCS motor subscore is 6.     Cranial Nerves: No cranial nerve deficit.      Sensory: No sensory deficit.     Motor: No weakness, tremor or pronator drift.     Coordination: Romberg sign negative. Coordination normal.     Gait: Gait is intact. Gait normal.  Psychiatric:        Attention and Perception: Attention and perception normal.        Mood and Affect: Mood and affect normal.        Speech: Speech normal.        Behavior: Behavior normal. Behavior is cooperative.        Thought Content: Thought content normal.        Cognition and Memory: Cognition and memory normal.        Judgment: Judgment normal.      No results found for any visits on 01/01/24.     Assessment & Plan:    Routine Health Maintenance and Physical Exam  Assessment and Plan Assessment & Plan Adult Wellness Visit - Annual physical examination was unremarkable.  - She will receive her flu vaccine at work, declining it today.  Things to do to keep yourself healthy  - Exercise at least 30-45 minutes a day, 3-4 days a week.  - Eat a low-fat diet with lots of fruits and vegetables, up to 7-9 servings per day.  - Seatbelts can save your life. Wear them always.  - Smoke detectors on every level of your home, check batteries every year.  - Eye Doctor - have an eye exam every 1-2 years  - Safe sex - if you may be exposed to STDs, use a condom.  - Alcohol -  If you drink, do it moderately, less than 2 drinks per day.  - Health Care Power of Attorney. Choose someone to speak for you if you are not able.  - Depression is common in our stressful world.If you're feeling down or losing interest in things you normally enjoy, please come in for a visit.  - Violence - If anyone is threatening or hurting you, please call immediately.  Allergy  to Shellfish - has UTD EpiPen   Family Planning - using condoms, but in next year her and husband may start trying for third pregnancy - recommend daily prenatal  Postpartum depression and major depressive disorder, remission Postpartum depression  and major depressive disorder are being managed with sertraline  50 mg daily. She reports no falls and her mood is good. She is satisfied with the current dosage of sertraline . - Continue sertraline  50 mg daily - F/u 6 months  Class 1, Obese, BMI = 32.96 Obesity is being managed with Wegovy  2.4mg  weekly, prescribed by the weight management clinic. She reports no adverse side effects and is managing well with the medication. - Continue Wegovy  as prescribed by the weight management clinic  Vitamin D  deficiency Vitamin D  deficiency is being managed with a weekly vitamin D  supplement. . - Continue weekly vitamin D  supplementation - prescribed by Dr. [place].  Motion sickness prophylaxis for upcoming travel She is planning a cruise in October and requires prophylaxis for motion sickness. She  gets quite sea sick, historically has taken scopolamine  patches for this purpose. Requesting short term supply. - Prescribe scopolamine  patches for motion sickness prophylaxis during upcoming cruise  Health Maintenance  Topic Date Due   COVID-19 Vaccine (1) Never done   Pap with HPV screening  10/13/2023   Flu Shot  11/21/2023   DTaP/Tdap/Td vaccine (8 - Td or Tdap) 07/18/2029   Hepatitis B Vaccine  Completed   HPV Vaccine  Completed   Hepatitis C Screening  Completed   HIV Screening  Completed   Pneumococcal Vaccine  Aged Out   Meningitis B Vaccine  Aged Out    Discussed health benefits of physical activity, and encouraged her to engage in regular exercise appropriate for her age and condition.  Annual physical exam  Post partum depression -     Sertraline  HCl; Take 1 tablet (50 mg total) by mouth daily.  Dispense: 90 tablet; Refill: 3  Family planning  Recurrent major depressive disorder, in full remission (HCC)  Shellfish allergy   Vitamin D  deficiency  Motion sickness, initial encounter -     Scopolamine ; Place 1 patch (1 mg total) onto the skin every 3 (three) days.  Dispense: 5 patch;  Refill: 0     Return in about 6 months (around 06/30/2024) for Mood Check.     Curtis DELENA Boom, FNP

## 2024-01-01 NOTE — Patient Instructions (Signed)
 Stretch for pelvic floor    On belly: Riding horse edge of mattress  knee bent like riding a horse, move knee towards armpit and out  10 reps   Childs pose rocking    Toes tucked, shoulders down and back, on forearms , hands shoulder width apart, fingers straight, elbow back , squeeze imaginary pencils in armpit, shoulder down and away from ears   10 reps    __   3 way childs pose ( center, to the R, to the L)            L hand on L hip, rotate and look up at the ceiling L  ( for this week to realign spine)    Later, you can do both sides  __

## 2024-01-12 ENCOUNTER — Other Ambulatory Visit: Payer: Self-pay

## 2024-01-13 ENCOUNTER — Other Ambulatory Visit: Payer: Self-pay

## 2024-01-14 ENCOUNTER — Ambulatory Visit: Admitting: Allergy

## 2024-01-15 ENCOUNTER — Ambulatory Visit: Admitting: Physical Therapy

## 2024-01-15 DIAGNOSIS — R2689 Other abnormalities of gait and mobility: Secondary | ICD-10-CM | POA: Diagnosis not present

## 2024-01-15 DIAGNOSIS — R293 Abnormal posture: Secondary | ICD-10-CM | POA: Diagnosis not present

## 2024-01-15 DIAGNOSIS — M5459 Other low back pain: Secondary | ICD-10-CM | POA: Diagnosis not present

## 2024-01-15 DIAGNOSIS — M533 Sacrococcygeal disorders, not elsewhere classified: Secondary | ICD-10-CM

## 2024-01-15 NOTE — Patient Instructions (Addendum)
 BAND AT DOOR   At the doot, back turned away W arms  with minsquats    30 reps squats, make sure toe tips are relaxed         Multifidis twist    Band is on doorknob: sit facing perpendicular to door , standhalfway towards front of chair, firm through 4 points of contact at buttocks and feet. Feet are placed hip with apart.    Twisting trunk without moving the hips and knees Hold band at the level of ribcage, elbows bent,shoulder blades roll back and down like squeezing a pencil under armpit   Exhale twist,.10-15 deg away from door without moving your hips/ knees, press more weight on the side of the sitting bones/ foot opp of your direction of turn as your counterweight. Continue to maintain equal weight through legs.  Keep knee unlocked.  30 reps      Dolphin squats on wall   Fingers interlaced, elbows shoulder width apart, forearms in a triangle pressing against the wall Mini squat position   Inhale, exhale chin tucked, shoulders lengthen down from ears, as you rise up not locking knees, hairline brushes by thumbs ( like dolphin snout diving up out of the water  15 reps.  Make sure to not let the front ribs flare out, ribs over the pelvis aligned to not have a swayed back  Pressure through ballmounds to rise up, and dont lock the knees         Standing with weight bearing across ballmounds and heels,

## 2024-01-15 NOTE — Therapy (Signed)
 OUTPATIENT PHYSICAL THERAPY TREATMENT     Patient Name: Samantha Bailey MRN: [id number] DOB:October 21, 1990, 33 y.o., female Today's Date: 01/15/2024    PT End of Session - 01/15/24 1534     Visit Number 7    Number of Visits 20    Date for Recertification  03/04/24    PT Start Time 1420    PT Stop Time 1505    PT Time Calculation (min) 45 min            Past Medical History:  Diagnosis Date   Allergic rhinitis    Chlamydia 2008   Infertility, female    Medical history non-contributory    Multiple food allergies    PCOS (polycystic ovarian syndrome)    Pregnancy induced hypertension    Past Surgical History:  Procedure Laterality Date   IR ANGIOGRAM SELECTIVE EACH ADDITIONAL VESSEL  06/20/2019   IR ANGIOGRAM SELECTIVE EACH ADDITIONAL VESSEL  06/20/2019   IR ANGIOGRAM SELECTIVE EACH ADDITIONAL VESSEL  06/20/2019   IR ANGIOGRAM SELECTIVE EACH ADDITIONAL VESSEL  06/20/2019   IR ANGIOGRAM SELECTIVE EACH ADDITIONAL VESSEL  06/20/2019   IR ANGIOGRAM SELECTIVE EACH ADDITIONAL VESSEL  06/20/2019   IR ANGIOGRAM SELECTIVE EACH ADDITIONAL VESSEL  06/20/2019   IR ANGIOGRAM VISCERAL SELECTIVE  06/20/2019   IR EMBO ART  VEN HEMORR LYMPH EXTRAV  INC GUIDE ROADMAPPING  06/20/2019   IR US  GUIDE VASC ACCESS RIGHT  06/20/2019   WISDOM TOOTH EXTRACTION  2013   no problems with anesthesia   Patient Active Problem List   Diagnosis Date Noted   Recurrent major depressive disorder, in full remission 01/01/2024   SUI (stress urinary incontinence, female) 10/17/2023   Dyspareunia, female 10/17/2023   Vitamin D  deficiency 07/15/2023   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   Class 1 obesity with serious comorbidity and body mass index (BMI) of 32.0 to 32.9 in adult 06/17/2023   Shellfish allergy  06/17/2023   PCOS (polycystic ovarian syndrome) 06/17/2023   Numerous moles 06/17/2023   History of infertility 06/17/2023   Prolapse of posterior vaginal wall 06/17/2023   Pregnancy 04/24/2023    Gestational hypertension 09/18/2021   Liver laceration, closed 06/20/2019   Insomnia 02/03/2017   Acne rosacea, papular type 10/12/2015   Allergic rhinitis 10/12/2015    PCP: Wellington Beams FNP   REFERRING PROVIDER: Marne Marseille MD   REFERRING DIAG: rectocele   Rationale for Evaluation and Treatment Rehabilitation  THERAPY DIAG:  Sacrococcygeal disorders, not elsewhere classified  Other abnormalities of gait and mobility  Abnormal posture  Other low back pain  Other low back pain   ONSET DATE: 6 months ago after  delivering 2 child   SUBJECTIVE:                                               SUBJECTIVE STATEMENT TODAY:  Pt reported she has been noticing herself correcting her sitting posture to not sit on her tailbone  SUBJECTIVE STATEMENT ON EVAL 10/16/23 : 1) rectocele started 6 months ago after delivering 2nd child.  Excessive pushing was needed for her 10 lb baby . Son had shoulder dystocia. Pt felt a bulge out of her vagina. It is not hanging out but it is visible. When she inserts a tampon, she can feel the rectal wall and it makes her feel she has to poop. BMs occur daily without straining.  With urination, pt has to go again with the feeling of not finishing. No straining with urination.  During sexual intercourse, the prolapse causes discomfort and painful in certain positions some of the time.    2) LBP occurred 5 years ago. Persisted during 1st pregnancy 3 years ago but not worsen due pregnancy. Radiating pain down buttocks on R. Pt tried chiropractor Tx but it did not help. 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  Since this 2nd pregnancy, LBP increased from 3/10 to 5/10. Radiating pain to buttocks only occur with tweaking back, lifting , bending in the wrong way.  Pain occurs with lifting children ( 6 months old , 22 lbs, 2 years ago - 30 lbs)   Pt points from low rib to the iliac crest bilaterally  dull ache.    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     PERTINENT HISTORY:    PAIN:  Are you having pain? Yes: see above  PRECAUTIONS: No  WEIGHT BEARING RESTRICTIONS: No  FALLS:  Has patient fallen in last 6 months? No  LIVING ENVIRONMENT: Lives with: family   Lives in: two story  Stairs: STE 2 with rail    OCCUPATION: RN   PLOF: IND   PATIENT GOALS:  LBP better managed and not worsen prolapse    OBJECTIVE:     [facility] PT Assessment - 01/15/24 1525       Observation/Other Assessments   Observations hyperextended knees, waering flipflops      Squat   Comments knees anterior to feet, increased lumbar lordosis ,  overuse of upper traps with new CKC HEP dolphin plank  against wall      Palpation   Palpation comment coccyx no more flexed, hypomobile lower hip abductor mm R , SIJ, R,  tightness and tenderness            [facility] Adult PT Treatment/Exercise - 01/15/24 1526       Neuro Re-ed    Neuro Re-ed Details  excessive cues for [facility] propioception and less lumbar lordosis for squats and cued for cervicoscapular stabilization with dolphin plank by wall with less overuse of upper traps,  cued for propicoeption of LKC for less hyperextended knees and tightness along SIJ and coccyx      Manual Therapy   Manual therapy comments PA mob Grade III, STMMWM at R SIJ to promote mobility of ilia ER/ abd  ,           HOME EXERCISE PROGRAM: See pt instruction section    ASSESSMENT:  CLINICAL IMPRESSION:   Improvements include:  alignment of spine/ pelvis restored to levelled alignment  less  rounded shoulders, hypomobility of C/T junction which will further minimize straining pelvic floor/ worsening prolapse.  Pt is practicing techniques with use of LKC and less straining of pelvic floor with patient handling as a RN which will help minimize prolapse and straining to pelvic floor  Improved coordination with deep core level 1-2 training.   TOday: pt no longer showed flexed coccyx but still required manual Tx to improve R hypombile SIJ.   Posture training with cues for propicoeption of LKC for less hyperextended knees and tightness along SIJ and coccyx .    Advanced to upright exercises and pt required  excessive cues for [facility] propioception and less lumbar lordosis for squats and cued for cervicoscapular stabilization with dolphin plank  by wall with less overuse of upper traps. Pt demo'd correctly post Tx. Pt will continue to improve with less rounded shoulders which will help with overall posture and pelvic function  Plan to perform internal pelvic floor assessment as pt has provided consent.   Anticipate these  new HEP  will to help promote optimize IAP system for improved pelvic floor function, trunk stability, gait, balance, stabilization with mobility tasks.  Plan to continue to assess pelvic floor and improve prolapse.    Regional interdependent approaches will yield greater benefits in pt's POC                                                 Pt benefits from skilled PT.   OBJECTIVE IMPAIRMENTS decreased activity tolerance, decreased coordination, decreased endurance, decreased mobility, difficulty walking, decreased ROM, decreased strength, decreased safety awareness, hypomobility, increased muscle spasms, impaired flexibility, improper body mechanics, postural dysfunction, and pain.    ACTIVITY LIMITATIONS  self-care,   work tasks as a Engineer, civil (consulting)    PARTICIPATION LIMITATIONS:  community, gym activities ( used to do HIIT classes, [employer])    PERSONAL FACTORS   affecting patient's functional outcome:    REHAB POTENTIAL: Good   CLINICAL DECISION MAKING: Evolving/moderate complexity   EVALUATION COMPLEXITY: Moderate    PATIENT EDUCATION:    Education details: Showed pt anatomy images. Explained muscles attachments/ connection, physiology of deep core system/ spinal- thoracic-pelvis-lower kinetic chain as they  relate to pt's presentation, Sx, and past Hx. Explained what and how these areas of deficits need to be restored to balance and function    See Therapeutic activity / neuromuscular re-education section  Answered pt's questions.   Person educated: Patient Education method: Explanation, Demonstration, Tactile cues, Verbal cues, and Handouts Education comprehension: verbalized understanding, returned demonstration, verbal cues required, tactile cues required, and needs further education     PLAN: PT FREQUENCY: 1x/week   PT DURATION: 10 weeks   PLANNED INTERVENTIONS:   Gait training;Stair training;Functional mobility training;DME Instruction;Therapeutic activities;Therapeutic exercise;Balance training;Neuromuscular re-education;Patient/family education;Vestibular;Visual/perceptual remediation/compensation;Passive range of motion;Moist Heat;Cryotherapy;Traction;Canalith Repostioning;Joint Manipulations;Manual lymph drainage;Manual techniques;Scar mobilization;Energy conservation;Dry needling;ADLs/Self Care Home Management;Biofeedback;Electrical Stimulation;Taping    PLAN FOR NEXT SESSION: See clinical impression for plan     GOALS: Goals reviewed with patient? Yes  SHORT TERM GOALS: Target date: 11/13/2023    Pt will demo IND with HEP                    Baseline: Not IND            Goal status: INITIAL   LONG TERM GOALS: Target date:03/04/2024   1.Pt will demo proper deep core coordination without chest breathing and optimal excursion of diaphragm/pelvic floor in order to promote spinal stability and pelvic floor function  Baseline: dyscoordination Goal status:  MET  2.  Pt will demo proper body mechanics in against gravity tasks and ADLs  work tasks, fitness  to minimize straining pelvic floor / back    Baseline: not IND, improper form that places strain on pelvic floor  Goal status: MET     3. Pt will demo increased gait speed > 1.3 m/s with reciprocal gait pattern, longer  stride length  in order to ambulate safely in community and return to fitness routine  Baseline: minimal posterior rotation of L pelvis, R shoulder, 1.24 m/s  Goal status:     4. Pt will demo levelled pelvic girdle and shoulder height in order to progress to deep core strengthening HEP and restore mobility at spine, pelvis, gait, posture minimize falls, and improve balance  Baseline: L shoulder , R iliac crest lowered, Goal status:  MET    5. Pt will improve PFDI-7 questionnaire to  pts  score change  to demo improved QOL  Baseline:    ( greater pts indicate greater negative impact on QOL)     pts  ( total)    pts  ( UIQ-7 )    pts  ( CRAIQ-7 )    pts  ( POPIQ-7 )  Baseline: plan to administer next session Goal status: Deferred , did not administer   6. Pt will report LBP decreased < 1/10 and no radiating pain with lifting, bending at home, work  and demo  no LBP with B sideflexion in order to restore ADLs, work tasks  Baseline:  LBP occurred 5 years ago  before first pregnancy. Radiating pain down buttocks on R. Pt tried chiropractor Tx but it did not help. 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  Since this 2nd pregnancy, LBP increased from 3/10 to 5/10. Radiating pain to buttocks only occur with tweaking back, lifting , bending in the wrong way. Pt points from low rib to the iliac crest bilaterally dull ache.    B sideflexion caused LBP,  50 cm digit III to floor Bilateral at the onset of pain   Goal status: Partially met  ( L 44 cm, R 45 cm before pain) sideflexion AROM increased but LBP still present with tasks    Pia Lupe Plump, PT 01/15/2024, 3:34 PM

## 2024-01-16 DIAGNOSIS — H6123 Impacted cerumen, bilateral: Secondary | ICD-10-CM | POA: Diagnosis not present

## 2024-01-16 DIAGNOSIS — K219 Gastro-esophageal reflux disease without esophagitis: Secondary | ICD-10-CM | POA: Diagnosis not present

## 2024-01-20 ENCOUNTER — Other Ambulatory Visit (HOSPITAL_COMMUNITY): Payer: Self-pay

## 2024-01-20 ENCOUNTER — Other Ambulatory Visit: Payer: Self-pay

## 2024-01-21 ENCOUNTER — Ambulatory Visit: Admitting: Physical Therapy

## 2024-01-27 ENCOUNTER — Ambulatory Visit: Admitting: Dermatology

## 2024-01-27 VITALS — BP 99/64 | HR 93

## 2024-01-27 DIAGNOSIS — D2239 Melanocytic nevi of other parts of face: Secondary | ICD-10-CM

## 2024-01-27 DIAGNOSIS — O4440 Low lying placenta NOS or without hemorrhage, unspecified trimester: Secondary | ICD-10-CM | POA: Insufficient documentation

## 2024-01-27 DIAGNOSIS — L299 Pruritus, unspecified: Secondary | ICD-10-CM | POA: Insufficient documentation

## 2024-01-27 DIAGNOSIS — Z1283 Encounter for screening for malignant neoplasm of skin: Secondary | ICD-10-CM | POA: Diagnosis not present

## 2024-01-27 DIAGNOSIS — B078 Other viral warts: Secondary | ICD-10-CM | POA: Insufficient documentation

## 2024-01-27 DIAGNOSIS — L813 Cafe au lait spots: Secondary | ICD-10-CM | POA: Insufficient documentation

## 2024-01-27 DIAGNOSIS — D229 Melanocytic nevi, unspecified: Secondary | ICD-10-CM

## 2024-01-27 DIAGNOSIS — W908XXA Exposure to other nonionizing radiation, initial encounter: Secondary | ICD-10-CM | POA: Diagnosis not present

## 2024-01-27 DIAGNOSIS — D1801 Hemangioma of skin and subcutaneous tissue: Secondary | ICD-10-CM

## 2024-01-27 DIAGNOSIS — D485 Neoplasm of uncertain behavior of skin: Secondary | ICD-10-CM

## 2024-01-27 DIAGNOSIS — Z148 Genetic carrier of other disease: Secondary | ICD-10-CM | POA: Insufficient documentation

## 2024-01-27 DIAGNOSIS — D2271 Melanocytic nevi of right lower limb, including hip: Secondary | ICD-10-CM | POA: Diagnosis not present

## 2024-01-27 DIAGNOSIS — Z8759 Personal history of other complications of pregnancy, childbirth and the puerperium: Secondary | ICD-10-CM | POA: Insufficient documentation

## 2024-01-27 DIAGNOSIS — L578 Other skin changes due to chronic exposure to nonionizing radiation: Secondary | ICD-10-CM

## 2024-01-27 DIAGNOSIS — L821 Other seborrheic keratosis: Secondary | ICD-10-CM | POA: Diagnosis not present

## 2024-01-27 DIAGNOSIS — L814 Other melanin hyperpigmentation: Secondary | ICD-10-CM | POA: Diagnosis not present

## 2024-01-27 DIAGNOSIS — N9089 Other specified noninflammatory disorders of vulva and perineum: Secondary | ICD-10-CM | POA: Insufficient documentation

## 2024-01-27 DIAGNOSIS — D239 Other benign neoplasm of skin, unspecified: Secondary | ICD-10-CM

## 2024-01-27 DIAGNOSIS — D2272 Melanocytic nevi of left lower limb, including hip: Secondary | ICD-10-CM

## 2024-01-27 NOTE — Patient Instructions (Addendum)

## 2024-01-27 NOTE — Progress Notes (Signed)
 New Patient Visit   Subjective  Samantha Bailey is a 33 y.o. female who presents for the following: Total Body Skin Exam (TBSE)  Patient present today for new patient visit for TBSE.The patient reports she has spots, moles and lesions to be evaluated, some may be new or changing and the patient may have concern these could be cancer. Patient has previously been treated by a dermatologist.Patient reports she does not have hx of bx. Patient reports family history of skin cancers (Dad unsure of Type). Patient reports throughout her lifetime has had moderate sun exposure. Currently, patient reports if she has excessive sun exposure, they does apply sunscreen and/or wears protective coverings. Patient reports she in not actively pregnant, trying to conceive or nursing.   The following portions of the chart were reviewed this encounter and updated as appropriate: medications, allergies, medical history  Review of Systems:  No other skin or systemic complaints except as noted in HPI or Assessment and Plan.  Objective  Well appearing patient in no apparent distress; mood and affect are within normal limits.  A full examination was performed including scalp, head, eyes, ears, nose, lips, neck, chest, axillae, abdomen, back, buttocks, bilateral upper extremities, bilateral lower extremities, hands, feet, fingers, toes, fingernails, and toenails. All findings within normal limits unless otherwise noted below.   Relevant exam findings are noted in the Assessment and Plan. 3 mm erythematous papule alar rim fibrous papule vs BCC C Left Anterior Thigh  Left Ala Nasi 3 mm erythematous papule Right Inner Thigh 4 mm Brown macule with Black Speckle Left Thigh - Anterior 3 mm brown macule with area of pigment regression  Assessment & Plan   LENTIGINES, SEBORRHEIC KERATOSES, HEMANGIOMAS - Benign normal skin lesions - Benign-appearing - Call for any changes  MELANOCYTIC NEVI - Tan-brown and/or  pink-flesh-colored symmetric macules and papules - Benign appearing on exam today - Observation - Call clinic for new or changing moles - Recommend daily use of broad spectrum spf 30+ sunscreen to sun-exposed areas.   MILD ACTINIC DAMAGE - Chronic condition, secondary to cumulative UV/sun exposure - diffuse scaly erythematous macules with underlying dyspigmentation - Recommend daily broad spectrum sunscreen SPF 30+ to sun-exposed areas, reapply every 2 hours as needed.  - Staying in the shade or wearing long sleeves, sun glasses (UVA+UVB protection) and wide brim hats (4-inch brim around the entire circumference of the hat) are also recommended for sun protection.  - Call for new or changing lesions.  DERMATOFIBROMA Exam: Firm pink/brown papulenodule with dimple sign.  Treatment Plan: A dermatofibroma is a benign growth possibly related to trauma, such as an insect bite, cut from shaving, or inflamed acne-type bump.  Treatment options to remove include shave or excision with resulting scar and risk of recurrence.  Since benign-appearing and not bothersome, will observe for now.   SKIN CANCER SCREENING PERFORMED TODAY  NEOPLASM OF UNCERTAIN BEHAVIOR OF SKIN (3) Left Ala Nasi Epidermal / dermal shaving  Lesion diameter (cm):  0.3 Informed consent: discussed and consent obtained   Timeout: patient name, date of birth, surgical site, and procedure verified   Procedure prep:  Patient was prepped and draped in usual sterile fashion Prep type:  Isopropyl alcohol Anesthesia: the lesion was anesthetized in a standard fashion   Anesthetic:  1% lidocaine  w/ epinephrine  1-100,000 buffered w/ 8.4% NaHCO3 Instrument used: DermaBlade   Hemostasis achieved with: aluminum chloride   Outcome: patient tolerated procedure well   Post-procedure details: sterile dressing applied and wound  care instructions given   Dressing type: petrolatum   Additional details:  Pt aware that benign results will be  sent to mychart and the staff will call abnormal results will  Specimen A - Surgical pathology Differential Diagnosis: Fibrous Papule vs BCC  Check Margins: No Right Inner Thigh Epidermal / dermal shaving  Lesion diameter (cm):  0.4 Informed consent: discussed and consent obtained   Timeout: patient name, date of birth, surgical site, and procedure verified   Procedure prep:  Patient was prepped and draped in usual sterile fashion Prep type:  Isopropyl alcohol Anesthesia: the lesion was anesthetized in a standard fashion   Anesthetic:  1% lidocaine  w/ epinephrine  1-100,000 buffered w/ 8.4% NaHCO3 Instrument used: DermaBlade   Hemostasis achieved with: aluminum chloride   Outcome: patient tolerated procedure well   Post-procedure details: sterile dressing applied and wound care instructions given   Dressing type: petrolatum   Additional details:  Pt aware that benign results will be sent to mychart and the staff will call abnormal results will  Specimen B - Surgical pathology Differential Diagnosis: R/O DN  Check Margins: No Left Thigh - Anterior Epidermal / dermal shaving  Lesion diameter (cm):  0.3 Informed consent: discussed and consent obtained   Timeout: patient name, date of birth, surgical site, and procedure verified   Procedure prep:  Patient was prepped and draped in usual sterile fashion Prep type:  Isopropyl alcohol Anesthesia: the lesion was anesthetized in a standard fashion   Anesthetic:  1% lidocaine  w/ epinephrine  1-100,000 buffered w/ 8.4% NaHCO3 Instrument used: DermaBlade   Hemostasis achieved with: aluminum chloride   Outcome: patient tolerated procedure well   Post-procedure details: sterile dressing applied and wound care instructions given   Dressing type: petrolatum   Additional details:  Pt aware that benign results will be sent to mychart and the staff will call abnormal results will  Specimen C - Surgical pathology Differential Diagnosis: R/O  DN  Check Margins: No  Return in about 1 year (around 01/26/2025) for TBSE.  I, Jakia Kennebrew, am acting as Neurosurgeon for [employer], DO.  Documentation: I have reviewed the above documentation for accuracy and completeness, and I agree with the above.  Delon Lenis, DO

## 2024-01-28 LAB — SURGICAL PATHOLOGY

## 2024-02-02 ENCOUNTER — Encounter: Payer: Self-pay | Admitting: Obstetrics and Gynecology

## 2024-02-03 ENCOUNTER — Other Ambulatory Visit: Payer: Self-pay

## 2024-02-05 ENCOUNTER — Ambulatory Visit: Payer: Self-pay | Admitting: Dermatology

## 2024-02-09 ENCOUNTER — Encounter: Payer: Self-pay | Admitting: Dermatology

## 2024-02-12 ENCOUNTER — Other Ambulatory Visit: Payer: Self-pay

## 2024-02-12 ENCOUNTER — Ambulatory Visit: Admitting: Physical Therapy

## 2024-02-19 ENCOUNTER — Ambulatory Visit: Admitting: Physical Therapy

## 2024-02-19 ENCOUNTER — Ambulatory Visit (INDEPENDENT_AMBULATORY_CARE_PROVIDER_SITE_OTHER): Admitting: Family Medicine

## 2024-02-25 ENCOUNTER — Ambulatory Visit: Admitting: Physical Therapy

## 2024-03-04 ENCOUNTER — Ambulatory Visit: Attending: Obstetrics and Gynecology | Admitting: Physical Therapy

## 2024-03-04 DIAGNOSIS — R293 Abnormal posture: Secondary | ICD-10-CM | POA: Diagnosis not present

## 2024-03-04 DIAGNOSIS — M533 Sacrococcygeal disorders, not elsewhere classified: Secondary | ICD-10-CM | POA: Diagnosis not present

## 2024-03-04 DIAGNOSIS — M5459 Other low back pain: Secondary | ICD-10-CM | POA: Insufficient documentation

## 2024-03-04 DIAGNOSIS — R2689 Other abnormalities of gait and mobility: Secondary | ICD-10-CM | POA: Diagnosis not present

## 2024-03-04 NOTE — Therapy (Signed)
 OUTPATIENT PHYSICAL THERAPY TREATMENT   / Recert    Patient Name: Samantha Bailey MRN: [id number] DOB:December 18, 1990, 33 y.o., female Today's Date: 03/04/2024   PT End of Session - 03/04/24 1721     Visit Number 8    Number of Visits 28    Date for Recertification  05/13/24    PT Start Time 1500    PT Stop Time 1545    PT Time Calculation (min) 45 min    Activity Tolerance Patient tolerated treatment well;No increased pain    Behavior During Therapy WFL for tasks assessed/performed            Past Medical History:  Diagnosis Date   Allergic rhinitis    Chlamydia 2008   Infertility, female    Medical history non-contributory    Multiple food allergies    PCOS (polycystic ovarian syndrome)    Pregnancy induced hypertension    Past Surgical History:  Procedure Laterality Date   IR ANGIOGRAM SELECTIVE EACH ADDITIONAL VESSEL  06/20/2019   IR ANGIOGRAM SELECTIVE EACH ADDITIONAL VESSEL  06/20/2019   IR ANGIOGRAM SELECTIVE EACH ADDITIONAL VESSEL  06/20/2019   IR ANGIOGRAM SELECTIVE EACH ADDITIONAL VESSEL  06/20/2019   IR ANGIOGRAM SELECTIVE EACH ADDITIONAL VESSEL  06/20/2019   IR ANGIOGRAM SELECTIVE EACH ADDITIONAL VESSEL  06/20/2019   IR ANGIOGRAM SELECTIVE EACH ADDITIONAL VESSEL  06/20/2019   IR ANGIOGRAM VISCERAL SELECTIVE  06/20/2019   IR EMBO ART  VEN HEMORR LYMPH EXTRAV  INC GUIDE ROADMAPPING  06/20/2019   IR US  GUIDE VASC ACCESS RIGHT  06/20/2019   WISDOM TOOTH EXTRACTION  2013   no problems with anesthesia   Patient Active Problem List   Diagnosis Date Noted   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 01/27/2024   History of gestational hypertension 01/27/2024   Lentigo 01/27/2024   Lesion of labia 01/27/2024   Low-lying placenta 01/27/2024   Other viral warts 01/27/2024   Pruritic disorder 01/27/2024   Spot, cafe-au-lait 01/27/2024   Sun-damaged skin 01/27/2024   Recurrent major depressive disorder, in full remission 01/01/2024   SUI (stress urinary incontinence, female)  10/17/2023   Dyspareunia, female 10/17/2023   Vitamin D  deficiency 07/15/2023   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   Class 1 obesity with serious comorbidity and body mass index (BMI) of 32.0 to 32.9 in adult 06/17/2023   Shellfish allergy  06/17/2023   PCOS (polycystic ovarian syndrome) 06/17/2023   Numerous moles 06/17/2023   History of infertility 06/17/2023   Prolapse of posterior vaginal wall 06/17/2023   Pregnancy 04/24/2023   Vaginal delivery 09/19/2021   Gestational hypertension 09/18/2021   Carrier of group B Streptococcus 09/10/2021   Closed fracture of body of right scapula 07/06/2019   Liver laceration, closed 06/20/2019   Acne rosacea, papular type 10/12/2015   Allergic rhinitis 10/12/2015    PCP: Wellington Beams FNP   REFERRING PROVIDER: Marne Marseille MD   REFERRING DIAG: rectocele   Rationale for Evaluation and Treatment Rehabilitation  THERAPY DIAG:  Sacrococcygeal disorders, not elsewhere classified  Other abnormalities of gait and mobility  Abnormal posture  Other low back pain  Other low back pain   ONSET DATE: 6 months ago after  delivering 2 child   SUBJECTIVE:                                               SUBJECTIVE STATEMENT  TODAY:  Pt reported she notices prolapse end of the day but not worse  SUBJECTIVE STATEMENT ON EVAL 10/16/23 : 1) rectocele started 6 months ago after delivering 2nd child.  Excessive pushing was needed for her 10 lb baby . Son had shoulder dystocia. Pt felt a bulge out of her vagina. It is not hanging out but it is visible. When she inserts a tampon, she can feel the rectal wall and it makes her feel she has to poop. BMs occur daily without straining. With urination, pt has to go again with the feeling of not finishing. No straining with urination.  During sexual intercourse, the prolapse causes discomfort and painful in certain positions some of the time.    2) LBP occurred 5 years ago. Persisted during 1st pregnancy 3 years  ago but not worsen due pregnancy. Radiating pain down buttocks on R. Pt tried chiropractor Tx but it did not help. 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 It occurred often during HIIT classes.  Since this 2nd pregnancy, LBP increased from 3/10 to 5/10. Radiating pain to buttocks only occur with tweaking back, lifting , bending in the wrong way.  Pain occurs with lifting children ( 6 months old , 22 lbs, 2 years ago - 30 lbs)   Pt points from low rib to the iliac crest bilaterally dull ache.    3 days of 12 hours work shift as a [employer] the past 5 years.     PERTINENT HISTORY:    PAIN:  Are you having pain? Yes: see above  PRECAUTIONS: No  WEIGHT BEARING RESTRICTIONS: No  FALLS:  Has patient fallen in last 6 months? No  LIVING ENVIRONMENT: Lives with: family   Lives in: two story  Stairs: STE 2 with rail    OCCUPATION: RN   PLOF: IND   PATIENT GOALS:  LBP better managed and not worsen prolapse    OBJECTIVE:    Pelvic Floor Special Questions - 03/04/24 1722     Prolapse Posterior Wall   standing iwthin introitus, hooklying   Pelvic Floor Internal Exam pt consented verbally, without contra indications,    Exam Type Vaginal    Palpation pain, tightness along 7 o'clock,  1-6 o'clock 1-2 layer,  tightness and pain at ischiococcygeus L, obt int L .    Strength weak squeeze, no lift    Biofeedback tactile digital cues for elicit upward movement of pelvic floor, decrease overuse of ab          [facility] Adult PT Treatment/Exercise - 03/04/24 1725       Neuro Re-ed    Neuro Re-ed Details  propioeptive cues for pelvic floor coordination with ab mm to minimize downward movement with ab overuse and optiomize upward movement of pelvic floor , propioceptive cues for [facility] co-activation to lrelax pelvic floor and relationship with toe clenching with flip flop wearing on tight pelvic floor mm      Manual Therapy   Manual therapy comments STM/MWM at problem  areas noted in assessment to promote pelvic floor lengthening and more upward lift              HOME EXERCISE PROGRAM: See pt instruction section    ASSESSMENT:  CLINICAL IMPRESSION: Pt met 3/5 goals and progressing well towards remaining goals.   Improvements include:  alignment of spine/ pelvis restored to levelled alignment  less  rounded shoulders, hypomobility of C/T junction which will further minimize straining pelvic floor/  worsening prolapse.  Pt is practicing techniques with use of LKC and less straining of pelvic floor with patient handling as a RN which will help minimize prolapse and straining to pelvic floor  Improved coordination with deep core level 1-2 training.  : pt no longer showed flexed coccyx but still required manual Tx to improve R hypombile SIJ.   Performed internal pelvic floor assessment which showed lowered posterior vaginal wall in standing and hooklying, tightness of L pelvic floor mm > R, dyscoordination of pelvic floor activation. Manual Tx helped to minimzie tightness and pain. Modified manual Tx to accommodate comfort.  Provided propioceptive cues for pelvic floor coordination with ab mm to minimize downward movement with ab overuse and optiomize upward movement of pelvic floor , propioceptive cues for [facility] co-activation to lrelax pelvic floor and relationship with toe clenching with flip flop wearing on tight pelvic floor mm   Plan to continue with internal pelvic Tx and withholding kegel contraction training until pt shows no more pelvic floor tightness  Anticipate these improvements will promote optimize IAP system for improved pelvic floor function, trunk stability, gait, balance, stabilization with mobility tasks.     Regional interdependent approaches will yield greater benefits in pt's POC                                                 Pt benefits from skilled PT.   OBJECTIVE IMPAIRMENTS decreased activity tolerance, decreased  coordination, decreased endurance, decreased mobility, difficulty walking, decreased ROM, decreased strength, decreased safety awareness, hypomobility, increased muscle spasms, impaired flexibility, improper body mechanics, postural dysfunction, and pain.    ACTIVITY LIMITATIONS  self-care,   work tasks as a engineer, civil (consulting)    PARTICIPATION LIMITATIONS:  community, gym activities ( used to do HIIT classes, [employer])    PERSONAL FACTORS   affecting patient's functional outcome:    REHAB POTENTIAL: Good   CLINICAL DECISION MAKING: Evolving/moderate complexity   EVALUATION COMPLEXITY: Moderate    PATIENT EDUCATION:    Education details: Showed pt anatomy images. Explained muscles attachments/ connection, physiology of deep core system/ spinal- thoracic-pelvis-lower kinetic chain as they relate to pt's presentation, Sx, and past Hx. Explained what and how these areas of deficits need to be restored to balance and function    See Therapeutic activity / neuromuscular re-education section  Answered pt's questions.   Person educated: Patient Education method: Explanation, Demonstration, Tactile cues, Verbal cues, and Handouts Education comprehension: verbalized understanding, returned demonstration, verbal cues required, tactile cues required, and needs further education     PLAN: PT FREQUENCY: 1x/week   PT DURATION: 10 weeks   PLANNED INTERVENTIONS:   Gait training;Stair training;Functional mobility training;DME Instruction;Therapeutic activities;Therapeutic exercise;Balance training;Neuromuscular re-education;Patient/family education;Vestibular;Visual/perceptual remediation/compensation;Passive range of motion;Moist Heat;Cryotherapy;Traction;Canalith Repostioning;Joint Manipulations;Manual lymph drainage;Manual techniques;Scar mobilization;Energy conservation;Dry needling;ADLs/Self Care Home Management;Biofeedback;Electrical Stimulation;Taping    PLAN FOR NEXT SESSION: See clinical  impression for plan     GOALS: Goals reviewed with patient? Yes  SHORT TERM GOALS: Target date: 11/13/2023    Pt will demo IND with HEP                    Baseline: Not IND            Goal status: INITIAL   LONG TERM GOALS: Target date:05/13/2024     1.Pt will demo proper deep core coordination  without chest breathing and optimal excursion of diaphragm/pelvic floor in order to promote spinal stability and pelvic floor function  Baseline: dyscoordination Goal status:  MET  2.  Pt will demo proper body mechanics in against gravity tasks and ADLs  work tasks, fitness  to minimize straining pelvic floor / back    Baseline: not IND, improper form that places strain on pelvic floor  Goal status: MET     3. Pt will demo increased gait speed > 1.3 m/s with reciprocal gait pattern, longer stride length  in order to ambulate safely in community and return to fitness routine  Baseline: minimal posterior rotation of L pelvis, R shoulder, 1.24 m/s  Goal status:     4. Pt will demo levelled pelvic girdle and shoulder height in order to progress to deep core strengthening HEP and restore mobility at spine, pelvis, gait, posture minimize falls, and improve balance  Baseline: L shoulder , R iliac crest lowered, Goal status:  MET    5. Pt will improve PFDI-7 questionnaire to  pts  score change  to demo improved QOL  Baseline:    ( greater pts indicate greater negative impact on QOL)     pts  ( total)    pts  ( UIQ-7 )    pts  ( CRAIQ-7 )    pts  ( POPIQ-7 )  Baseline: plan to administer next session Goal status: Deferred , did not administer   6. Pt will report LBP decreased < 1/10 and no radiating pain with lifting, bending at home, work  and demo  no LBP with B sideflexion in order to restore ADLs, work tasks  Baseline:  LBP occurred 5 years ago  before first pregnancy. Radiating pain down buttocks on R. Pt tried chiropractor Tx but it did not help. 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 It occurred often during HIIT classes.  Since this 2nd pregnancy, LBP increased from 3/10 to 5/10. Radiating pain to buttocks only occur with tweaking back, lifting , bending in the wrong way. Pt points from low rib to the iliac crest bilaterally dull ache.    B sideflexion caused LBP,  50 cm digit III to floor Bilateral at the onset of pain   Goal status: Partially met  ( L 44 cm, R 45 cm before pain) sideflexion AROM increased but LBP still present with tasks    Pia Lupe Plump, PT 03/04/2024, 5:21 PM

## 2024-03-16 ENCOUNTER — Telehealth: Payer: Self-pay | Admitting: Physical Therapy

## 2024-03-16 ENCOUNTER — Ambulatory Visit: Admitting: Physical Therapy

## 2024-03-16 NOTE — Telephone Encounter (Signed)
 Physical therapist called pt re: missed appt today. Provided encouragement to keep up with HEP.  Plan to keep pt on cancellation list for Dec appts.

## 2024-03-19 ENCOUNTER — Other Ambulatory Visit (HOSPITAL_COMMUNITY): Payer: Self-pay

## 2024-03-19 ENCOUNTER — Other Ambulatory Visit: Payer: Self-pay

## 2024-03-29 ENCOUNTER — Encounter: Payer: Self-pay | Admitting: "Endocrinology

## 2024-03-29 DIAGNOSIS — Z6832 Body mass index (BMI) 32.0-32.9, adult: Secondary | ICD-10-CM

## 2024-04-01 ENCOUNTER — Other Ambulatory Visit: Payer: Self-pay

## 2024-04-01 DIAGNOSIS — E282 Polycystic ovarian syndrome: Secondary | ICD-10-CM

## 2024-04-01 NOTE — Addendum Note (Signed)
 Addended by: Hansini Clodfelter on: 04/01/2024 11:35 AM   Modules accepted: Orders

## 2024-04-02 ENCOUNTER — Other Ambulatory Visit

## 2024-04-02 DIAGNOSIS — E282 Polycystic ovarian syndrome: Secondary | ICD-10-CM | POA: Diagnosis not present

## 2024-04-02 LAB — TSH+FREE T4: TSH W/REFLEX TO FT4: 1.94 m[IU]/L

## 2024-04-03 LAB — LIPID PANEL
Cholesterol: 139 mg/dL (ref <200)
HDL: 54 mg/dL (ref >=50)
LDL Cholesterol (Calc): 69 mg/dL
Non-HDL Cholesterol (Calc): 85 mg/dL (ref <130)
Total CHOL/HDL Ratio: 2.6 (calc) (ref <5.0)
Triglycerides: 78 mg/dL (ref <150)

## 2024-04-03 LAB — HEMOGLOBIN A1C
Hgb A1c MFr Bld: 5.1 % (ref <5.7)
Mean Plasma Glucose: 100 mg/dL
eAG (mmol/L): 5.5 mmol/L

## 2024-04-08 ENCOUNTER — Telehealth (INDEPENDENT_AMBULATORY_CARE_PROVIDER_SITE_OTHER): Admitting: "Endocrinology

## 2024-04-08 ENCOUNTER — Encounter: Payer: Self-pay | Admitting: "Endocrinology

## 2024-04-08 VITALS — Ht 62.0 in | Wt 179.0 lb

## 2024-04-08 DIAGNOSIS — E6609 Other obesity due to excess calories: Secondary | ICD-10-CM | POA: Diagnosis not present

## 2024-04-08 DIAGNOSIS — E66811 Obesity, class 1: Secondary | ICD-10-CM | POA: Diagnosis not present

## 2024-04-08 DIAGNOSIS — Z6832 Body mass index (BMI) 32.0-32.9, adult: Secondary | ICD-10-CM

## 2024-04-08 DIAGNOSIS — E282 Polycystic ovarian syndrome: Secondary | ICD-10-CM | POA: Diagnosis not present

## 2024-04-08 MED ORDER — TIRZEPATIDE-WEIGHT MANAGEMENT 5 MG/0.5ML ~~LOC~~ SOLN: 5.0000 mg | SUBCUTANEOUS | 0 refills | Status: DC

## 2024-04-08 NOTE — Progress Notes (Addendum)
 "  The patient reports they are currently: [name]. It was a video visit with the patient on the date of service.  The patient and physician were physically located in [place]  or a state in which I am permitted to provide care. The patient and/or parent/guardian understood that s/he may incur co-pays and cost sharing, and agreed to the telemedicine visit. The visit was reasonable and appropriate under the circumstances given the patient's presentation at the time.  The patient and/or parent/guardian understands the potential risks and limitations of this mode of treatment (including, but not limited to, the absence of in-person examination) and has agreed to be treated using telemedicine. The patient's/patient's family's questions regarding telemedicine have been answered.   The patient and/or parent/guardian will contact their provider's office for worsening conditions, and seek emergency medical treatment and/or call 911 if the patient deems either necessary.    Outpatient Endocrinology Note Samantha Birmingham, MD    Samantha Bailey June 10, 1990 [id number]  Referring Provider: No ref. provider found Primary Care Provider: [name], FNP (Inactive) Reason for consultation: Subjective   Assessment & Plan  Diagnoses and all orders for this visit:  PCOS (polycystic ovarian syndrome)  Class 1 obesity due to excess calories without serious comorbidity with body mass index (BMI) of 32.0 to 32.9 in adult  Other orders -     tirzepatide  5 MG/0.5ML injection vial; Inject 5 mg into the skin once a week.     Patient was diagnosed with PCOS primary OB/GYN during her fertility treatment Currently has menstrual cycle every 5 weeks Patient has a child through IVF and then had a normal spontaneous pregnancy 04/08/24: Patient is on Wegovy /semaglutide  2.4mg /week. Wants to switch to Zepbound /tirzepatide , sent prescription for 5 mg weekly.  She lost about 15 pounds on Wegovy .  Current  weight is 179 pounds. Otherwise metabolically doing healthy, following 1300 cal a day No history of MEN syndrome/medullary thyroid  cancer/pancreatitis or pancreatic cancer in self or family Recommend annual preventative labs with PCP  Return in about 29 days (around 05/07/2024).   I have reviewed current medications, nurse's notes, allergies, vital signs, past medical and surgical history, family medical history, and social history for this encounter. Counseled patient on symptoms, examination findings, lab findings, imaging results, treatment decisions and monitoring and prognosis. The patient understood the recommendations and agrees with the treatment plan. All questions regarding treatment plan were fully answered.  Samantha Birmingham, MD  04/08/2024   History of Present Illness HPI  Samantha Bailey is a 33 y.o. year old female who presents for evaluation of PCOS.   Maintaining weight with slow loss on her own Tries to count calories: she aims for 1300 Cal/day Usually works out once a week  On Wegovy  2.4 mg weekly-been on it for a year and lost 15 lbs;  has no side effects   Initial history:   Patient was diagnosed with PCOS primary OB/GYN during her fertility treatment: Reports diagnosed with diagnosis of multiple follicles ovary on ultrasound as well as elevated testosterone . Currently has menstrual cycle every 5 weeks.  Patient has a child through IVF and then had a normal spontaneous pregnancy.  Patient is following with another physician for weight loss and is on Wegovy , low-dose currently.  Otherwise metabolically doing healthy, following 1200 cal a day.  Component     Latest Ref Rng 06/17/2023 06/24/2023  Glucose     70 - 99 mg/dL 66 (L)    BUN     6 -  20 mg/dL 9    Creatinine     9.42 - 1.00 mg/dL 9.21    eGFR     >40 fO/fpw/8.26 105    BUN/Creatinine Ratio     9 - 23  12    Sodium     134 - 144 mmol/L 141    Potassium     3.5 - 5.2 mmol/L 4.4     Chloride     96 - 106 mmol/L 102    CO2     20 - 29 mmol/L 22    Calcium     8.7 - 10.2 mg/dL 9.8    Total Protein     6.0 - 8.5 g/dL 7.8    Albumin     4.0 - 5.0 g/dL 4.6    Globulin, Total     1.5 - 4.5 g/dL 3.2    Total Bilirubin     0.0 - 1.2 mg/dL 0.3    Alkaline Phosphatase     44 - 121 IU/L 83    AST     0 - 40 IU/L 27    ALT     0 - 32 IU/L 37 (H)    Cholesterol, Total     100 - 199 mg/dL 840    Triglycerides     0 - 149 mg/dL 891    HDL Cholesterol     >39 mg/dL 53    VLDL Cholesterol Cal     5 - 40 mg/dL 20    LDL Chol Calc (NIH)     0 - 99 mg/dL 86    Total CHOL/HDL Ratio     0.0 - 4.4 ratio 3.0    Hemoglobin A1C     4.8 - 5.6 % 5.4    Est. average glucose Bld gHb Est-mCnc     mg/dL 891    U5,Qmzz(Ipmzru)     0.82 - 1.77 ng/dL  8.78   Triiodothyronine (T3)     71 - 180 ng/dL  876   INSULIN      2.6 - 24.9 uIU/mL  6.6   TSH     0.450 - 4.500 uIU/mL  1.390      Physical Exam  Ht 5' 2 (1.575 m)   Wt 179 lb (81.2 kg)   BMI 32.74 kg/m    Constitutional: well developed, well nourished Head: normocephalic, atraumatic Eyes: sclera anicteric, no redness Neck: supple Lungs: normal respiratory effort Neurology: alert and oriented Skin: dry, no appreciable rashes Musculoskeletal: no appreciable defects Psychiatric: normal mood and affect   Current Medications Patient's Medications  New Prescriptions   TIRZEPATIDE  5 MG/0.5ML INJECTION VIAL    Inject 5 mg into the skin once a week.  Previous Medications   AZELASTINE  (ASTELIN ) 0.1 % NASAL SPRAY    Place 1-2 sprays into both nostrils 2 (two) times daily as needed (nasal drainage). Use in each nostril as directed   EPINEPHRINE  0.3 MG/0.3 ML IJ SOAJ INJECTION    Inject 0.3 mg into the muscle as needed for anaphylaxis.   FLUTICASONE  (FLONASE ) 50 MCG/ACT NASAL SPRAY    Place 1-2 sprays into both nostrils daily as needed (nasal congestion).   MOMETASONE  (ELOCON ) 0.1 % LOTION    Apply 3-5 drops in the ears  2 (two) times a week at bedtime.   SCOPOLAMINE  (TRANSDERM-SCOP) 1 MG/3DAYS    Place 1 patch (1 mg total) onto the skin every 3 (three) days.   SERTRALINE  (ZOLOFT ) 50 MG TABLET    Take 1  tablet (50 mg total) by mouth daily.   VITAMIN D , ERGOCALCIFEROL , (DRISDOL ) 1.25 MG (50000 UNIT) CAPS CAPSULE    Take 1 capsule (50,000 Units total) by mouth every 7 (seven) days.  Modified Medications   No medications on file  Discontinued Medications   SEMAGLUTIDE -WEIGHT MANAGEMENT (WEGOVY ) 2.4 MG/0.75ML SOAJ SQ INJECTION    Inject 2.4 mg into the skin once a week.    Allergies Allergies  Allergen Reactions   Shellfish Allergy  Anaphylaxis   Shellfish Allergy  Swelling, Other (See Comments) and Itching    Past Medical History Past Medical History:  Diagnosis Date   Allergic rhinitis    Chlamydia 2008   Infertility, female    Medical history non-contributory    Multiple food allergies    PCOS (polycystic ovarian syndrome)    Pregnancy induced hypertension     Past Surgical History Past Surgical History:  Procedure Laterality Date   IR ANGIOGRAM SELECTIVE EACH ADDITIONAL VESSEL  06/20/2019   IR ANGIOGRAM SELECTIVE EACH ADDITIONAL VESSEL  06/20/2019   IR ANGIOGRAM SELECTIVE EACH ADDITIONAL VESSEL  06/20/2019   IR ANGIOGRAM SELECTIVE EACH ADDITIONAL VESSEL  06/20/2019   IR ANGIOGRAM SELECTIVE EACH ADDITIONAL VESSEL  06/20/2019   IR ANGIOGRAM SELECTIVE EACH ADDITIONAL VESSEL  06/20/2019   IR ANGIOGRAM SELECTIVE EACH ADDITIONAL VESSEL  06/20/2019   IR ANGIOGRAM VISCERAL SELECTIVE  06/20/2019   IR EMBO ART  VEN HEMORR LYMPH EXTRAV  INC GUIDE ROADMAPPING  06/20/2019   IR US  GUIDE VASC ACCESS RIGHT  06/20/2019   WISDOM TOOTH EXTRACTION  2013   no problems with anesthesia    Family History family history includes Alcoholism in her mother; Allergic rhinitis in her father; Angioedema in her father; Eczema in her mother; Food Allergy  in her father; Healthy in her maternal grandfather, maternal grandmother,  mother, paternal grandfather, and paternal grandmother; Hypertension in her father; Obesity in her mother; Varicose Veins in her mother.  Social History Social History   Socioeconomic History   Marital status: Married    Spouse name: Danyale Ridinger   Number of children: 2   Years of education: college   Highest education level: Associate degree: academic program  Occupational History    Employer: Bartender    Comment: [employer]   Occupation: student    Comment: Nursing School at [employer]   Occupation: RN  Tobacco Use   Smoking status: Never    Passive exposure: Never   Smokeless tobacco: Never  Vaping Use   Vaping status: Never Used  Substance and Sexual Activity   Alcohol use: No   Drug use: Never   Sexual activity: Yes    Partners: Male    Birth control/protection: Condom  Other Topics Concern   Not on file  Social History Narrative   ** Merged History Encounter **       Social Drivers of Health   Tobacco Use: Low Risk (04/08/2024)   Patient History    Smoking Tobacco Use: Never    Smokeless Tobacco Use: Never    Passive Exposure: Never  Financial Resource Strain: Low Risk (12/28/2023)   Overall Financial Resource Strain (CARDIA)    Difficulty of Paying Living Expenses: Not hard at all  Food Insecurity: No Food Insecurity (12/28/2023)   Epic    Worried About Radiation Protection Practitioner of Food in the Last Year: Never true    Ran Out of Food in the Last Year: Never true  Transportation Needs: No Transportation Needs (12/28/2023)   Epic  Lack of Transportation (Medical): No    Lack of Transportation (Non-Medical): No  Physical Activity: Insufficiently Active (12/28/2023)   Exercise Vital Sign    Days of Exercise per Week: 3 days    Minutes of Exercise per Session: 40 min  Stress: No Stress Concern Present (12/28/2023)   [employer] of Occupational Health - Occupational Stress Questionnaire    Feeling of Stress: Not at all  Social Connections: Moderately  Isolated (12/28/2023)   Social Connection and Isolation Panel    Frequency of Communication with Friends and Family: More than three times a week    Frequency of Social Gatherings with Friends and Family: More than three times a week    Attends Religious Services: Never    Database Administrator or Organizations: No    Attends Banker Meetings: Not on file    Marital Status: Married  Intimate Partner Violence: Not At Risk (04/24/2023)   Humiliation, Afraid, Rape, and Kick questionnaire    Fear of Current or Ex-Partner: No    Emotionally Abused: No    Physically Abused: No    Sexually Abused: No  Depression (PHQ2-9): Low Risk (06/24/2023)   Depression (PHQ2-9)    PHQ-2 Score: 0  Alcohol Screen: Low Risk (12/28/2023)   Alcohol Screen    Last Alcohol Screening Score (AUDIT): 2  Housing: Low Risk (12/28/2023)   Epic    Unable to Pay for Housing in the Last Year: No    Number of Times Moved in the Last Year: 0    Homeless in the Last Year: No  Utilities: Not At Risk (04/24/2023)   AHC Utilities    Threatened with loss of utilities: No  Health Literacy: Adequate Health Literacy (06/17/2023)   B1300 Health Literacy    Frequency of need for help with medical instructions: Never    Lab Results  Component Value Date   CHOL 139 04/02/2024   Lab Results  Component Value Date   HDL 54 04/02/2024   Lab Results  Component Value Date   LDLCALC 69 04/02/2024   Lab Results  Component Value Date   TRIG 78 04/02/2024   Lab Results  Component Value Date   CHOLHDL 2.6 04/02/2024   Lab Results  Component Value Date   CREATININE 0.78 06/17/2023   No results found for: GFR    Component Value Date/Time   NA 141 06/17/2023 1008   K 4.4 06/17/2023 1008   CL 102 06/17/2023 1008   CO2 22 06/17/2023 1008   GLUCOSE 66 (L) 06/17/2023 1008   GLUCOSE 94 04/17/2023 1605   BUN 9 06/17/2023 1008   CREATININE 0.78 06/17/2023 1008   CREATININE 0.75 02/03/2017 1155   CALCIUM 9.8  06/17/2023 1008   PROT 7.8 06/17/2023 1008   ALBUMIN 4.6 06/17/2023 1008   AST 27 06/17/2023 1008   ALT 37 (H) 06/17/2023 1008   ALKPHOS 83 06/17/2023 1008   BILITOT 0.3 06/17/2023 1008   GFRNONAA >60 04/17/2023 1605   GFRNONAA 112 02/03/2017 1155   GFRAA 116 07/19/2019 1448   GFRAA 129 02/03/2017 1155      Latest Ref Rng & Units 06/17/2023   10:08 AM 04/17/2023    4:05 PM 09/18/2021    4:44 PM  BMP  Glucose 70 - 99 mg/dL 66  94  85   BUN 6 - 20 mg/dL 9  9  7    Creatinine 0.57 - 1.00 mg/dL 9.21  9.47  9.46   BUN/Creat Ratio 9 -  23 12     Sodium 134 - 144 mmol/L 141  135  137   Potassium 3.5 - 5.2 mmol/L 4.4  3.9  3.9   Chloride 96 - 106 mmol/L 102  107  108   CO2 20 - 29 mmol/L 22  22  20    Calcium 8.7 - 10.2 mg/dL 9.8  9.4  9.0        Component Value Date/Time   WBC 3.7 06/17/2023 1008   WBC 13.5 (H) 04/25/2023 0428   RBC 5.17 06/17/2023 1008   RBC 3.86 (L) 04/25/2023 0428   HGB 13.6 06/17/2023 1008   HCT 43.1 06/17/2023 1008   PLT 360 06/17/2023 1008   MCV 83 06/17/2023 1008   MCH 26.3 (L) 06/17/2023 1008   MCH 27.5 04/25/2023 0428   MCHC 31.6 06/17/2023 1008   MCHC 33.1 04/25/2023 0428   RDW 13.6 06/17/2023 1008   LYMPHSABS 1.9 04/08/2018 0857   MONOABS 0.4 07/02/2011 1203   EOSABS 0.4 04/08/2018 0857   BASOSABS 0.1 04/08/2018 0857   Lab Results  Component Value Date   TSH 1.390 06/24/2023   TSH 3.340 04/08/2018   TSH 2.12 02/03/2017   FREET4 1.21 06/24/2023         Parts of this note may have been dictated using voice recognition software. There may be variances in spelling and vocabulary which are unintentional. Not all errors are proofread. Please notify the dino if any discrepancies are noted or if the meaning of any statement is not clear.   "

## 2024-04-16 ENCOUNTER — Other Ambulatory Visit (HOSPITAL_COMMUNITY): Payer: Self-pay

## 2024-04-26 ENCOUNTER — Other Ambulatory Visit: Payer: Self-pay

## 2024-04-26 MED ORDER — WEGOVY 2.4 MG/0.75ML ~~LOC~~ SOAJ
2.4000 mg | SUBCUTANEOUS | 0 refills | Status: AC
  Filled 2024-04-26 – 2024-05-17 (×6): qty 9, 84d supply, fill #0

## 2024-04-27 ENCOUNTER — Telehealth: Payer: Self-pay

## 2024-04-27 ENCOUNTER — Other Ambulatory Visit (HOSPITAL_COMMUNITY): Payer: Self-pay

## 2024-04-27 NOTE — Telephone Encounter (Signed)
 Pharmacy Patient Advocate Encounter   Received notification from Physician's Office that prior authorization for Wegovy  is required/requested.   Insurance verification completed.   The patient is insured through [facility].   Per test claim: Per test claim, medication is not covered due to plan/benefit exclusion, PA not submitted at this time EXCLUDED DRUG - EXCEPTION PROCESS NOT AVAILABLE. [employer] does not cover GLP-1s for weight loss

## 2024-04-28 ENCOUNTER — Telehealth (HOSPITAL_COMMUNITY): Payer: Self-pay

## 2024-04-28 ENCOUNTER — Other Ambulatory Visit (HOSPITAL_COMMUNITY): Payer: Self-pay

## 2024-04-29 ENCOUNTER — Other Ambulatory Visit: Payer: Self-pay

## 2024-04-29 ENCOUNTER — Other Ambulatory Visit (HOSPITAL_COMMUNITY): Payer: Self-pay

## 2024-04-30 ENCOUNTER — Other Ambulatory Visit (HOSPITAL_COMMUNITY): Payer: Self-pay

## 2024-04-30 ENCOUNTER — Other Ambulatory Visit: Payer: Self-pay

## 2024-05-10 ENCOUNTER — Other Ambulatory Visit (HOSPITAL_COMMUNITY): Payer: Self-pay

## 2024-05-11 ENCOUNTER — Telehealth (HOSPITAL_COMMUNITY): Payer: Self-pay

## 2024-05-11 ENCOUNTER — Other Ambulatory Visit (HOSPITAL_COMMUNITY): Payer: Self-pay

## 2024-05-12 ENCOUNTER — Other Ambulatory Visit (HOSPITAL_BASED_OUTPATIENT_CLINIC_OR_DEPARTMENT_OTHER): Payer: Self-pay

## 2024-05-12 ENCOUNTER — Other Ambulatory Visit (HOSPITAL_COMMUNITY): Payer: Self-pay

## 2024-05-12 NOTE — Telephone Encounter (Signed)
 PA submitted to [employer]. Key: [id number]

## 2024-05-17 NOTE — Telephone Encounter (Signed)
 Pharmacy Patient Advocate Encounter  Received notification from [facility] [employer] that Prior Authorization for wegovy  has been CANCELLED due to

## 2024-05-18 ENCOUNTER — Other Ambulatory Visit: Payer: Self-pay

## 2024-05-18 ENCOUNTER — Other Ambulatory Visit (HOSPITAL_COMMUNITY): Payer: Self-pay

## 2024-07-09 ENCOUNTER — Ambulatory Visit: Admitting: Family Medicine

## 2024-07-23 ENCOUNTER — Ambulatory Visit: Admitting: Family Medicine

## 2025-01-27 ENCOUNTER — Ambulatory Visit: Admitting: Dermatology
